# Patient Record
Sex: Female | Born: 1995 | Race: Black or African American | Hispanic: No | Marital: Single | Smoking: Never smoker
Health system: Southern US, Community
[De-identification: ages and names within clinical notes are randomized; demographics above are authoritative.]

## PROBLEM LIST (undated history)

## (undated) ENCOUNTER — Ambulatory Visit: Admission: EM | Payer: Managed Care, Other (non HMO) | Source: Home / Self Care

## (undated) ENCOUNTER — Inpatient Hospital Stay (HOSPITAL_COMMUNITY): Payer: Self-pay

## (undated) ENCOUNTER — Ambulatory Visit: Admission: EM | Source: Home / Self Care

## (undated) DIAGNOSIS — J45909 Unspecified asthma, uncomplicated: Secondary | ICD-10-CM

## (undated) DIAGNOSIS — N189 Chronic kidney disease, unspecified: Secondary | ICD-10-CM

## (undated) DIAGNOSIS — I10 Essential (primary) hypertension: Secondary | ICD-10-CM

## (undated) HISTORY — PX: TONSILLECTOMY: SUR1361

## (undated) HISTORY — DX: Essential (primary) hypertension: I10

---
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
## 2015-08-05 ENCOUNTER — Other Ambulatory Visit: Payer: Self-pay | Admitting: Family

## 2015-08-05 DIAGNOSIS — R102 Pelvic and perineal pain: Secondary | ICD-10-CM

## 2015-08-10 ENCOUNTER — Other Ambulatory Visit: Payer: Medicaid Other

## 2016-03-10 ENCOUNTER — Encounter (HOSPITAL_COMMUNITY): Payer: Self-pay | Admitting: Emergency Medicine

## 2016-03-10 ENCOUNTER — Emergency Department (HOSPITAL_COMMUNITY)
Admission: EM | Admit: 2016-03-10 | Discharge: 2016-03-10 | Disposition: A | Discharge status: Home / Self Care | Payer: Medicaid Other | Attending: Emergency Medicine | Admitting: Emergency Medicine

## 2016-03-10 ENCOUNTER — Emergency Department (HOSPITAL_COMMUNITY): Payer: Medicaid Other

## 2016-03-10 DIAGNOSIS — R000 Tachycardia, unspecified: Secondary | ICD-10-CM | POA: Insufficient documentation

## 2016-03-10 DIAGNOSIS — R062 Wheezing: Secondary | ICD-10-CM | POA: Insufficient documentation

## 2016-03-10 DIAGNOSIS — R05 Cough: Secondary | ICD-10-CM | POA: Insufficient documentation

## 2016-03-10 DIAGNOSIS — R0602 Shortness of breath: Secondary | ICD-10-CM | POA: Insufficient documentation

## 2016-03-10 LAB — D-DIMER, QUANTITATIVE (NOT AT ARMC)

## 2016-03-10 MED ORDER — ALBUTEROL SULFATE (2.5 MG/3ML) 0.083% IN NEBU
5.0000 mg | INHALATION_SOLUTION | Freq: Once | RESPIRATORY_TRACT | Status: AC
  Administered 2016-03-10: 5 mg via RESPIRATORY_TRACT
  Filled 2016-03-10: qty 6

## 2016-03-10 MED ORDER — ALBUTEROL SULFATE HFA 108 (90 BASE) MCG/ACT IN AERS: 2.0000 | INHALATION_SPRAY | RESPIRATORY_TRACT | Status: DC | PRN

## 2016-03-10 MED ORDER — METOCLOPRAMIDE HCL 5 MG/ML IJ SOLN
10.0000 mg | Freq: Once | INTRAMUSCULAR | Status: AC
  Administered 2016-03-10: 10 mg via INTRAVENOUS
  Filled 2016-03-10: qty 2

## 2016-03-10 MED ORDER — PREDNISONE 20 MG PO TABS: ORAL_TABLET | ORAL | Status: DC

## 2016-03-10 MED ORDER — MAGNESIUM SULFATE 2 GM/50ML IV SOLN
2.0000 g | Freq: Once | INTRAVENOUS | Status: AC
  Administered 2016-03-10: 2 g via INTRAVENOUS
  Filled 2016-03-10: qty 50

## 2016-03-10 MED ORDER — METHYLPREDNISOLONE SODIUM SUCC 125 MG IJ SOLR
125.0000 mg | Freq: Once | INTRAMUSCULAR | Status: AC
  Administered 2016-03-10: 125 mg via INTRAVENOUS
  Filled 2016-03-10: qty 2

## 2016-03-10 MED ORDER — ALBUTEROL SULFATE HFA 108 (90 BASE) MCG/ACT IN AERS
2.0000 | INHALATION_SPRAY | Freq: Once | RESPIRATORY_TRACT | Status: AC
  Administered 2016-03-10: 2 via RESPIRATORY_TRACT
  Filled 2016-03-10: qty 6.7

## 2016-03-10 MED ORDER — IPRATROPIUM BROMIDE 0.02 % IN SOLN
1.0000 mg | Freq: Once | RESPIRATORY_TRACT | Status: AC
  Administered 2016-03-10: 1 mg via RESPIRATORY_TRACT
  Filled 2016-03-10: qty 5

## 2016-03-10 MED ORDER — ALBUTEROL (5 MG/ML) CONTINUOUS INHALATION SOLN
15.0000 mg/h | INHALATION_SOLUTION | Freq: Once | RESPIRATORY_TRACT | Status: AC
  Administered 2016-03-10: 15 mg/h via RESPIRATORY_TRACT
  Filled 2016-03-10: qty 20

## 2016-03-10 MED ORDER — KETOROLAC TROMETHAMINE 30 MG/ML IJ SOLN
30.0000 mg | Freq: Once | INTRAMUSCULAR | Status: AC
  Administered 2016-03-10: 30 mg via INTRAVENOUS
  Filled 2016-03-10: qty 1

## 2016-03-10 MED ORDER — SODIUM CHLORIDE 0.9 % IV BOLUS (SEPSIS)
1000.0000 mL | Freq: Once | INTRAVENOUS | Status: AC
  Administered 2016-03-10: 1000 mL via INTRAVENOUS

## 2016-03-10 NOTE — ED Notes (Addendum)
Pt ambulated to the BR with steady gait.  Pt states she feels a little better.  Wheezing is no longer audible.

## 2016-03-10 NOTE — ED Provider Notes (Signed)
CSN: [id number]     Arrival date & time 03/10/16  1325 History   First MD Initiated Contact with Patient 03/10/16 1520     Chief Complaint  Patient presents with  . Wheezing  . Shortness of Breath  . Cough     (Consider location/radiation/quality/duration/timing/severity/associated sxs/prior Treatment) Patient is a 20 y.o. female presenting with wheezing, shortness of breath, and cough. The history is provided by the patient.  Wheezing Severity:  Moderate Severity compared to prior episodes:  Unable to specify Onset quality:  Gradual Duration:  1 day Timing:  Constant Progression:  Worsening Chronicity:  New Context: pollens   Relieved by:  None tried (improved with vicks chest rub) Worsened by:  Nothing tried Ineffective treatments:  None tried Associated symptoms: cough and shortness of breath   Associated symptoms: no fever   Shortness of Breath Associated symptoms: cough and wheezing   Associated symptoms: no fever   Cough Associated symptoms: shortness of breath and wheezing   Associated symptoms: no chills and no fever     History reviewed. No pertinent past medical history. History reviewed. No pertinent past surgical history. History reviewed. No pertinent family history. Social History  Substance Use Topics  . Smoking status: None  . Smokeless tobacco: None  . Alcohol Use: No   OB History    No data available     Review of Systems  Constitutional: Negative for fever and chills.  Respiratory: Positive for cough, shortness of breath and wheezing.   Endocrine: Negative for polydipsia and polyuria.  All other systems reviewed and are negative.     Allergies  Review of patient's allergies indicates no known allergies.  Home Medications   Prior to Admission medications   Medication Sig Start Date End Date Taking? Authorizing Provider  albuterol (PROVENTIL HFA;VENTOLIN HFA) 108 (90 Base) MCG/ACT inhaler Inhale 2 puffs into the lungs every 4 (four)  hours as needed for wheezing or shortness of breath. 03/10/16   Marily MemosJason Donald Jacque, MD  predniSONE (DELTASONE) 20 MG tablet 2 tabs po daily x 4 days 03/11/16   Marily MemosJason Tiara Maultsby, MD   BP 139/97 mmHg  Pulse 111  Temp(Src) 97.8 F (36.6 C) (Oral)  Resp 18  SpO2 93%  LMP 03/06/2016 Physical Exam  Constitutional: She is oriented to person, place, and time. She appears well-developed and well-nourished. She appears distressed.  HENT:  Head: Normocephalic and atraumatic.  Neck: Normal range of motion.  Cardiovascular: Normal rate and regular rhythm.   Pulmonary/Chest: No stridor. Tachypnea noted. She is in respiratory distress (mild). She has decreased breath sounds. She has wheezes.  Abdominal: Soft. She exhibits no distension. There is no tenderness.  Neurological: She is alert and oriented to person, place, and time.  Skin: Skin is warm and dry.  Nursing note and vitals reviewed.   ED Course  Procedures (including critical care time)  CRITICAL CARE Performed by: Marily MemosMesner, Michael Ventresca  Total critical care time: 35 minutes Critical care time was exclusive of separately billable procedures and treating other patients. Critical care was necessary to treat or prevent imminent or life-threatening deterioration. Critical care was time spent personally by me on the following activities: development of treatment plan with patient and/or surrogate as well as nursing, discussions with consultants, evaluation of patient's response to treatment, examination of patient, obtaining history from patient or surrogate, ordering and performing treatments and interventions, ordering and review of laboratory studies, ordering and review of radiographic studies, pulse oximetry and re-evaluation of patient's condition.   Labs  Review Labs Reviewed  D-DIMER, QUANTITATIVE (NOT AT [facility])    Imaging Review Dg Chest 2 View  03/10/2016  CLINICAL DATA:  Shortness of breath and cough EXAM: CHEST  2 VIEW COMPARISON:  11/11/2012  FINDINGS: Normal heart size and mediastinal contours. No acute infiltrate or edema. No effusion or pneumothorax. No acute osseous findings. IMPRESSION: Negative chest. Electronically Signed   By: Marnee Spring M.D.   On: 03/10/2016 14:12   I have personally reviewed and evaluated these images and lab results as part of my medical decision-making.   EKG Interpretation   Date/Time:  Thursday March 10 2016 14:58:17 EDT Ventricular Rate:  105 PR Interval:  151 QRS Duration: 79 QT Interval:  324 QTC Calculation: 428 R Axis:   49 Text Interpretation:  Sinus tachycardia Probable left atrial enlargement  Borderline T wave abnormalities Baseline wander in lead(s) V2 Confirmed by  [facility] MD, Barbara Cower [phone]) on 03/10/2016 3:59:05 PM      MDM   Final diagnoses:  Wheezing   Significant wheezing, decreased BS throughout, slightly tachycardic and tachypneic, mild distress.  Will stasrt with continuous albuterol, mag, steroids, ipratropium and observation for reassessment to consider discharge/admission.   On reevaluation was subjectively improved, moving much better air, still with wheezing. On review of visiti had transient hypoxia to 89 after breathing treatments, but mostly in mid to low 90's.   Albuterol inhaler ordered. Will dc to fu w/ pcp for asthma testing. Steroid burst and albuterol rx.   New Prescriptions: New Prescriptions   ALBUTEROL (PROVENTIL HFA;VENTOLIN HFA) 108 (90 BASE) MCG/ACT INHALER    Inhale 2 puffs into the lungs every 4 (four) hours as needed for wheezing or shortness of breath.   PREDNISONE (DELTASONE) 20 MG TABLET    2 tabs po daily x 4 days     I have personally and contemperaneously reviewed labs and imaging and used in my decision making as above.   A medical screening exam was performed and I feel the patient has had an appropriate workup for their chief complaint at this time and likelihood of emergent condition existing is low. Their vital signs are stable.  They have been counseled on decision, discharge, follow up and which symptoms necessitate immediate return to the emergency department.  They verbally stated understanding and agreement with plan and discharged in stable condition.    Marily Memos, MD 03/10/16 1944

## 2016-03-10 NOTE — ED Notes (Signed)
Pt reports severe SOB and cough since last night.  States she smokes marijuana but denies smoking cigarettes.  Pt denies hx of asthma.  Audible expiratory wheezing noted.  Pt is A&Ox 4.  Pt reports SOB is worse with exertion.

## 2016-03-10 NOTE — ED Notes (Signed)
Pt began with productive cough yesterday worsening into a wheezing and SOB today. States she feels like she can't catch her breath in triage, inspiratory and expiratory wheezing in all lung fields, O2 93% on room air. Pt restless. States her chest does hurt after coughing so much.

## 2016-03-10 NOTE — ED Notes (Signed)
Pt reports vomiting after using the BR.  Denies feeling nauseous at this time.

## 2016-04-17 IMAGING — CR DG CHEST 2V
2 series · 2 of 2 positions shown · non-contrast
Comparison: 11/11/2012

CLINICAL DATA: Shortness of breath and cough

EXAM:
CHEST  2 VIEW

[w chest pa]
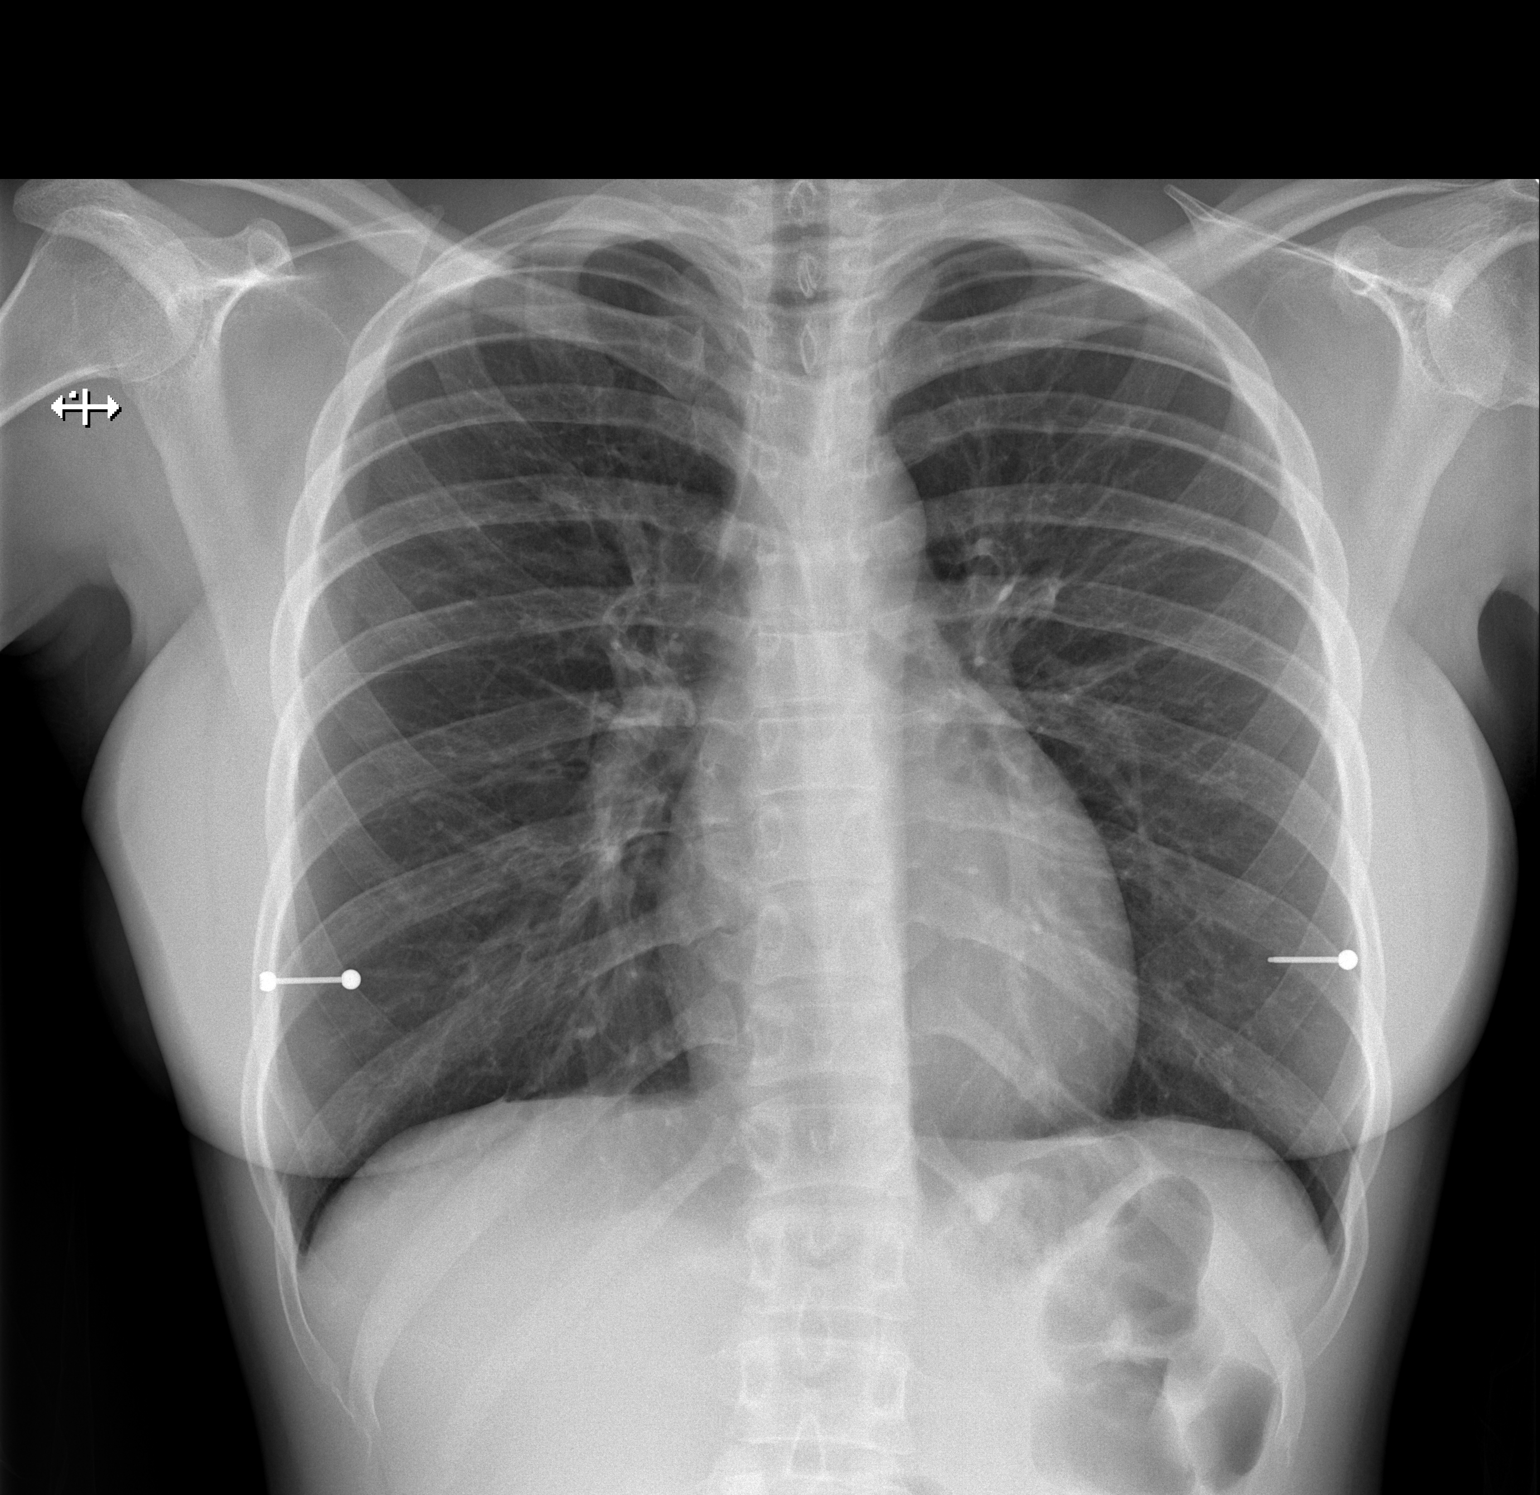

[w chest lat]
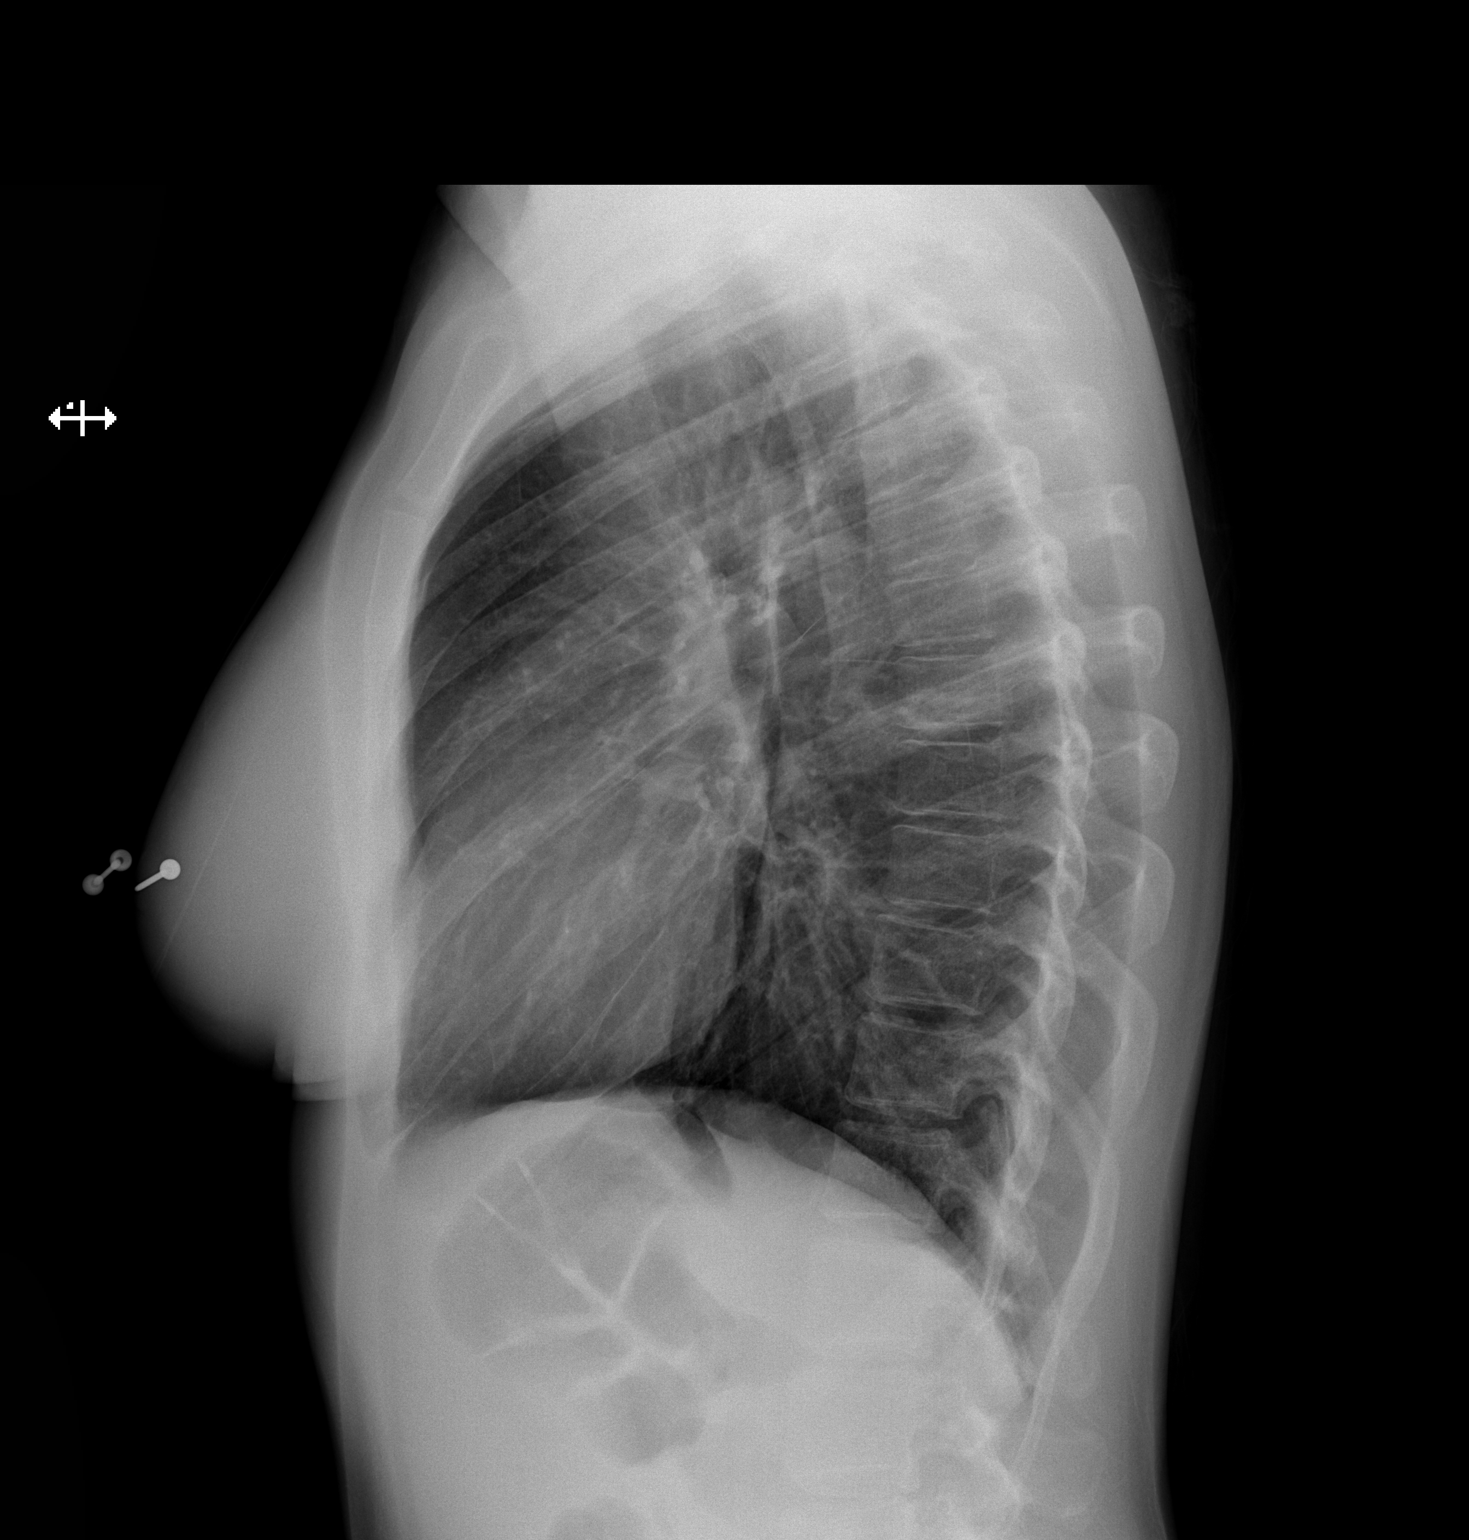

[2 of 2 positions shown; findings below may reference images not displayed]

FINDINGS: Normal heart size and mediastinal contours. No acute infiltrate or
edema. No effusion or pneumothorax. No acute osseous findings.
IMPRESSION: Negative chest.

## 2016-11-15 ENCOUNTER — Emergency Department (HOSPITAL_COMMUNITY)
Admission: EM | Admit: 2016-11-15 | Discharge: 2016-11-15 | Disposition: A | Discharge status: Home / Self Care | Payer: No Typology Code available for payment source | Attending: Emergency Medicine | Admitting: Emergency Medicine

## 2016-11-15 ENCOUNTER — Encounter (HOSPITAL_COMMUNITY): Payer: Self-pay | Admitting: Emergency Medicine

## 2016-11-15 DIAGNOSIS — Y939 Activity, unspecified: Secondary | ICD-10-CM | POA: Diagnosis not present

## 2016-11-15 DIAGNOSIS — Y999 Unspecified external cause status: Secondary | ICD-10-CM | POA: Diagnosis not present

## 2016-11-15 DIAGNOSIS — S3992XA Unspecified injury of lower back, initial encounter: Secondary | ICD-10-CM | POA: Diagnosis present

## 2016-11-15 DIAGNOSIS — S39012A Strain of muscle, fascia and tendon of lower back, initial encounter: Secondary | ICD-10-CM | POA: Diagnosis not present

## 2016-11-15 DIAGNOSIS — Y92410 Unspecified street and highway as the place of occurrence of the external cause: Secondary | ICD-10-CM | POA: Diagnosis not present

## 2016-11-15 MED ORDER — NAPROXEN 500 MG PO TABS: 500.0000 mg | ORAL_TABLET | Freq: Two times a day (BID) | ORAL | 0 refills | Status: DC

## 2016-11-15 MED ORDER — LIDOCAINE 5 % EX PTCH: 1.0000 | MEDICATED_PATCH | CUTANEOUS | 0 refills | Status: DC

## 2016-11-15 MED ORDER — METHOCARBAMOL 500 MG PO TABS: 500.0000 mg | ORAL_TABLET | Freq: Two times a day (BID) | ORAL | 0 refills | Status: DC
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
## 2016-11-15 NOTE — Discharge Instructions (Signed)
Expect your soreness to increase over the next 2-3 days. Take it easy, but do not lay around too much as this may make any stiffness worse. Take 500 mg of naproxen every 12 hours or 800 mg of ibuprofen every 8 hours for the next 3 days. Take these medications with food to avoid upset stomach. Robaxin is a muscle relaxer and may help loosen stiff muscles. Do not take the Robaxin while driving or performing other dangerous activities. Be sure to perform the attached exercises starting with three times a week and working up to performing them daily. This is an essential part of preventing long term problems. Follow up with a primary care provider for any future management of these complaints. °

## 2017-11-30 ENCOUNTER — Encounter (HOSPITAL_COMMUNITY): Payer: Self-pay | Admitting: Emergency Medicine

## 2017-11-30 ENCOUNTER — Emergency Department (HOSPITAL_COMMUNITY)
Admission: EM | Admit: 2017-11-30 | Discharge: 2017-11-30 | Disposition: A | Discharge status: Home / Self Care | Payer: Self-pay | Attending: Emergency Medicine | Admitting: Emergency Medicine

## 2017-11-30 DIAGNOSIS — R5383 Other fatigue: Secondary | ICD-10-CM | POA: Insufficient documentation

## 2017-11-30 DIAGNOSIS — R946 Abnormal results of thyroid function studies: Secondary | ICD-10-CM | POA: Insufficient documentation

## 2017-11-30 DIAGNOSIS — R7989 Other specified abnormal findings of blood chemistry: Secondary | ICD-10-CM

## 2017-11-30 LAB — CBC WITH DIFFERENTIAL/PLATELET
Basophils Absolute: 0 10*3/uL (ref 0.0–0.1)
Basophils Relative: 0 %
Eosinophils Absolute: 0.3 10*3/uL (ref 0.0–0.7)
Eosinophils Relative: 4 %
HEMATOCRIT: 37.4 % (ref 36.0–46.0)
Hemoglobin: 12.4 g/dL (ref 12.0–15.0)
LYMPHS ABS: 1 10*3/uL (ref 0.7–4.0)
LYMPHS PCT: 15 %
MCH: 30.5 pg (ref 26.0–34.0)
MCHC: 33.2 g/dL (ref 30.0–36.0)
MCV: 91.9 fL (ref 78.0–100.0)
MONO ABS: 0.3 10*3/uL (ref 0.1–1.0)
MONOS PCT: 4 %
Neutro Abs: 5.1 10*3/uL (ref 1.7–7.7)
Neutrophils Relative %: 77 %
Platelets: 179 10*3/uL (ref 150–400)
RBC: 4.07 MIL/uL (ref 3.87–5.11)
RDW: 12.7 % (ref 11.5–15.5)
WBC: 6.7 10*3/uL (ref 4.0–10.5)

## 2017-11-30 LAB — I-STAT CHEM 8, ED
BUN: 12 mg/dL (ref 6–20)
CHLORIDE: 108 mmol/L (ref 101–111)
Calcium, Ion: 1.19 mmol/L (ref 1.15–1.40)
Creatinine, Ser: 0.8 mg/dL (ref 0.44–1.00)
Glucose, Bld: 85 mg/dL (ref 65–99)
HCT: 37 % (ref 36.0–46.0)
Hemoglobin: 12.6 g/dL (ref 12.0–15.0)
Potassium: 3.8 mmol/L (ref 3.5–5.1)
SODIUM: 143 mmol/L (ref 135–145)
TCO2: 22 mmol/L (ref 22–32)

## 2017-11-30 LAB — I-STAT BETA HCG BLOOD, ED (MC, WL, AP ONLY): I-stat hCG, quantitative: <5 m[IU]/mL (ref <5)

## 2017-11-30 LAB — TSH: TSH: 0.184 u[IU]/mL — ABNORMAL LOW (ref 0.350–4.500)

## 2017-11-30 NOTE — Discharge Instructions (Signed)
Your blood tests are all within normal except for a thyroid test called TSH. Yours is low at 0.184.  Please follow-up with a family doctor for further evaluation of possible thyroid disease and treatment.  Return if worsening symptoms

## 2017-11-30 NOTE — ED Provider Notes (Signed)
[facility]-EMERGENCY DEPT Provider Note   CSN: [id number] Arrival date & time: 11/30/17  0757     History   Chief Complaint Chief Complaint  Patient presents with  . Fatigue    HPI Molly Burton is a 22 y.o. female.  HPI Molly Burton is a 22 y.o. female presents to emergency department complaining of fatigue, loss of appetite, weakness.  Patient states she has been feeling fatigued for the last week.  She states that she has been sleeping all day long.  She states she has tried to call out of work yesterday.  She states she states in bed all day.  She reports dizziness when she gets up.  She reports loss of appetite and has been not eating.  She states she does not feel like drinking fluids.  She states she feels like she may be dehydrated.  She states she has never had any blood work done and would like to be checked for anemia and diabetes.  She states she normally goes to student health, however it is closed for the holidays.  Denies any nausea or vomiting.  No headache.  No chest pain or abdominal pain.  Denies any urinary symptoms.  She is currently on her menstrual cycle, does not believe that she could be pregnant.  She denies heavy periods.  Denies any recent illnesses.  History reviewed. No pertinent past medical history.  There are no active problems to display for this patient.   History reviewed. No pertinent surgical history.  OB History    No data available       Home Medications    Prior to Admission medications   Medication Sig Start Date End Date Taking? Authorizing Provider  albuterol (PROVENTIL HFA;VENTOLIN HFA) 108 (90 Base) MCG/ACT inhaler Inhale 2 puffs into the lungs every 4 (four) hours as needed for wheezing or shortness of breath. 03/10/16   Mesner, Barbara CowerJason, MD  lidocaine (LIDODERM) 5 % Place 1 patch onto the skin daily. Remove & Discard patch within 12 hours or as directed by MD 11/15/16   Joy, Shawn C, PA-C  methocarbamol  (ROBAXIN) 500 MG tablet Take 1 tablet (500 mg total) by mouth 2 (two) times daily. 11/15/16   Joy, Shawn C, PA-C  naproxen (NAPROSYN) 500 MG tablet Take 1 tablet (500 mg total) by mouth 2 (two) times daily. 11/15/16   Joy, Shawn C, PA-C  predniSONE (DELTASONE) 20 MG tablet 2 tabs po daily x 4 days 03/11/16   Mesner, Barbara CowerJason, MD    Family History No family history on file.  Social History Social History   Tobacco Use  . Smoking status: Current Every Day Smoker    Types: Cigarettes  . Smokeless tobacco: Never Used  Substance Use Topics  . Alcohol use: No  . Drug use: No     Allergies   Patient has no known allergies.   Review of Systems Review of Systems  Constitutional: Positive for appetite change and fatigue. Negative for chills and fever.  Respiratory: Negative for cough, chest tightness and shortness of breath.   Cardiovascular: Negative for chest pain, palpitations and leg swelling.  Gastrointestinal: Negative for abdominal pain, diarrhea, nausea and vomiting.  Genitourinary: Positive for vaginal bleeding. Negative for dysuria, flank pain, pelvic pain, vaginal discharge and vaginal pain.  Musculoskeletal: Negative for arthralgias, myalgias, neck pain and neck stiffness.  Skin: Negative for rash.  Neurological: Positive for weakness. Negative for dizziness and headaches.  All other systems reviewed and  are negative.    Physical Exam Updated Vital Signs BP 130/87 (BP Location: Left Arm)   Pulse 79   Temp 98.6 F (37 C) (Oral)   Resp 16   Ht 5\' 2"  (1.575 m)   LMP 11/29/2017   SpO2 100%   Physical Exam  Constitutional: She is oriented to person, place, and time. She appears well-developed and well-nourished. No distress.  HENT:  Head: Normocephalic.  Eyes: Conjunctivae are normal.  Neck: Neck supple.  Cardiovascular: Normal rate, regular rhythm and normal heart sounds.  Pulmonary/Chest: Effort normal and breath sounds normal. No respiratory distress. She has no  wheezes. She has no rales.  Abdominal: Soft. Bowel sounds are normal. She exhibits no distension. There is no tenderness. There is no rebound.  Musculoskeletal: She exhibits no edema.  Neurological: She is alert and oriented to person, place, and time.  Skin: Skin is warm and dry.  Psychiatric: She has a normal mood and affect. Her behavior is normal.  Nursing note and vitals reviewed.    ED Treatments / Results  Labs (all labs ordered are listed, but only abnormal results are displayed) Labs Reviewed  CBC WITH DIFFERENTIAL/PLATELET  TSH  I-STAT BETA HCG BLOOD, ED ([facility], [facility], [facility] ONLY)  I-STAT CHEM 8, ED    EKG  EKG Interpretation None       Radiology No results found.  Procedures Procedures (including critical care time)  Medications Ordered in ED Medications - No data to display   Initial Impression / Assessment and Plan / ED Course  I have reviewed the triage vital signs and the nursing notes.  Pertinent labs & imaging results that were available during my care of the patient were reviewed by me and considered in my medical decision making (see chart for details).     Patient in emergency department with fatigue, no energy, states had to call out of work.  Exam unremarkable.  Vital signs all within normal.  Labs obtained, showing normal CBC, normal Chem-8, not pregnant.  I did obtain TSH which came back low at 0.184.  Most consistent with possible hyperthyroidism which I guess could be causing her fatigue, however I would expect the opposite effect.  At this time I do not think she needs any emergent workup or treatment, and I will have her follow-up with a family doctor.  I discussed results with patient who agreed.  Return precautions discussed.  Vitals:   11/30/17 0803 11/30/17 0804  BP: 130/87   Pulse: 79   Resp: 16   Temp:  98.6 F (37 C)  TempSrc:  Oral  SpO2: 100%   Height:  5\' 2"  (1.575 m)     Final Clinical Impressions(s) / ED Diagnoses   Final  diagnoses:  Low TSH level  Fatigue, unspecified type    ED Discharge Orders    None       Jaynie Crumble, PA-C 11/30/17 1610    Shaune Pollack, MD 12/01/17 [phone]

## 2017-11-30 NOTE — ED Triage Notes (Signed)
Patient reports feels tired over past week even when she sleeps more, reports "the more tired I feel". Denies any urinary, n/v/d or cough.

## 2018-01-18 ENCOUNTER — Telehealth: Payer: Self-pay | Admitting: Endocrinology

## 2018-01-18 ENCOUNTER — Ambulatory Visit: Payer: Self-pay | Admitting: Endocrinology

## 2018-01-18 NOTE — Telephone Encounter (Signed)
I can't make any statement without seeing pt

## 2018-01-18 NOTE — Telephone Encounter (Signed)
I called LVM that determination could not be made since patient was not seen today.

## 2018-01-18 NOTE — Telephone Encounter (Signed)
Pt states that she was put on light duty by PCP. She missed her appt today and had to reschedule. And would like to know if our dr can take her off of light duty so she can return to work    Please advise

## 2018-01-29 ENCOUNTER — Ambulatory Visit: Payer: Self-pay | Admitting: Internal Medicine

## 2018-02-08 ENCOUNTER — Ambulatory Visit (INDEPENDENT_AMBULATORY_CARE_PROVIDER_SITE_OTHER): Payer: Self-pay | Admitting: Endocrinology

## 2018-02-08 ENCOUNTER — Encounter: Payer: Self-pay | Admitting: Endocrinology

## 2018-02-08 DIAGNOSIS — E0590 Thyrotoxicosis, unspecified without thyrotoxic crisis or storm: Secondary | ICD-10-CM

## 2018-02-08 DIAGNOSIS — M2550 Pain in unspecified joint: Secondary | ICD-10-CM

## 2018-02-08 LAB — T4, FREE: Free T4: 0.84 ng/dL (ref 0.60–1.60)

## 2018-02-08 LAB — TSH: TSH: 2.14 u[IU]/mL (ref 0.35–4.50)

## 2018-02-08 NOTE — Progress Notes (Signed)
Subjective:    Patient ID: Molly Burton, female    DOB: Oct 09, 1996, 22 y.o.   MRN: [id number]  HPI Pt is referred by Worthy RancherPadonda Webb, FNP, for hyperthyroidism.  Pt reports he was dx'ed with hyperthyroidism in early 2019.  she has never been on therapy for this.  she has never had XRT to the anterior neck, or thyroid surgery.  she has never had thyroid imaging.  she does not consume kelp or any other non-prescribed thyroid medication.  she has never been on amiodarone.  Pt states few months of moderate fatigue, and assoc muscle weakness.  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    Past Medical History:  Diagnosis Date  . Arthralgia     No past surgical history on file.  Social History   Socioeconomic History  . Marital status: Single    Spouse name: Not on file  . Number of children: Not on file  . Years of education: Not on file  . Highest education level: Not on file  Social Needs  . Financial resource strain: Not on file  . Food insecurity - worry: Not on file  . Food insecurity - inability: Not on file  . Transportation needs - medical: Not on file  . Transportation needs - non-medical: Not on file  Occupational History  . Not on file  Tobacco Use  . Smoking status: Current Every Day Smoker    Types: Cigarettes  . Smokeless tobacco: Never Used  Substance and Sexual Activity  . Alcohol use: No  . Drug use: No  . Sexual activity: Not on file  Other Topics Concern  . Not on file  Social History Narrative  . Not on file    Current Outpatient Medications on File Prior to Visit  Medication Sig Dispense Refill  . lidocaine (LIDODERM) 5 % Place 1 patch onto the skin daily. Remove & Discard patch within 12 hours or as directed by MD 30 patch 0  . naproxen (NAPROSYN) 500 MG tablet Take 1 tablet (500 mg total) by mouth 2 (two) times daily. 30 tablet 0   No current facility-administered medications on file prior to visit.     No Known  Allergies  Family History  Problem Relation Age of Onset  . Thyroid disease Maternal Grandmother   . Thyroid disease Paternal Grandmother     BP 120/70 (BP Location: Left Arm, Patient Position: Sitting, Cuff Size: Normal)   Pulse 93   Wt 124 lb (56.2 kg)   SpO2 94%   BMI 22.68 kg/m    Review of Systems denies weight loss, headache, hoarseness, visual loss, palpitations, sob, diarrhea, polyuria, arthralgias, edema, she has tremor, anxiety, heat intolerance, easy bruising, rhinorrhea and excessive diaphoresis.       Objective:   Physical Exam VS: see vs page GEN: no distress HEAD: head: no deformity eyes: no periorbital swelling, no proptosis external nose and ears are normal mouth: no lesion seen NECK: thyroid is slightly enlarged, with irreg surface.  CHEST WALL: no deformity LUNGS: clear to auscultation CV: reg rate and rhythm, no murmur ABD: abdomen is soft, nontender.  no hepatosplenomegaly.  not distended.  no hernia MUSCULOSKELETAL: muscle bulk and strength are grossly normal.  no obvious joint swelling.  gait is normal and steady EXTEMITIES: no deformity.  no edema PULSES: no carotid bruit NEURO:  cn 2-12 grossly intact.   readily moves all 4's.  sensation is intact to touch on all  4's SKIN:  Normal texture and temperature.  No rash or suspicious lesion is visible.  Slightly diaphoretic NODES:  None palpable at the neck PSYCH: alert, well-oriented.  Does not appear anxious nor depressed.     Lab Results  Component Value Date   TSH 0.184 (L) 11/30/2017   outside test results are reviewed: TSH=0.57 (also 11/30/17)  I have reviewed outside records, and summarized: Pt was noted to have suppressed TSH, and referred here.  She had a number of sxs, but fatigue was the worst.      Assessment & Plan:  Hyperthyroidism, new.  She should have recheck before any rx, as it was mild.   prob due to thyroiditis.  Diaphoresis: not thyroid-related.   Patient Instructions    blood tests are requested for you today.  We'll let you know about the results. If it is overactive, i'll send a prescription to slow it down If it is normal, we should still follow, because it will become abnormal again with time.

## 2018-02-08 NOTE — Patient Instructions (Signed)
blood tests are requested for you today.  We'll let you know about the results. If it is overactive, i'll send a prescription to slow it down If it is normal, we should still follow, because it will become abnormal again with time.

## 2018-02-10 ENCOUNTER — Encounter: Payer: Self-pay | Admitting: Endocrinology

## 2018-02-10 DIAGNOSIS — M2550 Pain in unspecified joint: Secondary | ICD-10-CM | POA: Insufficient documentation

## 2018-02-15 ENCOUNTER — Ambulatory Visit (INDEPENDENT_AMBULATORY_CARE_PROVIDER_SITE_OTHER): Payer: Self-pay | Admitting: Endocrinology

## 2018-02-15 ENCOUNTER — Encounter: Payer: Self-pay | Admitting: Endocrinology

## 2018-02-15 VITALS — BP 102/70 | HR 70 | Wt 125.6 lb

## 2018-02-15 DIAGNOSIS — E0590 Thyrotoxicosis, unspecified without thyrotoxic crisis or storm: Secondary | ICD-10-CM

## 2018-02-15 NOTE — Progress Notes (Signed)
Subjective:    Patient ID: Molly Burton, female    DOB: [birth year], 22 y.o.   MRN: [id number]  HPI Pt returns for f/u of mildhyperthyroidism (dx'ed with hyperthyroidism in early 2019, but repeat was normal; she has never been on therapy for this; she has never had thyroid imaging).  Pt is here with her mother, who says she still has fatigue and excessive diaphoresis.   Past Medical History:  Diagnosis Date  . Arthralgia     History reviewed. No pertinent surgical history.  Social History   Socioeconomic History  . Marital status: Single    Spouse name: Not on file  . Number of children: Not on file  . Years of education: Not on file  . Highest education level: Not on file  Occupational History  . Not on file  Social Needs  . Financial resource strain: Not on file  . Food insecurity:    Worry: Not on file    Inability: Not on file  . Transportation needs:    Medical: Not on file    Non-medical: Not on file  Tobacco Use  . Smoking status: Current Every Day Smoker    Types: Cigarettes  . Smokeless tobacco: Never Used  Substance and Sexual Activity  . Alcohol use: No  . Drug use: No  . Sexual activity: Not on file  Lifestyle  . Physical activity:    Days per week: Not on file    Minutes per session: Not on file  . Stress: Not on file  Relationships  . Social connections:    Talks on phone: Not on file    Gets together: Not on file    Attends religious service: Not on file    Active member of club or organization: Not on file    Attends meetings of clubs or organizations: Not on file    Relationship status: Not on file  . Intimate partner violence:    Fear of current or ex partner: Not on file    Emotionally abused: Not on file    Physically abused: Not on file    Forced sexual activity: Not on file  Other Topics Concern  . Not on file  Social History Narrative  . Not on file    Current Outpatient Medications on File Prior to Visit  Medication Sig  Dispense Refill  . lidocaine (LIDODERM) 5 % Place 1 patch onto the skin daily. Remove & Discard patch within 12 hours or as directed by MD (Patient not taking: Reported on 02/15/2018) 30 patch 0  . naproxen (NAPROSYN) 500 MG tablet Take 1 tablet (500 mg total) by mouth 2 (two) times daily. (Patient not taking: Reported on 02/15/2018) 30 tablet 0   No current facility-administered medications on file prior to visit.     No Known Allergies  Family History  Problem Relation Age of Onset  . Thyroid disease Maternal Grandmother   . Thyroid disease Paternal Grandmother     BP 102/70 (BP Location: Left Arm, Patient Position: Sitting, Cuff Size: Normal)   Pulse 70   Wt 125 lb 9.6 oz (57 kg)   SpO2 97%   BMI 22.97 kg/m   Review of Systems Denies fever.     Objective:   Physical Exam VITAL SIGNS:  See vs page GENERAL: no distress NECK: thyroid is slightly and diffusely enlarged, but no palpable nodule.         Assessment & Plan:  Mild hyperthyroidism, resolved, but she has risk  for recurrence.  Diaphoresis and other sxs.  I told pt these are not thyroid-related.   Patient Instructions  Your current symptoms are not coming from the thyroid.  However, you are at risk for the thyroid becoming abnormal in the future, so you should have it checked twice a yer for 2 years, then once a year after that. I would be happy to see you back here as needed

## 2018-02-15 NOTE — Patient Instructions (Signed)
Your current symptoms are not coming from the thyroid.  However, you are at risk for the thyroid becoming abnormal in the future, so you should have it checked twice a yer for 2 years, then once a year after that. I would be happy to see you back here as needed

## 2018-03-01 ENCOUNTER — Ambulatory Visit: Payer: Self-pay | Admitting: Endocrinology

## 2018-03-01 DIAGNOSIS — Z0289 Encounter for other administrative examinations: Secondary | ICD-10-CM

## 2018-05-02 ENCOUNTER — Other Ambulatory Visit: Payer: Self-pay

## 2018-07-05 ENCOUNTER — Other Ambulatory Visit: Payer: Self-pay

## 2018-07-05 ENCOUNTER — Encounter (HOSPITAL_COMMUNITY): Payer: Self-pay

## 2018-07-05 ENCOUNTER — Emergency Department (HOSPITAL_COMMUNITY)
Admission: EM | Admit: 2018-07-05 | Discharge: 2018-07-05 | Disposition: A | Discharge status: Home / Self Care | Payer: Medicaid Other | Attending: Emergency Medicine | Admitting: Emergency Medicine

## 2018-07-05 ENCOUNTER — Emergency Department (HOSPITAL_COMMUNITY): Payer: Medicaid Other

## 2018-07-05 DIAGNOSIS — N8312 Corpus luteum cyst of left ovary: Secondary | ICD-10-CM | POA: Diagnosis not present

## 2018-07-05 DIAGNOSIS — O26891 Other specified pregnancy related conditions, first trimester: Secondary | ICD-10-CM | POA: Diagnosis present

## 2018-07-05 DIAGNOSIS — O2341 Unspecified infection of urinary tract in pregnancy, first trimester: Secondary | ICD-10-CM

## 2018-07-05 DIAGNOSIS — R109 Unspecified abdominal pain: Secondary | ICD-10-CM

## 2018-07-05 DIAGNOSIS — Z3A00 Weeks of gestation of pregnancy not specified: Secondary | ICD-10-CM | POA: Diagnosis not present

## 2018-07-05 DIAGNOSIS — O9989 Other specified diseases and conditions complicating pregnancy, childbirth and the puerperium: Secondary | ICD-10-CM | POA: Insufficient documentation

## 2018-07-05 DIAGNOSIS — N831 Corpus luteum cyst of ovary, unspecified side: Secondary | ICD-10-CM

## 2018-07-05 LAB — URINALYSIS, ROUTINE W REFLEX MICROSCOPIC
BILIRUBIN URINE: NEGATIVE
Glucose, UA: NEGATIVE mg/dL
Ketones, ur: 5 mg/dL — AB
NITRITE: NEGATIVE
PH: 5 (ref 5.0–8.0)
Protein, ur: 100 mg/dL — AB
Specific Gravity, Urine: 1.018 (ref 1.005–1.030)
WBC, UA: >50 WBC/hpf — ABNORMAL HIGH (ref 0–5)

## 2018-07-05 LAB — I-STAT BETA HCG BLOOD, ED (MC, WL, AP ONLY): I-stat hCG, quantitative: >2000 m[IU]/mL — ABNORMAL HIGH (ref <5)

## 2018-07-05 MED ORDER — CEPHALEXIN 500 MG PO CAPS
500.0000 mg | ORAL_CAPSULE | Freq: Once | ORAL | Status: AC
  Administered 2018-07-05: 500 mg via ORAL
  Filled 2018-07-05: qty 1

## 2018-07-05 MED ORDER — CEPHALEXIN 500 MG PO CAPS: 500.0000 mg | ORAL_CAPSULE | Freq: Four times a day (QID) | ORAL | 0 refills | Status: DC

## 2018-07-05 NOTE — ED Provider Notes (Signed)
[facility]-EMERGENCY DEPT Provider Note   CSN: [id number] Arrival date & time: 07/05/18  1648     History   Chief Complaint Chief Complaint  Patient presents with  . Possible Pregnancy  . Urinary Tract Infection    HPI Molly Burton is a 22 y.o. female G1P0 with positive HPT here tonight with c/o UTI symptoms. Patient reports frequency and pressure. Patient reports seeing her PCP a couple weeks ago and treated for UTI with Macrobid and was feeling a little better but after stopping the medication the symptoms returned in just a couple days.   HPI  Past Medical History:  Diagnosis Date  . Arthralgia     Patient Active Problem List   Diagnosis Date Noted  . Arthralgia   . Hyperthyroidism 02/08/2018    History reviewed. No pertinent surgical history.   OB History    Gravida  1   Para      Term      Preterm      AB      Living        SAB      TAB      Ectopic      Multiple      Live Births               Home Medications    Prior to Admission medications   Medication Sig Start Date End Date Taking? Authorizing Provider  cephALEXin (KEFLEX) 500 MG capsule Take 1 capsule (500 mg total) by mouth 4 (four) times daily. 07/05/18   Janne NapoleonNeese, Hope M, NP    Family History Family History  Problem Relation Age of Onset  . Thyroid disease Maternal Grandmother   . Thyroid disease Paternal Grandmother     Social History Social History   Tobacco Use  . Smoking status: Never Smoker  . Smokeless tobacco: Never Used  Substance Use Topics  . Alcohol use: Yes    Comment: socially  . Drug use: Yes    Types: Marijuana     Allergies   Patient has no known allergies.   Review of Systems Review of Systems  Genitourinary: Positive for frequency. Negative for vaginal bleeding and vaginal discharge.       Pressure with urination   All other systems reviewed and are negative.    Physical Exam Updated Vital Signs BP 105/75 (BP  Location: Right Arm)   Pulse 61   Temp 98.6 F (37 C)   Resp 14   Ht 5\' 3"  (1.6 m)   Wt 54.4 kg   LMP 11/29/2017   SpO2 100%   BMI 21.26 kg/m   Physical Exam  Constitutional: She appears well-developed and well-nourished. No distress.  HENT:  Head: Normocephalic.  Eyes: Conjunctivae and EOM are normal.  Neck: Neck supple.  Cardiovascular: Bradycardia present.  Pulmonary/Chest: Effort normal.  Abdominal: Soft. There is no tenderness. There is no CVA tenderness.  Genitourinary:  Genitourinary Comments: Not done, patient without pain or bleeding. Scheduled with OB next week.   Musculoskeletal: Normal range of motion.  Neurological: She is alert.  Skin: Skin is warm and dry.  Psychiatric: She has a normal mood and affect. Her behavior is normal.  Nursing note and vitals reviewed.    ED Treatments / Results  Labs (all labs ordered are listed, but only abnormal results are displayed) Labs Reviewed  URINALYSIS, ROUTINE W REFLEX MICROSCOPIC - Abnormal; Notable for the following components:      Result Value  APPearance CLOUDY (*)    Hgb urine dipstick MODERATE (*)    Ketones, ur 5 (*)    Protein, ur 100 (*)    Leukocytes, UA LARGE (*)    WBC, UA >50 (*)    Bacteria, UA RARE (*)    All other components within normal limits  I-STAT BETA HCG BLOOD, ED ([facility], [facility], [facility] ONLY) - Abnormal; Notable for the following components:   I-stat hCG, quantitative >2,000.0 (*)    All other components within normal limits  URINE CULTURE   Radiology: 6 week 1 day IUGS with YS, CLC left.   Procedures Procedures (including critical care time)  Medications Ordered in ED Medications  cephALEXin (KEFLEX) capsule 500 mg (500 mg Oral Given 07/05/18 2036)     Initial Impression / Assessment and Plan / ED Course  I have reviewed the triage vital signs and the nursing notes. Pt has been diagnosed with a UTI in first trimester pregnancy. Urine sent for culture. Pt is afebrile, no CVA tenderness,  normotensive, and denies N/V. Pt to be dc home with antibiotics and instructions to follow up with her OB as scheduled. Instructed to go to [facility] sooner for problems.   Final Clinical Impressions(s) / ED Diagnoses   Final diagnoses:  Abdominal pain during pregnancy in first trimester  UTI in pregnancy, antepartum, first trimester  Cystic corpus luteum    ED Discharge Orders         Ordered    cephALEXin (KEFLEX) 500 MG capsule  4 times daily     07/05/18 2030           Kerrie Buffalo [place], [place] 07/05/18 2038    Pricilla Loveless, MD 07/06/18 [phone]

## 2018-07-05 NOTE — ED Triage Notes (Addendum)
Pt states that she has had a positive urine pregnancy test, and wants to confirm with bloodwork- estimated date of conception 06/08/18 . Pt also states she thinks that she has a UTI- frequency.

## 2018-07-05 NOTE — Discharge Instructions (Addendum)
We have sent your urine for culture. If we need to change your medication someone will call you. Your ultrasound tonight shows that you are 6 weeks and 1 day pregnant. Keep your follow up appointment [facility]. If you have any problems before then go to [facility] for further evaluation.

## 2018-07-09 ENCOUNTER — Inpatient Hospital Stay (HOSPITAL_COMMUNITY)
Admission: AD | Admit: 2018-07-09 | Discharge: 2018-07-09 | Disposition: A | Discharge status: Home / Self Care | Payer: Medicaid Other | Source: Ambulatory Visit | Attending: Obstetrics and Gynecology | Admitting: Obstetrics and Gynecology

## 2018-07-09 ENCOUNTER — Encounter (HOSPITAL_COMMUNITY): Payer: Self-pay | Admitting: *Deleted

## 2018-07-09 DIAGNOSIS — N189 Chronic kidney disease, unspecified: Secondary | ICD-10-CM | POA: Insufficient documentation

## 2018-07-09 DIAGNOSIS — O26831 Pregnancy related renal disease, first trimester: Secondary | ICD-10-CM | POA: Diagnosis not present

## 2018-07-09 DIAGNOSIS — O210 Mild hyperemesis gravidarum: Secondary | ICD-10-CM | POA: Diagnosis present

## 2018-07-09 DIAGNOSIS — Z3A01 Less than 8 weeks gestation of pregnancy: Secondary | ICD-10-CM | POA: Diagnosis not present

## 2018-07-09 DIAGNOSIS — R109 Unspecified abdominal pain: Secondary | ICD-10-CM | POA: Insufficient documentation

## 2018-07-09 DIAGNOSIS — O219 Vomiting of pregnancy, unspecified: Secondary | ICD-10-CM

## 2018-07-09 DIAGNOSIS — O26891 Other specified pregnancy related conditions, first trimester: Secondary | ICD-10-CM | POA: Insufficient documentation

## 2018-07-09 DIAGNOSIS — O2341 Unspecified infection of urinary tract in pregnancy, first trimester: Secondary | ICD-10-CM | POA: Diagnosis not present

## 2018-07-09 HISTORY — DX: Chronic kidney disease, unspecified: N18.9

## 2018-07-09 LAB — URINALYSIS, ROUTINE W REFLEX MICROSCOPIC
BILIRUBIN URINE: NEGATIVE
GLUCOSE, UA: NEGATIVE mg/dL
HGB URINE DIPSTICK: NEGATIVE
KETONES UR: 80 mg/dL — AB
LEUKOCYTES UA: NEGATIVE
NITRITE: NEGATIVE
PROTEIN: 100 mg/dL — AB
Specific Gravity, Urine: 1.032 — ABNORMAL HIGH (ref 1.005–1.030)
pH: 5 (ref 5.0–8.0)

## 2018-07-09 MED ORDER — RANITIDINE HCL 150 MG PO TABS: 150.0000 mg | ORAL_TABLET | Freq: Two times a day (BID) | ORAL | 0 refills | Status: DC

## 2018-07-09 MED ORDER — METOCLOPRAMIDE HCL 10 MG PO TABS: 10.0000 mg | ORAL_TABLET | Freq: Four times a day (QID) | ORAL | 0 refills | Status: DC

## 2018-07-09 MED ORDER — FAMOTIDINE IN NACL 20-0.9 MG/50ML-% IV SOLN
20.0000 mg | Freq: Once | INTRAVENOUS | Status: AC
  Administered 2018-07-09: 20 mg via INTRAVENOUS
  Filled 2018-07-09: qty 50

## 2018-07-09 MED ORDER — PROMETHAZINE HCL 25 MG/ML IJ SOLN
25.0000 mg | Freq: Once | INTRAVENOUS | Status: AC
  Administered 2018-07-09: 999 mg via INTRAVENOUS
  Filled 2018-07-09: qty 1

## 2018-07-09 MED ORDER — PROMETHAZINE HCL 25 MG RE SUPP: 25.0000 mg | Freq: Every evening | RECTAL | 1 refills | Status: DC | PRN

## 2018-07-09 MED ORDER — M.V.I. ADULT IV INJ
Freq: Once | INTRAVENOUS | Status: AC
  Administered 2018-07-09: 14:00:00 via INTRAVENOUS
  Filled 2018-07-09: qty 1000

## 2018-07-09 NOTE — MAU Provider Note (Signed)
History     CSN: [id number]  Arrival date and time: 07/09/18 1021   First Provider Initiated Contact with Patient 07/09/18 1149      Chief Complaint  Patient presents with  . Emesis  . Abdominal Pain   HPI  Molly Burton is a 22 y.o. African American G1P0 female at [id number] complaining of vomiting for 4 days. She says she feels dehydrated and is having difficulty keeping down food. She has not taken anything for nausea. She reports occasional cramping pelvic pain, but no other abdominal pain. She denies diarrhea, vaginal bleeding, discharge, fever, chills, or other complaints.  OB History    Gravida  1   Para      Term      Preterm      AB      Living        SAB      TAB      Ectopic      Multiple      Live Births              Past Medical History:  Diagnosis Date  . Chronic kidney disease    UTI    Past Surgical History:  Procedure Laterality Date  . TONSILLECTOMY      Family History  Problem Relation Age of Onset  . Thyroid disease Maternal Grandmother   . Thyroid disease Paternal Grandmother     Social History   Tobacco Use  . Smoking status: Never Smoker  . Smokeless tobacco: Never Used  Substance Use Topics  . Alcohol use: Not Currently    Comment: last in july before finding out pregant  . Drug use: Yes    Types: Marijuana    Comment: not since 07/03/18    Allergies: No Known Allergies  Medications Prior to Admission  Medication Sig Dispense Refill Last Dose  . cephALEXin (KEFLEX) 500 MG capsule Take 1 capsule (500 mg total) by mouth 4 (four) times daily. 28 capsule 0     Review of Systems  Constitutional: Positive for fatigue. Negative for chills and fever.  Respiratory: Negative for chest tightness and shortness of breath.   Cardiovascular: Negative for chest pain and palpitations.  Gastrointestinal: Positive for nausea and vomiting. Negative for abdominal distention, constipation and diarrhea.  Genitourinary: Positive for  pelvic pain. Negative for decreased urine volume and difficulty urinating.   Physical Exam   Blood pressure 119/82, pulse 64, temperature 98.3 F (36.8 C), temperature source Oral, resp. rate 16, weight 52.2 kg, last menstrual period 04/30/2018.  Physical Exam  Constitutional: She is oriented to person, place, and time. She appears well-developed and well-nourished. No distress.  HENT:  Head: Normocephalic and atraumatic.  Cardiovascular: Normal rate and regular rhythm.  Respiratory: Effort normal and breath sounds normal. No respiratory distress. She has no wheezes. She has no rales. She exhibits no tenderness.  GI: Soft. Bowel sounds are normal. She exhibits no distension and no mass. There is no tenderness. There is no rebound and no guarding.  Musculoskeletal: Normal range of motion.  Neurological: She is alert and oriented to person, place, and time.  Skin: Skin is warm and dry.  Psychiatric: She has a normal mood and affect. Her behavior is normal.    MAU Course  Procedures   Assessment and Plan  Vomiting due to normal pregnancy  - Phenergan, lactated ringers 1 L, famotidine  - Prescription for phenergan and reglan  - Return for worsening or new symptoms  Dianne DunHarrison D  Kamile Fassler 07/09/2018, 12:51 PM

## 2018-07-09 NOTE — Discharge Instructions (Signed)

## 2018-07-09 NOTE — MAU Note (Signed)
Pt states she hasn't eaten in 4 days, vomiting, no diarrhea. Has held down some liquids today.   Is on antibiotic for UTI, not sure she is holding the medication down.  Lower abd cramping since last week.  Denies bleeding.

## 2018-07-09 NOTE — MAU Provider Note (Signed)
History     CSN: [id number]  Arrival date and time: 07/09/18 1021   First Provider Initiated Contact with Patient 07/09/18 1149      Chief Complaint  Patient presents with  . Emesis  . Abdominal Pain   HPI  Ms.  Molly Burton is a 22 y.o. year old G1P0 female at 4353w5d weeks gestation by LMP who presents to MAU reporting she hasn't eaten in 4 days, N/V, but is able to hold down fluids today. She is currently taking medication for an UTI and concerned that she is able to keep it down. She reports that the smell of different foods causes her to get nauseated and she starts vomiting.  Past Medical History:  Diagnosis Date  . Chronic kidney disease    UTI    Past Surgical History:  Procedure Laterality Date  . TONSILLECTOMY      Family History  Problem Relation Age of Onset  . Thyroid disease Maternal Grandmother   . Thyroid disease Paternal Grandmother     Social History   Tobacco Use  . Smoking status: Never Smoker  . Smokeless tobacco: Never Used  Substance Use Topics  . Alcohol use: Not Currently    Comment: last in july before finding out pregant  . Drug use: Yes    Types: Marijuana    Comment: not since 07/03/18    Allergies: No Known Allergies  Medications Prior to Admission  Medication Sig Dispense Refill Last Dose  . cephALEXin (KEFLEX) 500 MG capsule Take 1 capsule (500 mg total) by mouth 4 (four) times daily. 28 capsule 0     Review of Systems  Constitutional: Positive for appetite change.  HENT: Negative.   Eyes: Negative.   Respiratory: Negative.   Cardiovascular: Negative.   Gastrointestinal: Positive for abdominal pain, nausea and vomiting.  Endocrine: Negative.   Genitourinary: Negative.   Musculoskeletal: Negative.   Skin: Negative.   Allergic/Immunologic: Negative.   Neurological: Negative.   Hematological: Negative.   Psychiatric/Behavioral: Negative.    Physical Exam   Blood pressure 119/82, pulse 64, temperature 98.3 F (36.8  C), temperature source Oral, resp. rate 16, weight 52.2 kg, last menstrual period 04/30/2018.  Physical Exam  Nursing note and vitals reviewed. Constitutional: She is oriented to person, place, and time. She appears well-developed and well-nourished.  HENT:  Head: Normocephalic and atraumatic.  Eyes: Pupils are equal, round, and reactive to light.  Neck: Normal range of motion.  Cardiovascular: Normal rate, regular rhythm and normal heart sounds.  Respiratory: Effort normal and breath sounds normal.  GI: Soft. Bowel sounds are normal.  Genitourinary:  Genitourinary Comments: deferred  Musculoskeletal: Normal range of motion.  Neurological: She is alert and oriented to person, place, and time.  Skin: Skin is warm and dry.  Psychiatric: She has a normal mood and affect. Her behavior is normal. Judgment and thought content normal.    MAU Course  Procedures  MDM CCUA IVFs: Phenergan 25 mg in LR 1000 ml @ bolus rate; then MVI in LR 1000 ml @ 500 ml/hr  Results for orders placed or performed during the hospital encounter of 07/09/18 (from the past 24 hour(s))  Urinalysis, Routine w reflex microscopic     Status: Abnormal   Collection Time: 07/09/18 10:36 AM  Result Value Ref Range   Color, Urine YELLOW YELLOW   APPearance HAZY (A) CLEAR   Specific Gravity, Urine 1.032 (H) 1.005 - 1.030   pH 5.0 5.0 - 8.0   Glucose,  UA NEGATIVE NEGATIVE mg/dL   Hgb urine dipstick NEGATIVE NEGATIVE   Bilirubin Urine NEGATIVE NEGATIVE   Ketones, ur 80 (A) NEGATIVE mg/dL   Protein, ur 540100 (A) NEGATIVE mg/dL   Nitrite NEGATIVE NEGATIVE   Leukocytes, UA NEGATIVE NEGATIVE   RBC / HPF 0-5 0 - 5 RBC/hpf   WBC, UA 0-5 0 - 5 WBC/hpf   Bacteria, UA RARE (A) NONE SEEN   Squamous Epithelial / LPF 0-5 0 - 5   Mucus PRESENT      Assessment and Plan  Nausea/vomiting in pregnancy - Plan: Discharge patient - Rx for Phenergan 25 mg suppositories pv hs prn N/V - Rx for Zantac 150 mg BID - Rx for Reglan 10  mg every 6 hrs prn N/V - Information provided on N/V, HG & eating plan for HG - Discharge home - [facility]  - Patient verbalized an understanding of the plan of care and agrees.   Raelyn Moraolitta Samuele Storey, MSN, CNM 07/09/2018, 11:49 AM

## 2018-07-09 NOTE — MAU Note (Signed)
Pt called, not in lobby 

## 2018-07-13 ENCOUNTER — Other Ambulatory Visit: Payer: Self-pay | Admitting: Obstetrics & Gynecology

## 2018-07-13 DIAGNOSIS — O3680X0 Pregnancy with inconclusive fetal viability, not applicable or unspecified: Secondary | ICD-10-CM

## 2018-07-16 ENCOUNTER — Ambulatory Visit (INDEPENDENT_AMBULATORY_CARE_PROVIDER_SITE_OTHER): Payer: Medicaid Other

## 2018-07-16 DIAGNOSIS — O3680X0 Pregnancy with inconclusive fetal viability, not applicable or unspecified: Secondary | ICD-10-CM

## 2018-07-16 DIAGNOSIS — O219 Vomiting of pregnancy, unspecified: Secondary | ICD-10-CM

## 2018-07-16 NOTE — Progress Notes (Signed)
US 7 wks single IUP w/ys,positive fht 144 bpm,normal ovaries bilat,left simple corpus luteal cyst 2.6 x 1.8 x 2.8 cm,crl 9.95 mm

## 2018-07-18 ENCOUNTER — Encounter: Payer: Self-pay | Admitting: Adult Health

## 2018-07-23 ENCOUNTER — Telehealth: Payer: Self-pay | Admitting: *Deleted

## 2018-07-23 NOTE — Telephone Encounter (Signed)
Patient's mother requesting prescription for PNV.  Informed that we have not seen patient before and will not be able to PNV. Informed any over the counter vitamin will be fine.  Verbalized understanding.

## 2018-07-31 ENCOUNTER — Ambulatory Visit: Payer: Self-pay | Admitting: *Deleted

## 2018-08-01 ENCOUNTER — Ambulatory Visit (INDEPENDENT_AMBULATORY_CARE_PROVIDER_SITE_OTHER): Payer: Medicaid Other | Admitting: Women's Health

## 2018-08-01 ENCOUNTER — Encounter: Payer: Self-pay | Admitting: Women's Health

## 2018-08-01 ENCOUNTER — Ambulatory Visit: Payer: Medicaid Other | Admitting: *Deleted

## 2018-08-01 VITALS — BP 131/88 | HR 85 | Wt 122.0 lb

## 2018-08-01 DIAGNOSIS — Z331 Pregnant state, incidental: Secondary | ICD-10-CM

## 2018-08-01 DIAGNOSIS — E0590 Thyrotoxicosis, unspecified without thyrotoxic crisis or storm: Secondary | ICD-10-CM

## 2018-08-01 DIAGNOSIS — Z1389 Encounter for screening for other disorder: Secondary | ICD-10-CM

## 2018-08-01 DIAGNOSIS — Z3401 Encounter for supervision of normal first pregnancy, first trimester: Secondary | ICD-10-CM | POA: Diagnosis not present

## 2018-08-01 DIAGNOSIS — Z3A09 9 weeks gestation of pregnancy: Secondary | ICD-10-CM

## 2018-08-01 DIAGNOSIS — Z3682 Encounter for antenatal screening for nuchal translucency: Secondary | ICD-10-CM

## 2018-08-01 DIAGNOSIS — Z3400 Encounter for supervision of normal first pregnancy, unspecified trimester: Secondary | ICD-10-CM | POA: Insufficient documentation

## 2018-08-01 DIAGNOSIS — Z8639 Personal history of other endocrine, nutritional and metabolic disease: Secondary | ICD-10-CM

## 2018-08-01 LAB — POCT URINALYSIS DIPSTICK OB
Blood, UA: NEGATIVE
GLUCOSE, UA: NEGATIVE
KETONES UA: NEGATIVE
Leukocytes, UA: NEGATIVE
Nitrite, UA: NEGATIVE
POC,PROTEIN,UA: NEGATIVE

## 2018-08-01 NOTE — Progress Notes (Signed)
INITIAL OBSTETRICAL VISIT Patient name: Molly Burton MRN [id number]  Date of birth: Feb 25, 1996 Chief Complaint:   Initial Prenatal Visit  History of Present Illness:   Molly Burton is a 22 y.o. G1P0 African American female at [redacted]w[redacted]d by 7wk u/s, with an Estimated Date of Delivery: 03/04/19 being seen today for her initial obstetrical visit.   Her obstetrical history is significant for primigravida.   Today she reports no complaints.  H/o hyperthyroidism, never on meds, last check levels were normal Patient's last menstrual period was 04/28/2018. Last pap never. Results were: n/a, wants to do pap next visit Review of Systems:   Pertinent items are noted in HPI Denies cramping/contractions, leakage of fluid, vaginal bleeding, abnormal vaginal discharge w/ itching/odor/irritation, headaches, visual changes, shortness of breath, chest pain, abdominal pain, severe nausea/vomiting, or problems with urination or bowel movements unless otherwise stated above.  Pertinent History Reviewed:  Reviewed past medical,surgical, social, obstetrical and family history.  Reviewed problem list, medications and allergies. OB History  Gravida Para Term Preterm AB Living  1            SAB TAB Ectopic Multiple Live Births               # Outcome Date GA Lbr Len/2nd Weight Sex Delivery Anes PTL Lv  1 Current            Physical Assessment:   Vitals:   08/01/18 1440  BP: 131/88  Pulse: 85  Weight: 122 lb (55.3 kg)  Body mass index is 21.61 kg/m.       Physical Examination:  General appearance - well appearing, and in no distress  Mental status - alert, oriented to person, place, and time  Psych:  She has a normal mood and affect  Skin - warm and dry, normal color, no suspicious lesions noted  Chest - effort normal, all lung fields clear to auscultation bilaterally  Heart - normal rate and regular rhythm  Abdomen - soft, nontender  Extremities:  No swelling or varicosities noted  Thin prep  pap is not done   Fetal Heart Rate (bpm): +u/s via informal transabdominal u/s  Results for orders placed or performed in visit on 08/01/18 (from the past 24 hour(s))  POC Urinalysis Dipstick OB   Collection Time: 08/01/18  2:40 PM  Result Value Ref Range   Color, UA     Clarity, UA     Glucose, UA Negative Negative   Bilirubin, UA     Ketones, UA neg    Spec Grav, UA     Blood, UA neg    pH, UA     POC Protein UA Negative Negative, Trace   Urobilinogen, UA     Nitrite, UA neg    Leukocytes, UA Negative Negative   Appearance     Odor      Assessment & Plan:  1) Low-Risk Pregnancy G1P0 at [redacted]w[redacted]d with an Estimated Date of Delivery: 03/04/19   2) Initial OB visit  3) H/O hyperthyroidism> check labs today  Meds: No orders of the defined types were placed in this encounter.   Initial labs obtained Continue prenatal vitamins Reviewed n/v relief measures and warning s/s to report Reviewed recommended weight gain based on pre-gravid BMI Encouraged well-balanced diet Genetic Screening discussed Integrated Screen: requested Cystic fibrosis screening discussed declined Ultrasound discussed; fetal survey: requested CCNC completed>PCM not here  Follow-up: Return in about 4 weeks (around 08/29/2018) for LROB w/ pap, US:NT+1stIT.  Orders Placed This Encounter  Procedures  . GC/Chlamydia Probe Amp  . Urine Culture  . US Fetal Nuchal Translucency Measurement  . Urinalysis, Routine w reflex microscopic  . Obstetric Panel, Including HIV  . Sickle cell screen  . Pain Management Screening Profile (10S)  . TSH  . T4, free  . POC Urinalysis Dipstick OB    Cheral Marker CNM, [facility] 08/01/2018 3:05 PM

## 2018-08-01 NOTE — Patient Instructions (Signed)
Molly Burton, I greatly value your feedback.  If you receive a survey following your visit with [name] today, we appreciate you taking the time to fill it out.  Thanks, [name], CNM, WHNP-BC   Nausea & Vomiting  Have saltine crackers or pretzels by your bed and eat a few bites before you raise your head out of bed in the morning  Eat small frequent meals throughout the day instead of large meals  Drink plenty of fluids throughout the day to stay hydrated, just don't drink a lot of fluids with your meals.  This can make your stomach fill up faster making you feel sick  Do not brush your teeth right after you eat  Products with real ginger are good for nausea, like ginger ale and ginger hard candy Make sure it says made with real ginger!  Sucking on sour candy like lemon heads is also good for nausea  If your prenatal vitamins make you nauseated, take them at night so you will sleep through the nausea  Sea Bands  If you feel like you need medicine for the nausea & vomiting please let us know  If you are unable to keep any fluids or food down please let us know   Constipation  Drink plenty of fluid, preferably water, throughout the day  Eat foods high in fiber such as fruits, vegetables, and grains  Exercise, such as walking, is a good way to keep your bowels regular  Drink warm fluids, especially warm prune juice, or decaf coffee  Eat a 1/2 cup of real oatmeal (not instant), 1/2 cup applesauce, and 1/2-1 cup warm prune juice every day  If needed, you may take Colace (docusate sodium) stool softener once or twice a day to help keep the stool soft. If you are pregnant, wait until you are out of your first trimester (12-14 weeks of pregnancy)  If you still are having problems with constipation, you may take Miralax once daily as needed to help keep your bowels regular.  If you are pregnant, wait until you are out of your first trimester (12-14 weeks of pregnancy)   First  Trimester of Pregnancy The first trimester of pregnancy is from week 1 until the end of week 12 (months 1 through 3). A week after a sperm fertilizes an egg, the egg will implant on the wall of the uterus. This embryo will begin to develop into a baby. Genes from you and your partner are forming the baby. The female genes determine whether the baby is a boy or a girl. At 6-8 weeks, the eyes and face are formed, and the heartbeat can be seen on ultrasound. At the end of 12 weeks, all the baby's organs are formed.  Now that you are pregnant, you will want to do everything you can to have a healthy baby. Two of the most important things are to get good prenatal care and to follow your health care provider's instructions. Prenatal care is all the medical care you receive before the baby's birth. This care will help prevent, find, and treat any problems during the pregnancy and childbirth. BODY CHANGES Your body goes through many changes during pregnancy. The changes vary from woman to woman.   You may gain or lose a couple of pounds at first.  You may feel sick to your stomach (nauseous) and throw up (vomit). If the vomiting is uncontrollable, call your health care provider.  You may tire easily.  You may develop headaches  that can be relieved by medicines approved by your health care provider.  You may urinate more often. Painful urination may mean you have a bladder infection.  You may develop heartburn as a result of your pregnancy.  You may develop constipation because certain hormones are causing the muscles that push waste through your intestines to slow down.  You may develop hemorrhoids or swollen, bulging veins (varicose veins).  Your breasts may begin to grow larger and become tender. Your nipples may stick out more, and the tissue that surrounds them (areola) may become darker.  Your gums may bleed and may be sensitive to brushing and flossing.  Dark spots or blotches (chloasma, mask  of pregnancy) may develop on your face. This will likely fade after the baby is born.  Your menstrual periods will stop.  You may have a loss of appetite.  You may develop cravings for certain kinds of food.  You may have changes in your emotions from day to day, such as being excited to be pregnant or being concerned that something may go wrong with the pregnancy and baby.  You may have more vivid and strange dreams.  You may have changes in your hair. These can include thickening of your hair, rapid growth, and changes in texture. Some women also have hair loss during or after pregnancy, or hair that feels dry or thin. Your hair will most likely return to normal after your baby is born. WHAT TO EXPECT AT YOUR PRENATAL VISITS During a routine prenatal visit:  You will be weighed to make sure you and the baby are growing normally.  Your blood pressure will be taken.  Your abdomen will be measured to track your baby's growth.  The fetal heartbeat will be listened to starting around week 10 or 12 of your pregnancy.  Test results from any previous visits will be discussed. Your health care provider may ask you:  How you are feeling.  If you are feeling the baby move.  If you have had any abnormal symptoms, such as leaking fluid, bleeding, severe headaches, or abdominal cramping.  If you have any questions. Other tests that may be performed during your first trimester include:  Blood tests to find your blood type and to check for the presence of any previous infections. They will also be used to check for low iron levels (anemia) and Rh antibodies. Later in the pregnancy, blood tests for diabetes will be done along with other tests if problems develop.  Urine tests to check for infections, diabetes, or protein in the urine.  An ultrasound to confirm the proper growth and development of the baby.  An amniocentesis to check for possible genetic problems.  Fetal screens for spina  bifida and Down syndrome.  You may need other tests to make sure you and the baby are doing well. HOME CARE INSTRUCTIONS  Medicines  Follow your health care provider's instructions regarding medicine use. Specific medicines may be either safe or unsafe to take during pregnancy.  Take your prenatal vitamins as directed.  If you develop constipation, try taking a stool softener if your health care provider approves. Diet  Eat regular, well-balanced meals. Choose a variety of foods, such as meat or vegetable-based protein, fish, milk and low-fat dairy products, vegetables, fruits, and whole grain breads and cereals. Your health care provider will help you determine the amount of weight gain that is right for you.  Avoid raw meat and uncooked cheese. These carry germs that can  cause birth defects in the baby.  Eating four or five small meals rather than three large meals a day may help relieve nausea and vomiting. If you start to feel nauseous, eating a few soda crackers can be helpful. Drinking liquids between meals instead of during meals also seems to help nausea and vomiting.  If you develop constipation, eat more high-fiber foods, such as fresh vegetables or fruit and whole grains. Drink enough fluids to keep your urine clear or pale yellow. Activity and Exercise  Exercise only as directed by your health care provider. Exercising will help you:  Control your weight.  Stay in shape.  Be prepared for labor and delivery.  Experiencing pain or cramping in the lower abdomen or low back is a good sign that you should stop exercising. Check with your health care provider before continuing normal exercises.  Try to avoid standing for long periods of time. Move your legs often if you must stand in one place for a long time.  Avoid heavy lifting.  Wear low-heeled shoes, and practice good posture.  You may continue to have sex unless your health care provider directs you  otherwise. Relief of Pain or Discomfort  Wear a good support bra for breast tenderness.   Take warm sitz baths to soothe any pain or discomfort caused by hemorrhoids. Use hemorrhoid cream if your health care provider approves.   Rest with your legs elevated if you have leg cramps or low back pain.  If you develop varicose veins in your legs, wear support hose. Elevate your feet for 15 minutes, 3-4 times a day. Limit salt in your diet. Prenatal Care  Schedule your prenatal visits by the twelfth week of pregnancy. They are usually scheduled monthly at first, then more often in the last 2 months before delivery.  Write down your questions. Take them to your prenatal visits.  Keep all your prenatal visits as directed by your health care provider. Safety  Wear your seat belt at all times when driving.  Make a list of emergency phone numbers, including numbers for family, friends, the hospital, and police and fire departments. General Tips  Ask your health care provider for a referral to a local prenatal education class. Begin classes no later than at the beginning of month 6 of your pregnancy.  Ask for help if you have counseling or nutritional needs during pregnancy. Your health care provider can offer advice or refer you to specialists for help with various needs.  Do not use hot tubs, steam rooms, or saunas.  Do not douche or use tampons or scented sanitary pads.  Do not cross your legs for long periods of time.  Avoid cat litter boxes and soil used by cats. These carry germs that can cause birth defects in the baby and possibly loss of the fetus by miscarriage or stillbirth.  Avoid all smoking, herbs, alcohol, and medicines not prescribed by your health care provider. Chemicals in these affect the formation and growth of the baby.  Schedule a dentist appointment. At home, brush your teeth with a soft toothbrush and be gentle when you floss. SEEK MEDICAL CARE IF:   You have  dizziness.  You have mild pelvic cramps, pelvic pressure, or nagging pain in the abdominal area.  You have persistent nausea, vomiting, or diarrhea.  You have a bad smelling vaginal discharge.  You have pain with urination.  You notice increased swelling in your face, hands, legs, or ankles. SEEK IMMEDIATE MEDICAL CARE IF:  You have a fever.  You are leaking fluid from your vagina.  You have spotting or bleeding from your vagina.  You have severe abdominal cramping or pain.  You have rapid weight gain or loss.  You vomit blood or material that looks like coffee grounds.  You are exposed to [name] measles and have never had them.  You are exposed to fifth disease or chickenpox.  You develop a severe headache.  You have shortness of breath.  You have any kind of trauma, such as from a fall or a car accident. Document Released: 11/08/2001 Document Revised: 03/31/2014 Document Reviewed: 09/24/2013 [facility] Patient Information 2015 [place], [place]. This information is not intended to replace advice given to you by your health care provider. Make sure you discuss any questions you have with your health care provider.

## 2018-08-02 LAB — OBSTETRIC PANEL, INCLUDING HIV
ANTIBODY SCREEN: NEGATIVE
Basophils Absolute: 0 10*3/uL (ref 0.0–0.2)
Basos: 0 %
EOS (ABSOLUTE): 0.4 10*3/uL (ref 0.0–0.4)
EOS: 3 %
HEMOGLOBIN: 12.7 g/dL (ref 11.1–15.9)
HIV SCREEN 4TH GENERATION: NONREACTIVE
Hematocrit: 37.1 % (ref 34.0–46.6)
Hepatitis B Surface Ag: NEGATIVE
IMMATURE GRANULOCYTES: 0 %
Immature Grans (Abs): 0 10*3/uL (ref 0.0–0.1)
LYMPHS ABS: 1.2 10*3/uL (ref 0.7–3.1)
Lymphs: 10 %
MCH: 30.5 pg (ref 26.6–33.0)
MCHC: 34.2 g/dL (ref 31.5–35.7)
MCV: 89 fL (ref 79–97)
MONOS ABS: 0.8 10*3/uL (ref 0.1–0.9)
Monocytes: 7 %
NEUTROS PCT: 80 %
Neutrophils Absolute: 9.2 10*3/uL — ABNORMAL HIGH (ref 1.4–7.0)
Platelets: 236 10*3/uL (ref 150–450)
RBC: 4.16 x10E6/uL (ref 3.77–5.28)
RDW: 12.9 % (ref 12.3–15.4)
RH TYPE: POSITIVE
RPR Ser Ql: NONREACTIVE
Rubella Antibodies, IGG: 1.64 index (ref >0.99)
WBC: 11.6 10*3/uL — AB (ref 3.4–10.8)

## 2018-08-02 LAB — URINALYSIS, ROUTINE W REFLEX MICROSCOPIC
Bilirubin, UA: NEGATIVE
GLUCOSE, UA: NEGATIVE
Ketones, UA: NEGATIVE
Leukocytes, UA: NEGATIVE
NITRITE UA: NEGATIVE
Protein, UA: NEGATIVE
RBC, UA: NEGATIVE
Specific Gravity, UA: 1.021 (ref 1.005–1.030)
Urobilinogen, Ur: 0.2 mg/dL (ref 0.2–1.0)
pH, UA: 5.5 (ref 5.0–7.5)

## 2018-08-02 LAB — PMP SCREEN PROFILE (10S), URINE
Amphetamine Scrn, Ur: NEGATIVE ng/mL
BARBITURATE SCREEN URINE: NEGATIVE ng/mL
BENZODIAZEPINE SCREEN, URINE: NEGATIVE ng/mL
CANNABINOIDS UR QL SCN: NEGATIVE ng/mL
COCAINE(METAB.)SCREEN, URINE: NEGATIVE ng/mL
Creatinine(Crt), U: 146.5 mg/dL (ref 20.0–300.0)
Methadone Screen, Urine: NEGATIVE ng/mL
OPIATE SCREEN URINE: NEGATIVE ng/mL
OXYCODONE+OXYMORPHONE UR QL SCN: NEGATIVE ng/mL
Ph of Urine: 6 (ref 4.5–8.9)
Phencyclidine Qn, Ur: NEGATIVE ng/mL
Propoxyphene Scrn, Ur: NEGATIVE ng/mL

## 2018-08-02 LAB — SICKLE CELL SCREEN: Sickle Cell Screen: NEGATIVE

## 2018-08-02 LAB — T4, FREE: Free T4: 1.41 ng/dL (ref 0.82–1.77)

## 2018-08-02 LAB — TSH: TSH: 0.109 u[IU]/mL — ABNORMAL LOW (ref 0.450–4.500)

## 2018-08-02 LAB — GC/CHLAMYDIA PROBE AMP
CHLAMYDIA, DNA PROBE: NEGATIVE
NEISSERIA GONORRHOEAE BY PCR: NEGATIVE

## 2018-08-03 ENCOUNTER — Telehealth: Payer: Self-pay | Admitting: *Deleted

## 2018-08-03 LAB — URINE CULTURE: ORGANISM ID, BACTERIA: NO GROWTH

## 2018-08-03 NOTE — Telephone Encounter (Signed)
Patient states she decided she wants to get medicine for her thyroid.  Having little energy and feeling lightheaded at times.

## 2018-08-03 NOTE — Telephone Encounter (Signed)
Patient informed thyroid level low but unless she was symptomatic, then meds would not be prescribed at this time.  Patient states she is just tired and has little energy but just relating it to being pregnant.

## 2018-08-06 MED ORDER — CITRANATAL ASSURE 35-1 & 300 MG PO MISC: ORAL | 11 refills | Status: DC

## 2018-08-06 NOTE — Telephone Encounter (Signed)
Spoke to patient and informed her the symptoms she described were normal pregnancy sx, if she develops persistent weight loss, heat intolerance (feeling hot all the time), tremors, palpitations, etc to let us know.  Patient verbalized understanding.    Requested prescription for PNV.

## 2018-08-08 ENCOUNTER — Other Ambulatory Visit: Payer: Self-pay

## 2018-08-08 ENCOUNTER — Inpatient Hospital Stay (HOSPITAL_COMMUNITY)
Admission: AD | Admit: 2018-08-08 | Discharge: 2018-08-08 | Disposition: A | Discharge status: Home / Self Care | Payer: Medicaid Other | Source: Ambulatory Visit | Attending: Obstetrics & Gynecology | Admitting: Obstetrics & Gynecology

## 2018-08-08 ENCOUNTER — Encounter (HOSPITAL_COMMUNITY): Payer: Self-pay

## 2018-08-08 DIAGNOSIS — Z87440 Personal history of urinary (tract) infections: Secondary | ICD-10-CM | POA: Insufficient documentation

## 2018-08-08 DIAGNOSIS — O26891 Other specified pregnancy related conditions, first trimester: Secondary | ICD-10-CM | POA: Diagnosis not present

## 2018-08-08 DIAGNOSIS — Z3A10 10 weeks gestation of pregnancy: Secondary | ICD-10-CM | POA: Insufficient documentation

## 2018-08-08 DIAGNOSIS — K219 Gastro-esophageal reflux disease without esophagitis: Secondary | ICD-10-CM | POA: Insufficient documentation

## 2018-08-08 DIAGNOSIS — R109 Unspecified abdominal pain: Secondary | ICD-10-CM | POA: Diagnosis present

## 2018-08-08 DIAGNOSIS — R1010 Upper abdominal pain, unspecified: Secondary | ICD-10-CM | POA: Diagnosis not present

## 2018-08-08 DIAGNOSIS — O99611 Diseases of the digestive system complicating pregnancy, first trimester: Secondary | ICD-10-CM | POA: Insufficient documentation

## 2018-08-08 LAB — COMPREHENSIVE METABOLIC PANEL
ALBUMIN: 4 g/dL (ref 3.5–5.0)
ALT: 15 U/L (ref 0–44)
ANION GAP: 8 (ref 5–15)
AST: 16 U/L (ref 15–41)
Alkaline Phosphatase: 47 U/L (ref 38–126)
BILIRUBIN TOTAL: 0.1 mg/dL — AB (ref 0.3–1.2)
BUN: 7 mg/dL (ref 6–20)
CALCIUM: 9.6 mg/dL (ref 8.9–10.3)
CO2: 22 mmol/L (ref 22–32)
Chloride: 102 mmol/L (ref 98–111)
Creatinine, Ser: 0.57 mg/dL (ref 0.44–1.00)
Glucose, Bld: 86 mg/dL (ref 70–99)
Potassium: 3.8 mmol/L (ref 3.5–5.1)
Sodium: 132 mmol/L — ABNORMAL LOW (ref 135–145)
TOTAL PROTEIN: 6.8 g/dL (ref 6.5–8.1)

## 2018-08-08 LAB — URINALYSIS, ROUTINE W REFLEX MICROSCOPIC
BILIRUBIN URINE: NEGATIVE
GLUCOSE, UA: NEGATIVE mg/dL
HGB URINE DIPSTICK: NEGATIVE
KETONES UR: NEGATIVE mg/dL
Leukocytes, UA: NEGATIVE
Nitrite: NEGATIVE
Protein, ur: NEGATIVE mg/dL
SPECIFIC GRAVITY, URINE: 1.02 (ref 1.005–1.030)
pH: 6 (ref 5.0–8.0)

## 2018-08-08 LAB — CBC WITH DIFFERENTIAL/PLATELET
BASOS PCT: 0 %
Basophils Absolute: 0 10*3/uL (ref 0.0–0.1)
Eosinophils Absolute: 0.6 10*3/uL (ref 0.0–0.7)
Eosinophils Relative: 5 %
HEMATOCRIT: 34.2 % — AB (ref 36.0–46.0)
Hemoglobin: 11.9 g/dL — ABNORMAL LOW (ref 12.0–15.0)
LYMPHS ABS: 1.8 10*3/uL (ref 0.7–4.0)
Lymphocytes Relative: 15 %
MCH: 31.2 pg (ref 26.0–34.0)
MCHC: 34.8 g/dL (ref 30.0–36.0)
MCV: 89.5 fL (ref 78.0–100.0)
MONO ABS: 0.3 10*3/uL (ref 0.1–1.0)
MONOS PCT: 3 %
Neutro Abs: 8.9 10*3/uL — ABNORMAL HIGH (ref 1.7–7.7)
Neutrophils Relative %: 77 %
Platelets: 217 10*3/uL (ref 150–400)
RBC: 3.82 MIL/uL — ABNORMAL LOW (ref 3.87–5.11)
RDW: 12.6 % (ref 11.5–15.5)
WBC: 11.6 10*3/uL — ABNORMAL HIGH (ref 4.0–10.5)

## 2018-08-08 LAB — LIPASE, BLOOD: LIPASE: 22 U/L (ref 11–51)

## 2018-08-08 MED ORDER — GI COCKTAIL ~~LOC~~
30.0000 mL | Freq: Once | ORAL | Status: AC
  Administered 2018-08-08: 30 mL via ORAL
  Filled 2018-08-08: qty 30

## 2018-08-08 NOTE — MAU Note (Signed)
Having cramps in her lower stomach and little pains in on left side. Started this morning.   Was watching her little sister last night and she kicked her in the side about 3 times.

## 2018-08-08 NOTE — MAU Provider Note (Signed)
History     CSN: [id number]  Arrival date and time: 08/08/18 1353   First Provider Initiated Contact with Patient 08/08/18 1514      Chief Complaint  Patient presents with  . Abdominal Pain   HPI   Ms.Molly Burton is a 22 y.o. female G1P0 @ 30w2dpresenting to MAU with complaints of upper abdominal cramping. This is a new problem, the pain started this morning. The pain comes and goes. The pain feels achy. She feels like her stomach is getting tight. For breakfast she ate chicken stir fry and 2 orders of bacon. Says she eats bacon every morning and it usually does not bother her.  The pain is located in the upper half of her abdomen, the pain is in the middle of her abdomen. Says she has not taken anything for the pain OTC. No burning pain.   OB History    Gravida  1   Para      Term      Preterm      AB      Living        SAB      TAB      Ectopic      Multiple      Live Births              Past Medical History:  Diagnosis Date  . Chronic kidney disease    UTI    Past Surgical History:  Procedure Laterality Date  . TONSILLECTOMY      Family History  Problem Relation Age of Onset  . Thyroid disease Maternal Grandmother   . Thyroid disease Paternal Grandmother     Social History   Tobacco Use  . Smoking status: Never Smoker  . Smokeless tobacco: Never Used  Substance Use Topics  . Alcohol use: Not Currently    Comment: Pt did have wine on 08/05/2018  . Drug use: Not Currently    Types: Marijuana    Comment: not since 07/03/18    Allergies: No Known Allergies  Medications Prior to Admission  Medication Sig Dispense Refill Last Dose  . Prenatal Vit-Fe Fumarate-FA (PRENATAL VITAMIN PO) Take 1 tablet by mouth daily.    08/08/2018 at Unknown time  . Prenat w/o A-FeCbGl-DSS-FA-DHA (CITRANATAL ASSURE) 35-1 & 300 MG tablet One tablet and one capsule daily (Patient taking differently: Take 2 tablets by mouth See admin instructions. Take 1  tablet and 1 capsule by mouth daily) 60 tablet 11 not yet  . ranitidine (ZANTAC) 150 MG tablet Take 1 tablet (150 mg total) by mouth 2 (two) times daily. (Patient not taking: Reported on 08/01/2018) 60 tablet 0 Not Taking at Unknown time   Results for orders placed or performed during the hospital encounter of 08/08/18 (from the past 48 hour(s))  Urinalysis, Routine w reflex microscopic     Status: None   Collection Time: 08/08/18  2:37 PM  Result Value Ref Range   Color, Urine YELLOW YELLOW   APPearance CLEAR CLEAR   Specific Gravity, Urine 1.020 1.005 - 1.030   pH 6.0 5.0 - 8.0   Glucose, UA NEGATIVE NEGATIVE mg/dL   Hgb urine dipstick NEGATIVE NEGATIVE   Bilirubin Urine NEGATIVE NEGATIVE   Ketones, ur NEGATIVE NEGATIVE mg/dL   Protein, ur NEGATIVE NEGATIVE mg/dL   Nitrite NEGATIVE NEGATIVE   Leukocytes, UA NEGATIVE NEGATIVE    Comment: Microscopic not done on urines with negative protein, blood, leukocytes, nitrite, or glucose < 500 mg/dL.  Performed at [facility], [address] [place], [place] [address]   CBC with Differential     Status: Abnormal   Collection Time: 08/08/18  5:52 PM  Result Value Ref Range   WBC 11.6 (H) 4.0 - 10.5 K/uL   RBC 3.82 (L) 3.87 - 5.11 MIL/uL   Hemoglobin 11.9 (L) 12.0 - 15.0 g/dL   HCT 34.2 (L) 36.0 - 46.0 %   MCV 89.5 78.0 - 100.0 fL   MCH 31.2 26.0 - 34.0 pg   MCHC 34.8 30.0 - 36.0 g/dL   RDW 12.6 11.5 - 15.5 %   Platelets 217 150 - 400 K/uL   Neutrophils Relative % 77 %   Neutro Abs 8.9 (H) 1.7 - 7.7 K/uL   Lymphocytes Relative 15 %   Lymphs Abs 1.8 0.7 - 4.0 K/uL   Monocytes Relative 3 %   Monocytes Absolute 0.3 0.1 - 1.0 K/uL   Eosinophils Relative 5 %   Eosinophils Absolute 0.6 0.0 - 0.7 K/uL   Basophils Relative 0 %   Basophils Absolute 0.0 0.0 - 0.1 K/uL    Comment: Performed at [facility], [address], [place], [place] [address]  Comprehensive metabolic panel     Status: Abnormal   Collection Time: 08/08/18  5:52  PM  Result Value Ref Range   Sodium 132 (L) 135 - 145 mmol/L   Potassium 3.8 3.5 - 5.1 mmol/L   Chloride 102 98 - 111 mmol/L   CO2 22 22 - 32 mmol/L   Glucose, Bld 86 70 - 99 mg/dL   BUN 7 6 - 20 mg/dL   Creatinine, Ser 0.57 0.44 - 1.00 mg/dL   Calcium 9.6 8.9 - 10.3 mg/dL   Total Protein 6.8 6.5 - 8.1 g/dL   Albumin 4.0 3.5 - 5.0 g/dL   AST 16 15 - 41 U/L   ALT 15 0 - 44 U/L   Alkaline Phosphatase 47 38 - 126 U/L   Total Bilirubin 0.1 (L) 0.3 - 1.2 mg/dL   GFR calc non Af Amer >60 >60 mL/min   GFR calc Af Amer >60 >60 mL/min    Comment: (NOTE) The eGFR has been calculated using the CKD EPI equation. This calculation has not been validated in all clinical situations. eGFR's persistently <60 mL/min signify possible Chronic Kidney Disease.    Anion gap 8 5 - 15    Comment: Performed at [facility], [address], [place], [place] [address]  Lipase, blood     Status: None   Collection Time: 08/08/18  5:52 PM  Result Value Ref Range   Lipase 22 11 - 51 U/L    Comment: Performed at [facility], [address], [place], [place] [address]   Review of Systems  Constitutional: Negative for fever.  Gastrointestinal: Positive for abdominal pain. Negative for constipation, diarrhea, nausea and vomiting.  Genitourinary: Negative for dysuria.   Physical Exam   Blood pressure 114/77, pulse 69, temperature 98.2 F (36.8 C), temperature source Oral, resp. rate 18, weight 57.4 kg, last menstrual period 04/28/2018, SpO2 100 %, unknown if currently breastfeeding.  Physical Exam  Constitutional: She is oriented to person, place, and time. She appears well-developed and well-nourished. No distress.  HENT:  Head: Normocephalic.  Eyes: Pupils are equal, round, and reactive to light.  Respiratory: Effort normal.  GI: Soft. There is tenderness in the epigastric area. There is no rigidity, no rebound, no guarding and negative Murphy's sign.  Musculoskeletal: Normal range of motion.  Neurological: She is alert and oriented to person, place, and time.  Skin: Skin is warm. She is not diaphoretic.  Psychiatric: Her behavior is normal.    MAU Course  Procedures  None  MDM  + fetal heart tones  Gi cocktail given CBC, CMP, Lipase  Pain 0/10 at the time of discharge, down from 6/10.   Assessment and Plan   A:  1. Gastroesophageal reflux disease, esophagitis presence not specified   2. Upper abdominal pain     P:  Discharge home in stable condition  Take Zantac that was Rx'd on 8/12 Avoid fatty, greasy foods Would consider RUQ [name] if patient persists. Return to MAU if symptoms worsen  Rasch, Artist Pais, NP 08/08/2018 7:33 PM

## 2018-08-08 NOTE — Discharge Instructions (Signed)
Food Choices for Gastroesophageal Reflux Disease, Adult When you have gastroesophageal reflux disease (GERD), the foods you eat and your eating habits are very important. Choosing the right foods can help ease your discomfort. What guidelines do I need to follow?  Choose fruits, vegetables, whole grains, and low-fat dairy products.  Choose low-fat meat, fish, and poultry.  Limit fats such as oils, salad dressings, butter, nuts, and avocado.  Keep a food diary. This helps you identify foods that cause symptoms.  Avoid foods that cause symptoms. These may be different for everyone.  Eat small meals often instead of 3 large meals a day.  Eat your meals slowly, in a place where you are relaxed.  Limit fried foods.  Cook foods using methods other than frying.  Avoid drinking alcohol.  Avoid drinking large amounts of liquids with your meals.  Avoid bending over or lying down until 2-3 hours after eating. What foods are not recommended? These are some foods and drinks that may make your symptoms worse: Vegetables  Tomatoes. Tomato juice. Tomato and spaghetti sauce. Chili peppers. Onion and garlic. Horseradish. Fruits  Oranges, grapefruit, and lemon (fruit and juice). Meats  High-fat meats, fish, and poultry. This includes hot dogs, ribs, ham, sausage, salami, and bacon. Dairy  Whole milk and chocolate milk. Sour cream. Cream. Butter. Ice cream. Cream cheese. Drinks  Coffee and tea. Bubbly (carbonated) drinks or energy drinks. Condiments  Hot sauce. Barbecue sauce. Sweets/Desserts  Chocolate and cocoa. Donuts. Peppermint and spearmint. Fats and Oils  High-fat foods. This includes French fries and potato chips. Other  Vinegar. Strong spices. This includes black pepper, white pepper, red pepper, cayenne, curry powder, cloves, ginger, and chili powder. The items listed above may not be a complete list of foods and drinks to avoid. Contact your dietitian for more information.    This information is not intended to replace advice given to you by your health care provider. Make sure you discuss any questions you have with your health care provider. Document Released: 05/15/2012 Document Revised: 04/21/2016 Document Reviewed: 09/18/2013 Elsevier Interactive Patient Education  2017 Elsevier Inc.  

## 2018-08-29 ENCOUNTER — Ambulatory Visit (INDEPENDENT_AMBULATORY_CARE_PROVIDER_SITE_OTHER): Payer: Medicaid Other | Admitting: Advanced Practice Midwife

## 2018-08-29 ENCOUNTER — Other Ambulatory Visit: Payer: Medicaid Other

## 2018-08-29 ENCOUNTER — Ambulatory Visit (INDEPENDENT_AMBULATORY_CARE_PROVIDER_SITE_OTHER): Payer: Medicaid Other

## 2018-08-29 ENCOUNTER — Other Ambulatory Visit (HOSPITAL_COMMUNITY)
Admission: RE | Admit: 2018-08-29 | Discharge: 2018-08-29 | Disposition: A | Discharge status: Home / Self Care | Payer: Medicaid Other | Source: Ambulatory Visit | Attending: Obstetrics & Gynecology | Admitting: Obstetrics & Gynecology

## 2018-08-29 ENCOUNTER — Encounter: Payer: Self-pay | Admitting: Advanced Practice Midwife

## 2018-08-29 VITALS — BP 117/77 | HR 71 | Wt 128.0 lb

## 2018-08-29 DIAGNOSIS — Z3401 Encounter for supervision of normal first pregnancy, first trimester: Secondary | ICD-10-CM | POA: Diagnosis not present

## 2018-08-29 DIAGNOSIS — Z3682 Encounter for antenatal screening for nuchal translucency: Secondary | ICD-10-CM

## 2018-08-29 DIAGNOSIS — Z23 Encounter for immunization: Secondary | ICD-10-CM

## 2018-08-29 DIAGNOSIS — Z331 Pregnant state, incidental: Secondary | ICD-10-CM

## 2018-08-29 DIAGNOSIS — Z3A13 13 weeks gestation of pregnancy: Secondary | ICD-10-CM

## 2018-08-29 DIAGNOSIS — Z1379 Encounter for other screening for genetic and chromosomal anomalies: Secondary | ICD-10-CM

## 2018-08-29 DIAGNOSIS — Z1389 Encounter for screening for other disorder: Secondary | ICD-10-CM

## 2018-08-29 LAB — POCT URINALYSIS DIPSTICK OB
Blood, UA: NEGATIVE
Glucose, UA: NEGATIVE
KETONES UA: NEGATIVE
Leukocytes, UA: NEGATIVE
NITRITE UA: NEGATIVE
PROTEIN: NEGATIVE

## 2018-08-29 NOTE — Patient Instructions (Signed)
Molly Burton, I greatly value your feedback.  If you receive a survey following your visit with [name] today, we appreciate you taking the time to fill it out.  Thanks, Cathie Beams, CNM     Second Trimester of Pregnancy The second trimester is from week 14 through week 27 (months 4 through 6). The second trimester is often a time when you feel your best. Your body has adjusted to being pregnant, and you begin to feel better physically. Usually, morning sickness has lessened or quit completely, you may have more energy, and you may have an increase in appetite. The second trimester is also a time when the fetus is growing rapidly. At the end of the sixth month, the fetus is about 9 inches long and weighs about 1 pounds. You will likely begin to feel the baby move (quickening) between 16 and 20 weeks of pregnancy. Body changes during your second trimester Your body continues to go through many changes during your second trimester. The changes vary from woman to woman.  Your weight will continue to increase. You will notice your lower abdomen bulging out.  You may begin to get stretch marks on your hips, abdomen, and breasts.  You may develop headaches that can be relieved by medicines. The medicines should be approved by your health care provider.  You may urinate more often because the fetus is pressing on your bladder.  You may develop or continue to have heartburn as a result of your pregnancy.  You may develop constipation because certain hormones are causing the muscles that push waste through your intestines to slow down.  You may develop hemorrhoids or swollen, bulging veins (varicose veins).  You may have back pain. This is caused by: ? Weight gain. ? Pregnancy hormones that are relaxing the joints in your pelvis. ? A shift in weight and the muscles that support your balance.  Your breasts will continue to grow and they will continue to become tender.  Your gums may  bleed and may be sensitive to brushing and flossing.  Dark spots or blotches (chloasma, mask of pregnancy) may develop on your face. This will likely fade after the baby is born.  A dark line from your belly button to the pubic area (linea nigra) may appear. This will likely fade after the baby is born.  You may have changes in your hair. These can include thickening of your hair, rapid growth, and changes in texture. Some women also have hair loss during or after pregnancy, or hair that feels dry or thin. Your hair will most likely return to normal after your baby is born.  What to expect at prenatal visits During a routine prenatal visit:  You will be weighed to make sure you and the fetus are growing normally.  Your blood pressure will be taken.  Your abdomen will be measured to track your baby's growth.  The fetal heartbeat will be listened to.  Any test results from the previous visit will be discussed.  Your health care provider may ask you:  How you are feeling.  If you are feeling the baby move.  If you have had any abnormal symptoms, such as leaking fluid, bleeding, severe headaches, or abdominal cramping.  If you are using any tobacco products, including cigarettes, chewing tobacco, and electronic cigarettes.  If you have any questions.  Other tests that may be performed during your second trimester include:  Blood tests that check for: ? Low iron levels (anemia). ?  High blood sugar that affects pregnant women (gestational diabetes) between 20 and 28 weeks. ? Rh antibodies. This is to check for a protein on red blood cells (Rh factor).  Urine tests to check for infections, diabetes, or protein in the urine.  An ultrasound to confirm the proper growth and development of the baby.  An amniocentesis to check for possible genetic problems.  Fetal screens for spina bifida and Down syndrome.  HIV (human immunodeficiency virus) testing. Routine prenatal testing  includes screening for HIV, unless you choose not to have this test.  Follow these instructions at home: Medicines  Follow your health care provider's instructions regarding medicine use. Specific medicines may be either safe or unsafe to take during pregnancy.  Take a prenatal vitamin that contains at least 600 micrograms (mcg) of folic acid.  If you develop constipation, try taking a stool softener if your health care provider approves. Eating and drinking  Eat a balanced diet that includes fresh fruits and vegetables, whole grains, good sources of protein such as meat, eggs, or tofu, and low-fat dairy. Your health care provider will help you determine the amount of weight gain that is right for you.  Avoid raw meat and uncooked cheese. These carry germs that can cause birth defects in the baby.  If you have low calcium intake from food, talk to your health care provider about whether you should take a daily calcium supplement.  Limit foods that are high in fat and processed sugars, such as fried and sweet foods.  To prevent constipation: ? Drink enough fluid to keep your urine clear or pale yellow. ? Eat foods that are high in fiber, such as fresh fruits and vegetables, whole grains, and beans. Activity  Exercise only as directed by your health care provider. Most women can continue their usual exercise routine during pregnancy. Try to exercise for 30 minutes at least 5 days a week. Stop exercising if you experience uterine contractions.  Avoid heavy lifting, wear low heel shoes, and practice good posture.  A sexual relationship may be continued unless your health care provider directs you otherwise. Relieving pain and discomfort  Wear a good support bra to prevent discomfort from breast tenderness.  Take warm sitz baths to soothe any pain or discomfort caused by hemorrhoids. Use hemorrhoid cream if your health care provider approves.  Rest with your legs elevated if you have  leg cramps or low back pain.  If you develop varicose veins, wear support hose. Elevate your feet for 15 minutes, 3-4 times a day. Limit salt in your diet. Prenatal Care  Write down your questions. Take them to your prenatal visits.  Keep all your prenatal visits as told by your health care provider. This is important. Safety  Wear your seat belt at all times when driving.  Make a list of emergency phone numbers, including numbers for family, friends, the hospital, and police and fire departments. General instructions  Ask your health care provider for a referral to a local prenatal education class. Begin classes no later than the beginning of month 6 of your pregnancy.  Ask for help if you have counseling or nutritional needs during pregnancy. Your health care provider can offer advice or refer you to specialists for help with various needs.  Do not use hot tubs, steam rooms, or saunas.  Do not douche or use tampons or scented sanitary pads.  Do not cross your legs for long periods of time.  Avoid cat litter boxes and  soil used by cats. These carry germs that can cause birth defects in the baby and possibly loss of the fetus by miscarriage or stillbirth.  Avoid all smoking, herbs, alcohol, and unprescribed drugs. Chemicals in these products can affect the formation and growth of the baby.  Do not use any products that contain nicotine or tobacco, such as cigarettes and e-cigarettes. If you need help quitting, ask your health care provider.  Visit your dentist if you have not gone yet during your pregnancy. Use a soft toothbrush to brush your teeth and be gentle when you floss. Contact a health care provider if:  You have dizziness.  You have mild pelvic cramps, pelvic pressure, or nagging pain in the abdominal area.  You have persistent nausea, vomiting, or diarrhea.  You have a bad smelling vaginal discharge.  You have pain when you urinate. Get help right away if:  You  have a fever.  You are leaking fluid from your vagina.  You have spotting or bleeding from your vagina.  You have severe abdominal cramping or pain.  You have rapid weight gain or weight loss.  You have shortness of breath with chest pain.  You notice sudden or extreme swelling of your face, hands, ankles, feet, or legs.  You have not felt your baby move in over an hour.  You have severe headaches that do not go away when you take medicine.  You have vision changes. Summary  The second trimester is from week 14 through week 27 (months 4 through 6). It is also a time when the fetus is growing rapidly.  Your body goes through many changes during pregnancy. The changes vary from woman to woman.  Avoid all smoking, herbs, alcohol, and unprescribed drugs. These chemicals affect the formation and growth your baby.  Do not use any tobacco products, such as cigarettes, chewing tobacco, and e-cigarettes. If you need help quitting, ask your health care provider.  Contact your health care provider if you have any questions. Keep all prenatal visits as told by your health care provider. This is important. This information is not intended to replace advice given to you by your health care provider. Make sure you discuss any questions you have with your health care provider.      CHILDBIRTH CLASSES [phone] is the phone number for Pregnancy Classes or hospital tours at [place] will be referred to  [email or web address] for more information on childbirth classes  At this site you may register for classes. You may sign up for a waiting list if classes are full. Please SIGN UP FOR THIS!.   When the waiting list becomes long, sometimes new classes can be added.

## 2018-08-29 NOTE — Progress Notes (Addendum)
  G1P0 [redacted]w[redacted]d Estimated Date of Delivery: 03/04/19  Blood pressure 117/77, pulse 71, weight 128 lb (58.1 kg), last menstrual period 04/28/2018, unknown if currently breastfeeding.   BP weight and urine results all reviewed and noted.  Please refer to the obstetrical flow sheet for the fundal height and fetal heart rate documentation:  Patient denies any bleeding and no rupture of membranes symptoms or regular contractions. Patient is without complaints. Upon discussion w/pt regarding genetic screening, pt unsure if she would continue pregnancy if genetics are abnormal.  WIll do CfDNA instead of NT/IT so results can be obtained faster.  All questions were answered.   Physical Assessment:   Vitals:   08/29/18 1559  BP: 117/77  Pulse: 71  Weight: 128 lb (58.1 kg)  Body mass index is 22.67 kg/m.        Physical Examination:   General appearance: Well appearing, and in no distress  Mental status: Alert, oriented to person, place, and time  Skin: Warm & dry  Cardiovascular: Normal heart rate noted  Respiratory: Normal respiratory effort, no distress  Abdomen: Soft, gravid, nontender  Pelvic: Thin prep collected; vagina normal, small amount of asympo         Extremities: Edema: None  Fetal Status: Fetal Heart Rate (bpm): 158us       [name] 13+2 wks,measurements c/w dates,crl 75.68 mm,fhr 158 bpm,fundal placenta gr 0,normal ovaries bilat,NB present ,NT 1.4 mm  Results for orders placed or performed in visit on 08/29/18 (from the past 24 hour(s))  POC Urinalysis Dipstick OB   Collection Time: 08/29/18  4:00 PM  Result Value Ref Range   Color, UA     Clarity, UA     Glucose, UA Negative Negative   Bilirubin, UA     Ketones, UA neg    Spec Grav, UA     Blood, UA neg    pH, UA     POC Protein UA Negative Negative, Trace   Urobilinogen, UA     Nitrite, UA neg    Leukocytes, UA Negative Negative   Appearance     Odor       Orders Placed This Encounter  Procedures  . Flu Vaccine  QUAD 36+ mos IM (Fluarix, Quad PF)  . MaterniT21 PLUS Core  . POC Urinalysis Dipstick OB    Plan:  Continued routine obstetrical care, flu shot today  Return in about 26 days (around 09/24/2018) for LROB.

## 2018-08-29 NOTE — Progress Notes (Signed)
[name] 13+2 wks,measurements c/w dates,crl 75.68 mm,fhr 158 bpm,fundal placenta gr 0,normal ovaries bilat,NB present ,NT 1.4 mm

## 2018-08-31 LAB — CYTOLOGY - PAP: DIAGNOSIS: NEGATIVE

## 2018-09-03 LAB — MATERNIT 21 PLUS CORE, BLOOD
CHROMOSOME 13: NEGATIVE
CHROMOSOME 21: NEGATIVE
Chromosome 18: NEGATIVE
Y Chromosome: DETECTED

## 2018-09-21 ENCOUNTER — Encounter (HOSPITAL_COMMUNITY): Payer: Self-pay | Admitting: *Deleted

## 2018-09-21 ENCOUNTER — Inpatient Hospital Stay (HOSPITAL_COMMUNITY)
Admission: AD | Admit: 2018-09-21 | Discharge: 2018-09-21 | Disposition: A | Discharge status: Home / Self Care | Payer: Medicaid Other | Source: Ambulatory Visit | Attending: Obstetrics and Gynecology | Admitting: Obstetrics and Gynecology

## 2018-09-21 ENCOUNTER — Other Ambulatory Visit: Payer: Self-pay

## 2018-09-21 DIAGNOSIS — R12 Heartburn: Secondary | ICD-10-CM | POA: Diagnosis not present

## 2018-09-21 DIAGNOSIS — Z3A16 16 weeks gestation of pregnancy: Secondary | ICD-10-CM | POA: Insufficient documentation

## 2018-09-21 DIAGNOSIS — O26892 Other specified pregnancy related conditions, second trimester: Secondary | ICD-10-CM | POA: Diagnosis not present

## 2018-09-21 DIAGNOSIS — O99612 Diseases of the digestive system complicating pregnancy, second trimester: Secondary | ICD-10-CM | POA: Insufficient documentation

## 2018-09-21 DIAGNOSIS — R0789 Other chest pain: Secondary | ICD-10-CM | POA: Diagnosis not present

## 2018-09-21 DIAGNOSIS — R079 Chest pain, unspecified: Secondary | ICD-10-CM | POA: Diagnosis present

## 2018-09-21 LAB — COMPREHENSIVE METABOLIC PANEL
ALT: 22 U/L (ref 0–44)
ANION GAP: 8 (ref 5–15)
AST: 21 U/L (ref 15–41)
Albumin: 3.7 g/dL (ref 3.5–5.0)
Alkaline Phosphatase: 47 U/L (ref 38–126)
BUN: 7 mg/dL (ref 6–20)
CHLORIDE: 106 mmol/L (ref 98–111)
CO2: 22 mmol/L (ref 22–32)
Calcium: 9.3 mg/dL (ref 8.9–10.3)
Creatinine, Ser: 0.58 mg/dL (ref 0.44–1.00)
Glucose, Bld: 103 mg/dL — ABNORMAL HIGH (ref 70–99)
POTASSIUM: 3.5 mmol/L (ref 3.5–5.1)
Sodium: 136 mmol/L (ref 135–145)
Total Bilirubin: 0.6 mg/dL (ref 0.3–1.2)
Total Protein: 6.8 g/dL (ref 6.5–8.1)

## 2018-09-21 LAB — URINALYSIS, ROUTINE W REFLEX MICROSCOPIC
BILIRUBIN URINE: NEGATIVE
Glucose, UA: NEGATIVE mg/dL
HGB URINE DIPSTICK: NEGATIVE
KETONES UR: NEGATIVE mg/dL
Leukocytes, UA: NEGATIVE
NITRITE: NEGATIVE
Protein, ur: NEGATIVE mg/dL
Specific Gravity, Urine: 1.011 (ref 1.005–1.030)
pH: 7 (ref 5.0–8.0)

## 2018-09-21 LAB — CBC
HCT: 33 % — ABNORMAL LOW (ref 36.0–46.0)
Hemoglobin: 11.4 g/dL — ABNORMAL LOW (ref 12.0–15.0)
MCH: 31.5 pg (ref 26.0–34.0)
MCHC: 34.5 g/dL (ref 30.0–36.0)
MCV: 91.2 fL (ref 80.0–100.0)
NRBC: 0 % (ref 0.0–0.2)
PLATELETS: 187 10*3/uL (ref 150–400)
RBC: 3.62 MIL/uL — ABNORMAL LOW (ref 3.87–5.11)
RDW: 13.7 % (ref 11.5–15.5)
WBC: 10.2 10*3/uL (ref 4.0–10.5)

## 2018-09-21 LAB — TROPONIN I

## 2018-09-21 MED ORDER — FAMOTIDINE 20 MG PO TABS: 20.0000 mg | ORAL_TABLET | Freq: Two times a day (BID) | ORAL | 1 refills | Status: DC

## 2018-09-21 MED ORDER — ACETAMINOPHEN 500 MG PO TABS
1000.0000 mg | ORAL_TABLET | Freq: Once | ORAL | Status: AC
  Administered 2018-09-21: 1000 mg via ORAL
  Filled 2018-09-21: qty 2

## 2018-09-21 MED ORDER — ALUM & MAG HYDROXIDE-SIMETH 200-200-20 MG/5ML PO SUSP
30.0000 mL | Freq: Once | ORAL | Status: AC
  Administered 2018-09-21: 30 mL via ORAL
  Filled 2018-09-21: qty 30

## 2018-09-21 NOTE — MAU Provider Note (Signed)
Chief Complaint: Chest Pain   First Provider Initiated Contact with Patient 09/21/18 1347     SUBJECTIVE HPI: Molly Burton is a 22 y.o. G1P0 at [redacted]w[redacted]d who presents to [facility] reporting chest pain.  Symptoms began this morning while she was at work. Describes left sided & low substernal chest pressure. Pain is constant. Nothing makes better or worse. This is a reccurent issue. Last episode occurred a year ago. Before that she reports feeling frequent chest pain. Says she was evaluated at one point by a cardiologist & was told everything was normal. Recently seen for heartburn but has not started treatment for it.  Pain occurred this morning after eating [place] stew.  Denies cough, SOB, palpitations. No n/v/d, abdominal pain, vaginal bleeding.   Location: chest Quality: pressure Severity: 5/10 on pain scale Duration: 1 day Timing: constant Modifying factors: none Associated signs and symptoms: none  Past Medical History:  Diagnosis Date  . Chronic kidney disease    UTI   OB History  Gravida Para Term Preterm AB Living  1            SAB TAB Ectopic Multiple Live Births               # Outcome Date GA Lbr Len/2nd Weight Sex Delivery Anes PTL Lv  1 Current            Past Surgical History:  Procedure Laterality Date  . TONSILLECTOMY     Social History   Socioeconomic History  . Marital status: Single    Spouse name: Not on file  . Number of children: 0  . Years of education: N/A  . Highest education level: Bachelor's degree (e.g., BA, AB, BS)  Occupational History  . Not on file  Social Needs  . Financial resource strain: Not hard at all  . Food insecurity:    Worry: Never true    Inability: Never true  . Transportation needs:    Medical: No    Non-medical: No  Tobacco Use  . Smoking status: Never Smoker  . Smokeless tobacco: Never Used  Substance and Sexual Activity  . Alcohol use: Not Currently    Comment: Pt did have wine on 08/05/2018  .  Drug use: Not Currently    Types: Marijuana    Comment: not since 07/03/18  . Sexual activity: Not Currently    Birth control/protection: None  Lifestyle  . Physical activity:    Days per week: 0 days    Minutes per session: 0 min  . Stress: Only a little  Relationships  . Social connections:    Talks on phone: More than three times a week    Gets together: More than three times a week    Attends religious service: More than 4 times per year    Active member of club or organization: Yes    Attends meetings of clubs or organizations: 1 to 4 times per year    Relationship status: Never married  . Intimate partner violence:    Fear of current or ex partner: No    Emotionally abused: No    Physically abused: No    Forced sexual activity: No  Other Topics Concern  . Not on file  Social History Narrative  . Not on file   Family History  Problem Relation Age of Onset  . Thyroid disease Maternal Grandmother   . Thyroid disease Paternal Grandmother    No current facility-administered medications on file prior to  encounter.    Current Outpatient Medications on File Prior to Encounter  Medication Sig Dispense Refill  . Prenat w/o [id number] (CITRANATAL ASSURE) 35-1 & 300 MG tablet One tablet and one capsule daily (Patient taking differently: Take 2 tablets by mouth See admin instructions. Take 1 tablet and 1 capsule by mouth daily) 60 tablet 11   No Known Allergies  I have reviewed patient's Past Medical Hx, Surgical Hx, Family Hx, Social Hx, medications and allergies.   Review of Systems  Constitutional: Negative.   Respiratory: Negative for cough, shortness of breath and wheezing.   Cardiovascular: Positive for chest pain. Negative for palpitations.  Gastrointestinal: Negative.   Genitourinary: Negative.   Neurological: Negative.     OBJECTIVE Patient Vitals for the past 24 hrs:  BP Temp Temp src Pulse Resp SpO2 Height Weight  09/21/18 1504 128/65 98.2 F (36.8  C) Oral 93 18 100 % - -  09/21/18 1303 114/65 98.1 F (36.7 C) Oral 77 20 99 % - -  09/21/18 1255 - - - - - - 5\' 2"  (1.575 m) 61.8 kg   Constitutional: Well-developed, well-nourished female in no acute distress.  Cardiovascular: normal rate & rhythm, no murmur. Pain reproducible of left breast.  Respiratory: normal rate and effort. Lung sounds clear throughout GI: Abd soft, non-tender, Pos BS x 4. No guarding or rebound tenderness MS: Extremities nontender, no edema, normal ROM Neurologic: Alert and oriented x 4.      LAB RESULTS Results for orders placed or performed during the hospital encounter of 09/21/18 (from the past 24 hour(s))  Urinalysis, Routine w reflex microscopic     Status: Abnormal   Collection Time: 09/21/18 12:57 PM  Result Value Ref Range   Color, Urine YELLOW YELLOW   APPearance HAZY (A) CLEAR   Specific Gravity, Urine 1.011 1.005 - 1.030   pH 7.0 5.0 - 8.0   Glucose, UA NEGATIVE NEGATIVE mg/dL   Hgb urine dipstick NEGATIVE NEGATIVE   Bilirubin Urine NEGATIVE NEGATIVE   Ketones, ur NEGATIVE NEGATIVE mg/dL   Protein, ur NEGATIVE NEGATIVE mg/dL   Nitrite NEGATIVE NEGATIVE   Leukocytes, UA NEGATIVE NEGATIVE  CBC     Status: Abnormal   Collection Time: 09/21/18  1:24 PM  Result Value Ref Range   WBC 10.2 4.0 - 10.5 K/uL   RBC 3.62 (L) 3.87 - 5.11 MIL/uL   Hemoglobin 11.4 (L) 12.0 - 15.0 g/dL   HCT 57.8 (L) 46.9 - 62.9 %   MCV 91.2 80.0 - 100.0 fL   MCH 31.5 26.0 - 34.0 pg   MCHC 34.5 30.0 - 36.0 g/dL   RDW 52.8 41.3 - 24.4 %   Platelets 187 150 - 400 K/uL   nRBC 0.0 0.0 - 0.2 %  Comprehensive metabolic panel     Status: Abnormal   Collection Time: 09/21/18  1:24 PM  Result Value Ref Range   Sodium 136 135 - 145 mmol/L   Potassium 3.5 3.5 - 5.1 mmol/L   Chloride 106 98 - 111 mmol/L   CO2 22 22 - 32 mmol/L   Glucose, Bld 103 (H) 70 - 99 mg/dL   BUN 7 6 - 20 mg/dL   Creatinine, Ser 0.10 0.44 - 1.00 mg/dL   Calcium 9.3 8.9 - 27.2 mg/dL   Total Protein  6.8 6.5 - 8.1 g/dL   Albumin 3.7 3.5 - 5.0 g/dL   AST 21 15 - 41 U/L   ALT 22 0 - 44 U/L   Alkaline Phosphatase  47 38 - 126 U/L   Total Bilirubin 0.6 0.3 - 1.2 mg/dL   GFR calc non Af Amer >60 >60 mL/min   GFR calc Af Amer >60 >60 mL/min   Anion gap 8 5 - 15  Troponin I     Status: None   Collection Time: 09/21/18  1:24 PM  Result Value Ref Range   Troponin I <0.03 <0.03 ng/mL    IMAGING No results found.  MAU COURSE Orders Placed This Encounter  Procedures  . Urinalysis, Routine w reflex microscopic  . CBC  . Comprehensive metabolic panel  . Troponin I  . ED EKG  . Discharge patient   Meds ordered this encounter  Medications  . acetaminophen (TYLENOL) tablet 1,000 mg  . alum & mag hydroxide-simeth (MAALOX/MYLANTA) 200-200-20 MG/5ML suspension 30 mL  . famotidine (PEPCID) 20 MG tablet    Sig: Take 1 tablet (20 mg total) by mouth 2 (two) times daily.    Dispense:  60 tablet    Refill:  1    Order Specific Question:   Supervising Provider    Answer:   ERVIN, MICHAEL L [[id number]]    MDM VSS, NAD.  EKG normal Labs stable including normal troponin Pt given maalox & tylenol -- reports resolution of symptoms Will start on daily pepcid. If chest pain recurs, may consider cardiology referral.   ASSESSMENT 1. Chest pain, non-cardiac   2. [redacted] weeks gestation of pregnancy   3. Heartburn during pregnancy in second trimester     PLAN Discharge home in stable condition.   Allergies as of 09/21/2018   No Known Allergies     Medication List    TAKE these medications   CITRANATAL ASSURE 35-1 & 300 MG tablet One tablet and one capsule daily What changed:    how much to take  how to take this  when to take this  additional instructions   famotidine 20 MG tablet Commonly known as:  PEPCID Take 1 tablet (20 mg total) by mouth 2 (two) times daily.        Judeth Horn, NP 09/21/2018  6:53 PM

## 2018-09-21 NOTE — MAU Note (Signed)
Pt presents wih c/o chest pain on the left side and "a lot of pressure" in the center of chest.  States pain began this morning @ 0800.  No meds taken. No other c/o noted.

## 2018-09-21 NOTE — Discharge Instructions (Signed)
Heartburn During Pregnancy °Heartburn is a type of pain or discomfort that can happen in the throat or chest. It is often described as a burning sensation. Heartburn is common during pregnancy because: °· A hormone (progesterone) that is released during pregnancy may relax the valve (lower esophageal sphincter, or LES) that separates the esophagus from the stomach. This allows stomach acid to move up into the esophagus, causing heartburn. °· The uterus gets larger and pushes up on the stomach, which pushes more acid into the esophagus. This is especially true in the later stages of pregnancy. ° °Heartburn usually goes away or gets better after giving birth. °What are the causes? °Heartburn is caused by stomach acid backing up into the esophagus (reflux). Reflux can be triggered by: °· Changing hormone levels. °· Large meals. °· Certain foods and beverages, such as coffee, chocolate, onions, and peppermint. °· Exercise. °· Increased stomach acid production. ° °What increases the risk? °You are more likely to experience heartburn during pregnancy if you: °· Had heartburn prior to becoming pregnant. °· Have been pregnant more than once before. °· Are overweight or obese. ° °The likelihood that you will get heartburn also increases as you get farther along in your pregnancy, especially during the last trimester. °What are the signs or symptoms? °Symptoms of this condition include: °· Burning pain in the chest or lower throat. °· Bitter taste in the mouth. °· Coughing. °· Problems swallowing. °· Vomiting. °· Hoarse voice. °· Asthma. ° °Symptoms may get worse when you lie down or bend over. Symptoms are often worse at night. °How is this diagnosed? °This condition is diagnosed based on: °· Your medical history. °· Your symptoms. °· Blood tests to check for a certain type of bacteria associated with heartburn. °· Whether taking heartburn medicine relieves your symptoms. °· Examination of the stomach and esophagus using a  tube with a light and camera on the end (endoscopy). ° °How is this treated? °Treatment varies depending on how severe your symptoms are. Your health care provider may recommend: °· Over-the-counter medicines (antacids or acid reducers) for mild heartburn. °· Prescription medicines to decrease stomach acid or to protect your stomach lining. °· Certain changes in your diet. °· Raising the head of your bed so it is higher than the foot of the bed. This helps prevent stomach acid from backing up into the esophagus when you are lying down. ° °Follow these instructions at home: °Eating and drinking °· Do not drink alcohol during your pregnancy. °· Identify foods and beverages that make your symptoms worse, and avoid them. °· Beverages that you may want to avoid include: °? Coffee and tea (with or without caffeine). °? Energy drinks and sports drinks. °? Carbonated drinks or sodas. °? Citrus fruit juices. °· Foods that you may want to avoid include: °? Chocolate and cocoa. °? Peppermint and mint flavorings. °? Garlic, onions, and horseradish. °? Spicy and acidic foods, including peppers, chili powder, curry powder, vinegar, hot sauces, and barbecue sauce. °? Citrus fruits, such as oranges, lemons, and limes. °? Tomato-based foods, such as red sauce, chili, and salsa. °? Fried and fatty foods, such as donuts, french fries, potato chips, and high-fat dressings. °? High-fat meats, such as hot dogs, cold cuts, sausage, ham, and bacon. °? High-fat dairy items, such as whole milk, butter, and cheese. °· Eat small, frequent meals instead of large meals. °· Avoid drinking large amounts of liquid with your meals. °· Avoid eating meals during the 2-3 hours before   bedtime. °· Avoid lying down right after you eat. °· Do not exercise right after you eat. °Medicines °· Take over-the-counter and prescription medicines only as told by your health care provider. °· Do not take aspirin, ibuprofen, or other NSAIDs unless your health care  provider tells you to do that. °· You may be instructed to avoid medicines that contain sodium bicarbonate. °General instructions °· If directed, raise the head of your bed about 6 inches (15 cm) by putting blocks under the legs. Sleeping with more pillows does not effectively relieve heartburn because it only changes the position of your head. °· Do not use any products that contain nicotine or tobacco, such as cigarettes and e-cigarettes. If you need help quitting, ask your health care provider. °· Wear loose-fitting clothing. °· Try to reduce your stress, such as with yoga or meditation. If you need help managing stress, ask your health care provider. °· Maintain a healthy weight. If you are overweight, work with your health care provider to safely lose weight. °· Keep all follow-up visits as told by your health care provider. This is important. °Contact a health care provider if: °· You develop new symptoms. °· Your symptoms do not improve with treatment. °· You have unexplained weight loss. °· You have difficulty swallowing. °· You make loud sounds when you breathe (wheeze). °· You have a cough that does not go away. °· You have frequent heartburn for more than 2 weeks. °· You have nausea or vomiting that does not get better with treatment. °· You have pain in your abdomen. °Get help right away if: °· You have severe chest pain that spreads to your arm, neck, or jaw. °· You feel sweaty, dizzy, or light-headed. °· You have shortness of breath. °· You have pain when swallowing. °· You vomit, and your vomit looks like blood or coffee grounds. °· Your stool is bloody or black. °This information is not intended to replace advice given to you by your health care provider. Make sure you discuss any questions you have with your health care provider. °Document Released: 11/11/2000 Document Revised: 08/01/2016 Document Reviewed: 08/01/2016 °Elsevier Interactive Patient Education © 2018 [employer] ° °

## 2018-09-24 ENCOUNTER — Encounter: Payer: Self-pay | Admitting: Women's Health

## 2018-09-24 ENCOUNTER — Ambulatory Visit (INDEPENDENT_AMBULATORY_CARE_PROVIDER_SITE_OTHER): Payer: Medicaid Other | Admitting: Women's Health

## 2018-09-24 VITALS — BP 125/75 | HR 73 | Wt 135.5 lb

## 2018-09-24 DIAGNOSIS — Z3A17 17 weeks gestation of pregnancy: Secondary | ICD-10-CM

## 2018-09-24 DIAGNOSIS — Z3402 Encounter for supervision of normal first pregnancy, second trimester: Secondary | ICD-10-CM

## 2018-09-24 DIAGNOSIS — Z363 Encounter for antenatal screening for malformations: Secondary | ICD-10-CM

## 2018-09-24 NOTE — Patient Instructions (Signed)
Molly Burton, I greatly value your feedback.  If you receive a survey following your visit with [name] today, we appreciate you taking the time to fill it out.  Thanks, [name], CNM, WHNP-BC   Second Trimester of Pregnancy The second trimester is from week 14 through week 27 (months 4 through 6). The second trimester is often a time when you feel your best. Your body has adjusted to being pregnant, and you begin to feel better physically. Usually, morning sickness has lessened or quit completely, you may have more energy, and you may have an increase in appetite. The second trimester is also a time when the fetus is growing rapidly. At the end of the sixth month, the fetus is about 9 inches long and weighs about 1 pounds. You will likely begin to feel the baby move (quickening) between 16 and 20 weeks of pregnancy. Body changes during your second trimester Your body continues to go through many changes during your second trimester. The changes vary from woman to woman.  Your weight will continue to increase. You will notice your lower abdomen bulging out.  You may begin to get stretch marks on your hips, abdomen, and breasts.  You may develop headaches that can be relieved by medicines. The medicines should be approved by your health care provider.  You may urinate more often because the fetus is pressing on your bladder.  You may develop or continue to have heartburn as a result of your pregnancy.  You may develop constipation because certain hormones are causing the muscles that push waste through your intestines to slow down.  You may develop hemorrhoids or swollen, bulging veins (varicose veins).  You may have back pain. This is caused by: ? Weight gain. ? Pregnancy hormones that are relaxing the joints in your pelvis. ? A shift in weight and the muscles that support your balance.  Your breasts will continue to grow and they will continue to become tender.  Your gums may bleed  and may be sensitive to brushing and flossing.  Dark spots or blotches (chloasma, mask of pregnancy) may develop on your face. This will likely fade after the baby is born.  A dark line from your belly button to the pubic area (linea nigra) may appear. This will likely fade after the baby is born.  You may have changes in your hair. These can include thickening of your hair, rapid growth, and changes in texture. Some women also have hair loss during or after pregnancy, or hair that feels dry or thin. Your hair will most likely return to normal after your baby is born.  What to expect at prenatal visits During a routine prenatal visit:  You will be weighed to make sure you and the fetus are growing normally.  Your blood pressure will be taken.  Your abdomen will be measured to track your baby's growth.  The fetal heartbeat will be listened to.  Any test results from the previous visit will be discussed.  Your health care provider may ask you:  How you are feeling.  If you are feeling the baby move.  If you have had any abnormal symptoms, such as leaking fluid, bleeding, severe headaches, or abdominal cramping.  If you are using any tobacco products, including cigarettes, chewing tobacco, and electronic cigarettes.  If you have any questions.  Other tests that may be performed during your second trimester include:  Blood tests that check for: ? Low iron levels (anemia). ? High  blood sugar that affects pregnant women (gestational diabetes) between 3 and 28 weeks. ? Rh antibodies. This is to check for a protein on red blood cells (Rh factor).  Urine tests to check for infections, diabetes, or protein in the urine.  An ultrasound to confirm the proper growth and development of the baby.  An amniocentesis to check for possible genetic problems.  Fetal screens for spina bifida and Down syndrome.  HIV (human immunodeficiency virus) testing. Routine prenatal testing includes  screening for HIV, unless you choose not to have this test.  Follow these instructions at home: Medicines  Follow your health care provider's instructions regarding medicine use. Specific medicines may be either safe or unsafe to take during pregnancy.  Take a prenatal vitamin that contains at least 600 micrograms (mcg) of folic acid.  If you develop constipation, try taking a stool softener if your health care provider approves. Eating and drinking  Eat a balanced diet that includes fresh fruits and vegetables, whole grains, good sources of protein such as meat, eggs, or tofu, and low-fat dairy. Your health care provider will help you determine the amount of weight gain that is right for you.  Avoid raw meat and uncooked cheese. These carry germs that can cause birth defects in the baby.  If you have low calcium intake from food, talk to your health care provider about whether you should take a daily calcium supplement.  Limit foods that are high in fat and processed sugars, such as fried and sweet foods.  To prevent constipation: ? Drink enough fluid to keep your urine clear or pale yellow. ? Eat foods that are high in fiber, such as fresh fruits and vegetables, whole grains, and beans. Activity  Exercise only as directed by your health care provider. Most women can continue their usual exercise routine during pregnancy. Try to exercise for 30 minutes at least 5 days a week. Stop exercising if you experience uterine contractions.  Avoid heavy lifting, wear low heel shoes, and practice good posture.  A sexual relationship may be continued unless your health care provider directs you otherwise. Relieving pain and discomfort  Wear a good support bra to prevent discomfort from breast tenderness.  Take warm sitz baths to soothe any pain or discomfort caused by hemorrhoids. Use hemorrhoid cream if your health care provider approves.  Rest with your legs elevated if you have leg cramps  or low back pain.  If you develop varicose veins, wear support hose. Elevate your feet for 15 minutes, 3-4 times a day. Limit salt in your diet. Prenatal Care  Write down your questions. Take them to your prenatal visits.  Keep all your prenatal visits as told by your health care provider. This is important. Safety  Wear your seat belt at all times when driving.  Make a list of emergency phone numbers, including numbers for family, friends, the hospital, and police and fire departments. General instructions  Ask your health care provider for a referral to a local prenatal education class. Begin classes no later than the beginning of month 6 of your pregnancy.  Ask for help if you have counseling or nutritional needs during pregnancy. Your health care provider can offer advice or refer you to specialists for help with various needs.  Do not use hot tubs, steam rooms, or saunas.  Do not douche or use tampons or scented sanitary pads.  Do not cross your legs for long periods of time.  Avoid cat litter boxes and soil  used by cats. These carry germs that can cause birth defects in the baby and possibly loss of the fetus by miscarriage or stillbirth.  Avoid all smoking, herbs, alcohol, and unprescribed drugs. Chemicals in these products can affect the formation and growth of the baby.  Do not use any products that contain nicotine or tobacco, such as cigarettes and e-cigarettes. If you need help quitting, ask your health care provider.  Visit your dentist if you have not gone yet during your pregnancy. Use a soft toothbrush to brush your teeth and be gentle when you floss. Contact a health care provider if:  You have dizziness.  You have mild pelvic cramps, pelvic pressure, or nagging pain in the abdominal area.  You have persistent nausea, vomiting, or diarrhea.  You have a bad smelling vaginal discharge.  You have pain when you urinate. Get help right away if:  You have a  fever.  You are leaking fluid from your vagina.  You have spotting or bleeding from your vagina.  You have severe abdominal cramping or pain.  You have rapid weight gain or weight loss.  You have shortness of breath with chest pain.  You notice sudden or extreme swelling of your face, hands, ankles, feet, or legs.  You have not felt your baby move in over an hour.  You have severe headaches that do not go away when you take medicine.  You have vision changes. Summary  The second trimester is from week 14 through week 27 (months 4 through 6). It is also a time when the fetus is growing rapidly.  Your body goes through many changes during pregnancy. The changes vary from woman to woman.  Avoid all smoking, herbs, alcohol, and unprescribed drugs. These chemicals affect the formation and growth your baby.  Do not use any tobacco products, such as cigarettes, chewing tobacco, and e-cigarettes. If you need help quitting, ask your health care provider.  Contact your health care provider if you have any questions. Keep all prenatal visits as told by your health care provider. This is important. This information is not intended to replace advice given to you by your health care provider. Make sure you discuss any questions you have with your health care provider. Document Released: 11/08/2001 Document Revised: 04/21/2016 Document Reviewed: 01/15/2013 Elsevier Interactive Patient Education  2017 [employer].

## 2018-09-24 NOTE — Progress Notes (Signed)
   LOW-RISK PREGNANCY VISIT Patient name: Molly Burton MRN [id number]  Date of birth: May 03, 1996 Chief Complaint:   Routine Prenatal Visit  History of Present Illness:   Molly Burton is a 22 y.o. G1P0 female at [redacted]w[redacted]d with an Estimated Date of Delivery: 03/04/19 being seen today for ongoing management of a low-risk pregnancy.  Today she reports no complaints. Contractions: Not present. Vag. Bleeding: None.  Movement: Absent. denies leaking of fluid. Review of Systems:   Pertinent items are noted in HPI Denies abnormal vaginal discharge w/ itching/odor/irritation, headaches, visual changes, shortness of breath, chest pain, abdominal pain, severe nausea/vomiting, or problems with urination or bowel movements unless otherwise stated above. Pertinent History Reviewed:  Reviewed past medical,surgical, social, obstetrical and family history.  Reviewed problem list, medications and allergies. Physical Assessment:   Vitals:   09/24/18 1504  BP: 125/75  Pulse: 73  Weight: 135 lb 8 oz (61.5 kg)  Body mass index is 24.78 kg/m.        Physical Examination:   General appearance: Well appearing, and in no distress  Mental status: Alert, oriented to person, place, and time  Skin: Warm & dry  Cardiovascular: Normal heart rate noted  Respiratory: Normal respiratory effort, no distress  Abdomen: Soft, gravid, nontender  Pelvic: Cervical exam deferred         Extremities: Edema: None  Fetal Status: Fetal Heart Rate (bpm): 148   Movement: Absent    No results found for this or any previous visit (from the past 24 hour(s)).  Assessment & Plan:  1) Low-risk pregnancy G1P0 at [redacted]w[redacted]d with an Estimated Date of Delivery: 03/04/19    Meds: No orders of the defined types were placed in this encounter.  Labs/procedures today: none  Plan:  Continue routine obstetrical care   Reviewed: Preterm labor symptoms and general obstetric precautions including but not limited to vaginal bleeding,  contractions, leaking of fluid and fetal movement were reviewed in detail with the patient.  All questions were answered  Follow-up: Return in about 2 weeks (around 10/08/2018) for LROB, YQ:MVHQION.  Orders Placed This Encounter  Procedures  . US OB Comp + [address]   Cheral Marker CNM, [facility] 09/24/2018 3:22 PM

## 2018-10-08 ENCOUNTER — Ambulatory Visit (INDEPENDENT_AMBULATORY_CARE_PROVIDER_SITE_OTHER): Payer: Medicaid Other

## 2018-10-08 ENCOUNTER — Ambulatory Visit (INDEPENDENT_AMBULATORY_CARE_PROVIDER_SITE_OTHER): Payer: Medicaid Other | Admitting: Family Medicine

## 2018-10-08 VITALS — BP 130/78 | HR 94 | Wt 140.4 lb

## 2018-10-08 DIAGNOSIS — Z331 Pregnant state, incidental: Secondary | ICD-10-CM

## 2018-10-08 DIAGNOSIS — Z3A19 19 weeks gestation of pregnancy: Secondary | ICD-10-CM

## 2018-10-08 DIAGNOSIS — Z1389 Encounter for screening for other disorder: Secondary | ICD-10-CM

## 2018-10-08 DIAGNOSIS — Z3402 Encounter for supervision of normal first pregnancy, second trimester: Secondary | ICD-10-CM

## 2018-10-08 DIAGNOSIS — Z363 Encounter for antenatal screening for malformations: Secondary | ICD-10-CM

## 2018-10-08 DIAGNOSIS — O0992 Supervision of high risk pregnancy, unspecified, second trimester: Secondary | ICD-10-CM

## 2018-10-08 DIAGNOSIS — E0590 Thyrotoxicosis, unspecified without thyrotoxic crisis or storm: Secondary | ICD-10-CM

## 2018-10-08 DIAGNOSIS — O99282 Endocrine, nutritional and metabolic diseases complicating pregnancy, second trimester: Secondary | ICD-10-CM

## 2018-10-08 LAB — POCT URINALYSIS DIPSTICK OB
Blood, UA: NEGATIVE
GLUCOSE, UA: NEGATIVE
Ketones, UA: NEGATIVE
LEUKOCYTES UA: NEGATIVE
Nitrite, UA: NEGATIVE
PROTEIN: NEGATIVE

## 2018-10-08 NOTE — Progress Notes (Signed)
[name] 19 wks,breech,posterior fundal placenta gr 0,cx 3.7 cm,svp of fluid 6.7 cm,bilat adnexa's wnl,ovaries not visualized,fhr 133 bpm,EFW 302 g 79%,anatomy complete,no obvious abnormalities

## 2018-10-09 NOTE — Progress Notes (Signed)
    PRENATAL VISIT NOTE  Subjective:  Debara K Dezarn is a 22 y.o. G1P0 at 559w1d being seen today for ongoing prenatal care.  She is currently monitored for the following issues for this high-risk pregnancy and has Hyperthyroidism; Arthralgia; and Supervision of normal first pregnancy on their problem list.  Patient reports no complaints.  Contractions: Not present. Vag. Bleeding: None.  Movement: Present. Denies leaking of fluid.   The following portions of the patient's history were reviewed and updated as appropriate: allergies, current medications, past family history, past medical history, past social history, past surgical history and problem list. Problem list updated.  Objective:   Vitals:   10/08/18 1603  BP: 130/78  Pulse: 94  Weight: 140 lb 6.4 oz (63.7 kg)    Fetal Status: Fetal Heart Rate (bpm): 145 Fundal Height: 20 cm Movement: Present     General:  Alert, oriented and cooperative. Patient is in no acute distress.  Skin: Skin is warm and dry. No rash noted.   Cardiovascular: Normal heart rate noted  Respiratory: Normal respiratory effort, no problems with respiration noted  Abdomen: Soft, gravid, appropriate for gestational age.  Pain/Pressure: Absent     Pelvic: Cervical exam deferred        Extremities: Normal range of motion.  Edema: None  Mental Status: Normal mood and affect. Normal behavior. Normal judgment and thought content.   Assessment and Plan:  Pregnancy: G1P0 at 7559w1d  1. Encounter for supervision of normal first pregnancy in second trimester Up to date Had anatomy scan today Having gender reveal later  2.. Hyperthyroidism Asymptomatic, recommend TFTs next visit- last were in Sept 2019   Preterm labor symptoms and general obstetric precautions including but not limited to vaginal bleeding, contractions, leaking of fluid and fetal movement were reviewed in detail with the patient. Please refer to After Visit Summary for other counseling  recommendations.  Return in about 4 weeks (around 11/05/2018) for Routine prenatal care.  Future Appointments  Date Time Provider Department Center  11/05/2018  3:00 PM Cheral MarkerBooker, Verle Brillhart R, CNM [facility]    Federico FlakeKimberly Niles Haydin Calandra, MD

## 2018-11-01 ENCOUNTER — Telehealth: Payer: Self-pay | Admitting: *Deleted

## 2018-11-01 NOTE — Telephone Encounter (Signed)
Patient informed paternity testing is not done during pregnancy at any location in this area. Informed [place] chor[place].  Informed post delivery would be easier.  Verbalized understanding.

## 2018-11-05 ENCOUNTER — Ambulatory Visit (INDEPENDENT_AMBULATORY_CARE_PROVIDER_SITE_OTHER): Payer: Medicaid Other | Admitting: Women's Health

## 2018-11-05 ENCOUNTER — Encounter: Payer: Self-pay | Admitting: Women's Health

## 2018-11-05 VITALS — BP 111/71 | HR 68 | Wt 143.3 lb

## 2018-11-05 DIAGNOSIS — Z331 Pregnant state, incidental: Secondary | ICD-10-CM

## 2018-11-05 DIAGNOSIS — Z1389 Encounter for screening for other disorder: Secondary | ICD-10-CM

## 2018-11-05 DIAGNOSIS — Z3A23 23 weeks gestation of pregnancy: Secondary | ICD-10-CM

## 2018-11-05 DIAGNOSIS — Z3402 Encounter for supervision of normal first pregnancy, second trimester: Secondary | ICD-10-CM

## 2018-11-05 LAB — POCT URINALYSIS DIPSTICK OB
Blood, UA: NEGATIVE
Glucose, UA: NEGATIVE
KETONES UA: NEGATIVE
Leukocytes, UA: NEGATIVE
NITRITE UA: NEGATIVE
PROTEIN: NEGATIVE

## 2018-11-05 NOTE — Progress Notes (Signed)
   LOW-RISK PREGNANCY VISIT Patient name: Molly Burton Folz MRN [id number]  Date of birth: 06-18-1996 Chief Complaint:   Routine Prenatal Visit  History of Present Illness:   Molly Burton Clapham is a 22 y.o. G1P0 female at 5924w0d with an Estimated Date of Delivery: 03/04/19 being seen today for ongoing management of a low-risk pregnancy.  Today she reports no complaints. Contractions: Not present.  .  Movement: Present. denies leaking of fluid. Review of Systems:   Pertinent items are noted in HPI Denies abnormal vaginal discharge w/ itching/odor/irritation, headaches, visual changes, shortness of breath, chest pain, abdominal pain, severe nausea/vomiting, or problems with urination or bowel movements unless otherwise stated above. Pertinent History Reviewed:  Reviewed past medical,surgical, social, obstetrical and family history.  Reviewed problem list, medications and allergies. Physical Assessment:   Vitals:   11/05/18 1452  BP: 111/71  Pulse: 68  Weight: 143 lb 4.8 oz (65 kg)  Body mass index is 26.21 kg/m.        Physical Examination:   General appearance: Well appearing, and in no distress  Mental status: Alert, oriented to person, place, and time  Skin: Warm & dry  Cardiovascular: Normal heart rate noted  Respiratory: Normal respiratory effort, no distress  Abdomen: Soft, gravid, nontender  Pelvic: Cervical exam deferred         Extremities: Edema: None  Fetal Status: Fetal Heart Rate (bpm): 150 Fundal Height: 23 cm Movement: Present    Results for orders placed or performed in visit on 11/05/18 (from the past 24 hour(s))  POC Urinalysis Dipstick OB   Collection Time: 11/05/18  3:00 PM  Result Value Ref Range   Color, UA     Clarity, UA     Glucose, UA Negative Negative   Bilirubin, UA     Ketones, UA neg    Spec Grav, UA     Blood, UA neg    pH, UA     POC,PROTEIN,UA Negative Negative, Trace, Small (1+), Moderate (2+), Large (3+), 4+   Urobilinogen, UA     Nitrite,  UA neg    Leukocytes, UA Negative Negative   Appearance     Odor      Assessment & Plan:  1) Low-risk pregnancy G1P0 at 6024w0d with an Estimated Date of Delivery: 03/04/19    Meds: No orders of the defined types were placed in this encounter.  Labs/procedures today: none  Plan:  Continue routine obstetrical care   Reviewed: Preterm labor symptoms and general obstetric precautions including but not limited to vaginal bleeding, contractions, leaking of fluid and fetal movement were reviewed in detail with the patient.  All questions were answered  Follow-up: Return in about 4 weeks (around 12/03/2018) for LROB, PN2.  Orders Placed This Encounter  Procedures  . POC Urinalysis Dipstick OB   Cheral MarkerKimberly R Shakirra Buehler CNM, [facility] 11/05/2018 3:39 PM

## 2018-11-05 NOTE — Patient Instructions (Addendum)
Molly Burton, I greatly value your feedback.  If you receive a survey following your visit with [name] today, we appreciate you taking the time to fill it out.  Thanks, [name], CNM, WHNP-BC   You will have your sugar test next visit.  Please do not eat or drink anything after midnight the night before you come, not even water.  You will be here for at least two hours.     Call the office ([phone]) or go to [facility] if:  You begin to have strong, frequent contractions  Your water breaks.  Sometimes it is a big gush of fluid, sometimes it is just a trickle that keeps getting your panties wet or running down your legs  You have vaginal bleeding.  It is normal to have a small amount of spotting if your cervix was checked.   You don't feel your baby moving like normal.  If you don't, get you something to eat and drink and lay down and focus on feeling your baby move.   If your baby is still not moving like normal, you should call the office or go to [facility].  Second Trimester of Pregnancy The second trimester is from week 13 through week 28, months 4 through 6. The second trimester is often a time when you feel your best. Your body has also adjusted to being pregnant, and you begin to feel better physically. Usually, morning sickness has lessened or quit completely, you may have more energy, and you may have an increase in appetite. The second trimester is also a time when the fetus is growing rapidly. At the end of the sixth month, the fetus is about 9 inches long and weighs about 1 pounds. You will likely begin to feel the baby move (quickening) between 18 and 20 weeks of the pregnancy. BODY CHANGES Your body goes through many changes during pregnancy. The changes vary from woman to woman.   Your weight will continue to increase. You will notice your lower abdomen bulging out.  You may begin to get stretch marks on your hips, abdomen, and breasts.  You may develop  headaches that can be relieved by medicines approved by your health care provider.  You may urinate more often because the fetus is pressing on your bladder.  You may develop or continue to have heartburn as a result of your pregnancy.  You may develop constipation because certain hormones are causing the muscles that push waste through your intestines to slow down.  You may develop hemorrhoids or swollen, bulging veins (varicose veins).  You may have back pain because of the weight gain and pregnancy hormones relaxing your joints between the bones in your pelvis and as a result of a shift in weight and the muscles that support your balance.  Your breasts will continue to grow and be tender.  Your gums may bleed and may be sensitive to brushing and flossing.  Dark spots or blotches (chloasma, mask of pregnancy) may develop on your face. This will likely fade after the baby is born.  A dark line from your belly button to the pubic area (linea nigra) may appear. This will likely fade after the baby is born.  You may have changes in your hair. These can include thickening of your hair, rapid growth, and changes in texture. Some women also have hair loss during or after pregnancy, or hair that feels dry or thin. Your hair will most likely return to normal after your  baby is born. WHAT TO EXPECT AT YOUR PRENATAL VISITS During a routine prenatal visit:  You will be weighed to make sure you and the fetus are growing normally.  Your blood pressure will be taken.  Your abdomen will be measured to track your baby's growth.  The fetal heartbeat will be listened to.  Any test results from the previous visit will be discussed. Your health care provider may ask you:  How you are feeling.  If you are feeling the baby move.  If you have had any abnormal symptoms, such as leaking fluid, bleeding, severe headaches, or abdominal cramping.  If you have any questions. Other tests that may be  performed during your second trimester include:  Blood tests that check for:  Low iron levels (anemia).  Gestational diabetes (between 24 and 28 weeks).  Rh antibodies.  Urine tests to check for infections, diabetes, or protein in the urine.  An ultrasound to confirm the proper growth and development of the baby.  An amniocentesis to check for possible genetic problems.  Fetal screens for spina bifida and Down syndrome. HOME CARE INSTRUCTIONS   Avoid all smoking, herbs, alcohol, and unprescribed drugs. These chemicals affect the formation and growth of the baby.  Follow your health care provider's instructions regarding medicine use. There are medicines that are either safe or unsafe to take during pregnancy.  Exercise only as directed by your health care provider. Experiencing uterine cramps is a good sign to stop exercising.  Continue to eat regular, healthy meals.  Wear a good support bra for breast tenderness.  Do not use hot tubs, steam rooms, or saunas.  Wear your seat belt at all times when driving.  Avoid raw meat, uncooked cheese, cat litter boxes, and soil used by cats. These carry germs that can cause birth defects in the baby.  Take your prenatal vitamins.  Try taking a stool softener (if your health care provider approves) if you develop constipation. Eat more high-fiber foods, such as fresh vegetables or fruit and whole grains. Drink plenty of fluids to keep your urine clear or pale yellow.  Take warm sitz baths to soothe any pain or discomfort caused by hemorrhoids. Use hemorrhoid cream if your health care provider approves.  If you develop varicose veins, wear support hose. Elevate your feet for 15 minutes, 3-4 times a day. Limit salt in your diet.  Avoid heavy lifting, wear low heel shoes, and practice good posture.  Rest with your legs elevated if you have leg cramps or low back pain.  Visit your dentist if you have not gone yet during your pregnancy.  Use a soft toothbrush to brush your teeth and be gentle when you floss.  A sexual relationship may be continued unless your health care provider directs you otherwise.  Continue to go to all your prenatal visits as directed by your health care provider. SEEK MEDICAL CARE IF:   You have dizziness.  You have mild pelvic cramps, pelvic pressure, or nagging pain in the abdominal area.  You have persistent nausea, vomiting, or diarrhea.  You have a bad smelling vaginal discharge.  You have pain with urination. SEEK IMMEDIATE MEDICAL CARE IF:   You have a fever.  You are leaking fluid from your vagina.  You have spotting or bleeding from your vagina.  You have severe abdominal cramping or pain.  You have rapid weight gain or loss.  You have shortness of breath with chest pain.  You notice sudden or extreme   swelling of your face, hands, ankles, feet, or legs.  You have not felt your baby move in over an hour.  You have severe headaches that do not go away with medicine.  You have vision changes. Document Released: 11/08/2001 Document Revised: 11/19/2013 Document Reviewed: 01/15/2013 [facility] Patient Information 2015 [employer] [place]. This information is not intended to replace advice given to you by your health care provider. Make sure you discuss any questions you have with your health care provider.     AREA PEDIATRIC/FAMILY PRACTICE PHYSICIANS  [facility] [address] [place], [place] [address] Phone - [phone]   Fax - [phone]  JACK AMOS [address] [place], [place]  [address] Phone - [phone]   Fax - [phone]  [facility] [address] [place], [place]  [address] Phone - [phone]   Fax - [phone]  [facility] [address] [place], [place]  [address] Phone - [phone]   Fax - [phone]  [facility] [address] [place], [place]   [address] Phone - [phone]   Fax - [phone]  [facility] [address], [address] [place], [place]  [address] Phone - [phone]   Fax - [phone]  [facility] [address], [address] [place], [place]  [address] Phone - [phone]   Fax - [phone]  [facility] [address] [place], [place]  [address] Phone - [phone]   Fax - [phone]  [facility] [address] [place], [place]  [address] Phone - [phone]   Fax - [phone] [facility] [address] [place], [place]  [address] Phone - [phone]   Fax - [phone]  [facility] [address] [place], [place]  [address] Phone - [phone]   Fax - [phone]  [facility] [address], [address] [place], [place]  [address] Phone - [phone]   Fax - [phone]  [facility] [address], [address] [place], [place]  [address] Phone - [phone]   Fax - [phone]  [facility] [address], [address] [place], [place]  [address] Phone - [phone]  [facility] [address] [place], [place]  [address] Phone - [phone]   Fax - [phone]  [facility] [address], [address] [place], [place]  [address] Phone - [phone]   Fax - [phone]  [facility] [address] [place], [place]  [address] Phone - [phone]   Fax - [phone]  [facility] [address] [place], [place]  [address] Phone - [phone]   Fax - [phone]   [facility] [address], [address] [place], [place]  [address] Phone - [phone]   Fax - [phone]  [facility] [address], [address] [place], [place]  [address] Phone - [phone]   Fax - [phone]  DAVID RUBIN [address], [address] [place], [place]  [address] Phone - [phone]   Fax - [phone]  [facility] [address], [address] [place], [place]  [address] Phone - [phone]   Fax - [phone]  [facility] [address] [place], [place]  [address] Phone - [phone]   Fax - [phone] [facility] [place] [address] [place], [place]  [address] Phone - [phone]  Fax - [phone]  [facility] [address] [place], [place]  [address] Phone - [phone]   Fax - [phone]  [facility] [place] [address], [address] [place], [place]  [address] Phone - [phone]   Fax - [phone]

## 2018-12-03 ENCOUNTER — Other Ambulatory Visit: Payer: Medicaid Other

## 2018-12-03 ENCOUNTER — Encounter: Payer: Self-pay | Admitting: Obstetrics & Gynecology

## 2018-12-03 ENCOUNTER — Ambulatory Visit (INDEPENDENT_AMBULATORY_CARE_PROVIDER_SITE_OTHER): Payer: Medicaid Other | Admitting: Obstetrics & Gynecology

## 2018-12-03 VITALS — BP 125/78 | HR 73 | Wt 153.0 lb

## 2018-12-03 DIAGNOSIS — Z3402 Encounter for supervision of normal first pregnancy, second trimester: Secondary | ICD-10-CM

## 2018-12-03 DIAGNOSIS — Z3A27 27 weeks gestation of pregnancy: Secondary | ICD-10-CM

## 2018-12-03 DIAGNOSIS — Z331 Pregnant state, incidental: Secondary | ICD-10-CM

## 2018-12-03 DIAGNOSIS — Z1389 Encounter for screening for other disorder: Secondary | ICD-10-CM

## 2018-12-03 LAB — POCT URINALYSIS DIPSTICK OB
Blood, UA: NEGATIVE
Glucose, UA: NEGATIVE
Ketones, UA: NEGATIVE
Leukocytes, UA: NEGATIVE
Nitrite, UA: NEGATIVE
POC,PROTEIN,UA: NEGATIVE

## 2018-12-03 NOTE — Progress Notes (Signed)
   LOW-RISK PREGNANCY VISIT Patient name: Molly Burton MRN [id number]  Date of birth: Jan 31, 1996 Chief Complaint:   Routine Prenatal Visit (PN2)  History of Present Illness:   Molly Burton is a 23 y.o. G1P0 female at [redacted]w[redacted]d with an Estimated Date of Delivery: 03/04/19 being seen today for ongoing management of a low-risk pregnancy.  Today she reports no complaints. Contractions: Not present. Vag. Bleeding: None.  Movement: Present. denies leaking of fluid. Review of Systems:   Pertinent items are noted in HPI Denies abnormal vaginal discharge w/ itching/odor/irritation, headaches, visual changes, shortness of breath, chest pain, abdominal pain, severe nausea/vomiting, or problems with urination or bowel movements unless otherwise stated above. Pertinent History Reviewed:  Reviewed past medical,surgical, social, obstetrical and family history.  Reviewed problem list, medications and allergies. Physical Assessment:   Vitals:   12/03/18 0908  BP: 125/78  Pulse: 73  Weight: 153 lb (69.4 kg)  Body mass index is 27.98 kg/m.        Physical Examination:   General appearance: Well appearing, and in no distress  Mental status: Alert, oriented to person, place, and time  Skin: Warm & dry  Cardiovascular: Normal heart rate noted  Respiratory: Normal respiratory effort, no distress  Abdomen: Soft, gravid, nontender  Pelvic: Cervical exam deferred         Extremities: Edema: None  Fetal Status: Fetal Heart Rate (bpm): 152 Fundal Height: 28 cm Movement: Present    Results for orders placed or performed in visit on 12/03/18 (from the past 24 hour(s))  POC Urinalysis Dipstick OB   Collection Time: 12/03/18  9:15 AM  Result Value Ref Range   Color, UA     Clarity, UA     Glucose, UA Negative Negative   Bilirubin, UA     Ketones, UA neg    Spec Grav, UA     Blood, UA neg    pH, UA     POC,PROTEIN,UA Negative Negative, Trace, Small (1+), Moderate (2+), Large (3+), 4+   Urobilinogen,  UA     Nitrite, UA neg    Leukocytes, UA Negative Negative   Appearance     Odor      Assessment & Plan:  1) Low-risk pregnancy G1P0 at [redacted]w[redacted]d with an Estimated Date of Delivery: 03/04/19      Meds: No orders of the defined types were placed in this encounter.  Labs/procedures today: PN2  Plan:  Continue routine obstetrical care   Reviewed: Term labor symptoms and general obstetric precautions including but not limited to vaginal bleeding, contractions, leaking of fluid and fetal movement were reviewed in detail with the patient.  All questions were answered  Follow-up: Return in about 3 weeks (around 12/24/2018) for LROB.  Orders Placed This Encounter  Procedures  . POC Urinalysis Dipstick OB   Lazaro Arms  12/03/2018 9:41 AM

## 2018-12-04 LAB — CBC
HEMATOCRIT: 36.1 % (ref 34.0–46.6)
Hemoglobin: 12.4 g/dL (ref 11.1–15.9)
MCH: 31.7 pg (ref 26.6–33.0)
MCHC: 34.3 g/dL (ref 31.5–35.7)
MCV: 92 fL (ref 79–97)
Platelets: 172 10*3/uL (ref 150–450)
RBC: 3.91 x10E6/uL (ref 3.77–5.28)
RDW: 12.4 % (ref 11.7–15.4)
WBC: 11.8 10*3/uL — ABNORMAL HIGH (ref 3.4–10.8)

## 2018-12-04 LAB — GLUCOSE TOLERANCE, 2 HOURS W/ 1HR
Glucose, 1 hour: 108 mg/dL (ref 65–179)
Glucose, 2 hour: 91 mg/dL (ref 65–152)
Glucose, Fasting: 76 mg/dL (ref 65–91)

## 2018-12-04 LAB — RPR: RPR Ser Ql: NONREACTIVE

## 2018-12-04 LAB — HIV ANTIBODY (ROUTINE TESTING W REFLEX): HIV Screen 4th Generation wRfx: NONREACTIVE

## 2018-12-04 LAB — ANTIBODY SCREEN: Antibody Screen: NEGATIVE

## 2018-12-24 ENCOUNTER — Encounter: Payer: Medicaid Other | Admitting: Obstetrics & Gynecology

## 2018-12-31 ENCOUNTER — Encounter: Payer: Self-pay | Admitting: Obstetrics & Gynecology

## 2018-12-31 ENCOUNTER — Ambulatory Visit (INDEPENDENT_AMBULATORY_CARE_PROVIDER_SITE_OTHER): Payer: Medicaid Other | Admitting: Obstetrics & Gynecology

## 2018-12-31 VITALS — BP 122/77 | HR 80 | Wt 164.5 lb

## 2018-12-31 DIAGNOSIS — Z23 Encounter for immunization: Secondary | ICD-10-CM

## 2018-12-31 DIAGNOSIS — Z1389 Encounter for screening for other disorder: Secondary | ICD-10-CM

## 2018-12-31 DIAGNOSIS — Z3403 Encounter for supervision of normal first pregnancy, third trimester: Secondary | ICD-10-CM

## 2018-12-31 DIAGNOSIS — Z3A31 31 weeks gestation of pregnancy: Secondary | ICD-10-CM

## 2018-12-31 DIAGNOSIS — Z331 Pregnant state, incidental: Secondary | ICD-10-CM

## 2018-12-31 LAB — POCT URINALYSIS DIPSTICK OB
Glucose, UA: NEGATIVE
Ketones, UA: NEGATIVE
LEUKOCYTES UA: NEGATIVE
NITRITE UA: NEGATIVE
RBC UA: NEGATIVE

## 2018-12-31 NOTE — Progress Notes (Signed)
Patient ID: Molly CaterDestiny K Burton, female   DOB: June 13, 1996, 23 y.o.   MRN: [id number]   LOW-RISK PREGNANCY VISIT Patient name: Molly CaterDestiny K Burton MRN [id number]  Date of birth: June 13, 1996 Chief Complaint:   Routine Prenatal Visit  History of Present Illness:   Molly Burton is a 23 y.o. G1P0 female at 4046w0d with an Estimated Date of Delivery: 03/04/19 being seen today for ongoing management of a low-risk pregnancy.  Today she reports no complaints. Contractions: Not present. Vag. Bleeding: None.  Movement: Present. denies leaking of fluid. Review of Systems:   Pertinent items are noted in HPI Denies abnormal vaginal discharge w/ itching/odor/irritation, headaches, visual changes, shortness of breath, chest pain, abdominal pain, severe nausea/vomiting, or problems with urination or bowel movements unless otherwise stated above. Pertinent History Reviewed:  Reviewed past medical,surgical, social, obstetrical and family history.  Reviewed problem list, medications and allergies. Physical Assessment:   Vitals:   12/31/18 1550  BP: 122/77  Pulse: 80  Weight: 164 lb 8 oz (74.6 kg)  Body mass index is 30.09 kg/m.        Physical Examination:   General appearance: Well appearing, and in no distress  Mental status: Alert, oriented to person, place, and time  Skin: Warm & dry  Cardiovascular: Normal heart rate noted  Respiratory: Normal respiratory effort, no distress  Abdomen: Soft, gravid, nontender  Pelvic: Cervical exam deferred         Extremities: Edema: None  Fetal Status: Fetal Heart Rate (bpm): 145 Fundal Height: 32 cm Movement: Present    Results for orders placed or performed in visit on 12/31/18 (from the past 24 hour(s))  POC Urinalysis Dipstick OB   Collection Time: 12/31/18  3:52 PM  Result Value Ref Range   Color, UA     Clarity, UA     Glucose, UA Negative Negative   Bilirubin, UA     Ketones, UA neg    Spec Grav, UA     Blood, UA neg    pH, UA     POC,PROTEIN,UA  Trace Negative, Trace, Small (1+), Moderate (2+), Large (3+), 4+   Urobilinogen, UA     Nitrite, UA neg    Leukocytes, UA Negative Negative   Appearance     Odor      Assessment & Plan:  1) Low-risk pregnancy G1P0 at 546w0d with an Estimated Date of Delivery: 03/04/19   2) discussed excessive weight gain at length, gave dietary advise   Meds: No orders of the defined types were placed in this encounter.  Labs/procedures today:   Plan:  Continue routine obstetrical care   Reviewed: Preterm labor symptoms and general obstetric precautions including but not limited to vaginal bleeding, contractions, leaking of fluid and fetal movement were reviewed in detail with the patient.  All questions were answered  Follow-up: Return in about 2 weeks (around 01/14/2019) for LROB.  Orders Placed This Encounter  Procedures  . Tdap vaccine greater than or equal to 7yo IM  . POC Urinalysis Dipstick OB   Lazaro ArmsLuther H Petra Dumler  12/31/2018 4:26 PM

## 2019-01-14 ENCOUNTER — Encounter: Payer: Medicaid Other | Admitting: Women's Health

## 2019-01-14 ENCOUNTER — Encounter: Payer: Self-pay | Admitting: Women's Health

## 2019-01-14 ENCOUNTER — Ambulatory Visit (INDEPENDENT_AMBULATORY_CARE_PROVIDER_SITE_OTHER): Payer: Medicaid Other | Admitting: Women's Health

## 2019-01-14 VITALS — BP 123/75 | HR 80 | Wt 170.0 lb

## 2019-01-14 DIAGNOSIS — Z3403 Encounter for supervision of normal first pregnancy, third trimester: Secondary | ICD-10-CM

## 2019-01-14 DIAGNOSIS — Z1389 Encounter for screening for other disorder: Secondary | ICD-10-CM

## 2019-01-14 DIAGNOSIS — Z3A33 33 weeks gestation of pregnancy: Secondary | ICD-10-CM

## 2019-01-14 DIAGNOSIS — Z331 Pregnant state, incidental: Secondary | ICD-10-CM

## 2019-01-14 LAB — POCT URINALYSIS DIPSTICK OB
GLUCOSE, UA: NEGATIVE
KETONES UA: NEGATIVE
NITRITE UA: NEGATIVE
POC,PROTEIN,UA: NEGATIVE

## 2019-01-14 NOTE — Progress Notes (Signed)
   LOW-RISK PREGNANCY VISIT Patient name: Molly Burton MRN [id number]  Date of birth: [birth year] Chief Complaint:   Routine Prenatal Visit  History of Present Illness:   Molly Burton is a 23 y.o. G1P0 female at [redacted]w[redacted]d with an Estimated Date of Delivery: 03/04/19 being seen today for ongoing management of a low-risk pregnancy.  Today she reports no complaints. Contractions: Not present. Vag. Bleeding: None.  Movement: Present. denies leaking of fluid. Review of Systems:   Pertinent items are noted in HPI Denies abnormal vaginal discharge w/ itching/odor/irritation, headaches, visual changes, shortness of breath, chest pain, abdominal pain, severe nausea/vomiting, or problems with urination or bowel movements unless otherwise stated above. Pertinent History Reviewed:  Reviewed past medical,surgical, social, obstetrical and family history.  Reviewed problem list, medications and allergies. Physical Assessment:   Vitals:   01/14/19 1113  BP: 123/75  Pulse: 80  Weight: 170 lb (77.1 kg)  Body mass index is 31.09 kg/m.        Physical Examination:   General appearance: Well appearing, and in no distress  Mental status: Alert, oriented to person, place, and time  Skin: Warm & dry  Cardiovascular: Normal heart rate noted  Respiratory: Normal respiratory effort, no distress  Abdomen: Soft, gravid, nontender  Pelvic: Cervical exam deferred         Extremities: Edema: None  Fetal Status: Fetal Heart Rate (bpm): 143 Fundal Height: 32 cm Movement: Present    Results for orders placed or performed in visit on 01/14/19 (from the past 24 hour(s))  POC Urinalysis Dipstick OB   Collection Time: 01/14/19 11:14 AM  Result Value Ref Range   Color, UA     Clarity, UA     Glucose, UA Negative Negative   Bilirubin, UA     Ketones, UA neg    Spec Grav, UA     Blood, UA trace    pH, UA     POC,PROTEIN,UA Negative Negative, Trace, Small (1+), Moderate (2+), Large (3+), 4+   Urobilinogen, UA      Nitrite, UA neg    Leukocytes, UA Trace (A) Negative   Appearance     Odor      Assessment & Plan:  1) Low-risk pregnancy G1P0 at [redacted]w[redacted]d with an Estimated Date of Delivery: 03/04/19    Meds: No orders of the defined types were placed in this encounter.  Labs/procedures today: none  Plan:  Continue routine obstetrical care   Reviewed: Preterm labor symptoms and general obstetric precautions including but not limited to vaginal bleeding, contractions, leaking of fluid and fetal movement were reviewed in detail with the patient.  All questions were answered  Follow-up: Return in about 2 weeks (around 01/28/2019) for LROB.  Orders Placed This Encounter  Procedures  . POC Urinalysis Dipstick OB   Cheral Marker CNM, [facility] 01/14/2019 11:47 AM

## 2019-01-14 NOTE — Patient Instructions (Addendum)
Molly Burton, I greatly value your feedback.  If you receive a survey following your visit with [name] today, we appreciate you taking the time to fill it out.  Thanks, [name], CNM, [facility]  [facility] IS MOVING!!! to [facility] ([address] [place], [place] [address]) on Sunday January 20, 2019 at 5:00am It will be called [facility], and it is located off of [address]. DO NOT GO TO [address] (the current [facility]) on February 23rd or after, no one will be there!  Tips to Help You Sleep Better:   Get into a bedtime routine, try to do the same thing every night before going to bed to try to help your body wind down  Warm baths  Avoid caffeine for at least 3 hours before going to sleep   Keep your room at a slightly cooler temperature, can try running a fan  Turn off TV, lights, phone, electronics  Lots of pillows if needed to help you get comfortable  Lavender scented items can help you sleep. You can place lavender essential oil on a cotton ball and place under your pillowcase, or place in a diffuser. [employer] has a lavender scented sleep line (plug-ins, sprays, etc). Look in the pillow aisle for lavender scented pillows.   If none of the above things help, you can try 1/2 to 1 tablet of benadryl, unisom, or tylenol pm. Do not take this every night, only when you really need it.      Call the office [phone]) or go to [facility] if:  You begin to have strong, frequent contractions  Your water breaks.  Sometimes it is a big gush of fluid, sometimes it is just a trickle that keeps getting your panties wet or running down your legs  You have vaginal bleeding.  It is normal to have a small amount of spotting if your cervix was checked.   You don't feel your baby moving like normal.  If you don't, get you something to eat and drink and lay down and focus on feeling your baby move.  You should feel at least 10  movements in 2 hours.  If you don't, you should call the office or go to [facility].    Tdap Vaccine  It is recommended that you get the Tdap vaccine during the third trimester of EACH pregnancy to help protect your baby from getting pertussis (whooping cough)  27-36 weeks is the BEST time to do this so that you can pass the protection on to your baby. During pregnancy is better than after pregnancy, but if you are unable to get it during pregnancy it will be offered at the hospital.   You can get this vaccine with [name], at the health department, your family doctor, or some local pharmacies  Everyone who will be around your baby should also be up-to-date on their vaccines before the baby comes. Adults (who are not pregnant) only need 1 dose of Tdap during adulthood.   Third Trimester of Pregnancy The third trimester is from week 29 through week 42, months 7 through 9. The third trimester is a time when the fetus is growing rapidly. At the end of the ninth month, the fetus is about 20 inches in length and weighs 6-10 pounds.  BODY CHANGES Your body goes through many changes during pregnancy. The changes vary from woman to woman.   Your weight will continue to increase. You  can expect to gain 25-35 pounds (11-16 kg) by the end of the pregnancy.  You may begin to get stretch marks on your hips, abdomen, and breasts.  You may urinate more often because the fetus is moving lower into your pelvis and pressing on your bladder.  You may develop or continue to have heartburn as a result of your pregnancy.  You may develop constipation because certain hormones are causing the muscles that push waste through your intestines to slow down.  You may develop hemorrhoids or swollen, bulging veins (varicose veins).  You may have pelvic pain because of the weight gain and pregnancy hormones relaxing your joints between the bones in your pelvis. Backaches may result from overexertion of the muscles  supporting your posture.  You may have changes in your hair. These can include thickening of your hair, rapid growth, and changes in texture. Some women also have hair loss during or after pregnancy, or hair that feels dry or thin. Your hair will most likely return to normal after your baby is born.  Your breasts will continue to grow and be tender. A yellow discharge may leak from your breasts called colostrum.  Your belly button may stick out.  You may feel short of breath because of your expanding uterus.  You may notice the fetus "dropping," or moving lower in your abdomen.  You may have a bloody mucus discharge. This usually occurs a few days to a week before labor begins.  Your cervix becomes thin and soft (effaced) near your due date. WHAT TO EXPECT AT YOUR PRENATAL EXAMS  You will have prenatal exams every 2 weeks until week 36. Then, you will have weekly prenatal exams. During a routine prenatal visit:  You will be weighed to make sure you and the fetus are growing normally.  Your blood pressure is taken.  Your abdomen will be measured to track your baby's growth.  The fetal heartbeat will be listened to.  Any test results from the previous visit will be discussed.  You may have a cervical check near your due date to see if you have effaced. At around 36 weeks, your caregiver will check your cervix. At the same time, your caregiver will also perform a test on the secretions of the vaginal tissue. This test is to determine if a type of bacteria, Group B streptococcus, is present. Your caregiver will explain this further. Your caregiver may ask you:  What your birth plan is.  How you are feeling.  If you are feeling the baby move.  If you have had any abnormal symptoms, such as leaking fluid, bleeding, severe headaches, or abdominal cramping.  If you have any questions. Other tests or screenings that may be performed during your third trimester include:  Blood tests  that check for low iron levels (anemia).  Fetal testing to check the health, activity level, and growth of the fetus. Testing is done if you have certain medical conditions or if there are problems during the pregnancy. FALSE LABOR You may feel small, irregular contractions that eventually go away. These are called Braxton Hicks contractions, or false labor. Contractions may last for hours, days, or even weeks before true labor sets in. If contractions come at regular intervals, intensify, or become painful, it is best to be seen by your caregiver.  SIGNS OF LABOR   Menstrual-like cramps.  Contractions that are 5 minutes apart or less.  Contractions that start on the top of the uterus and spread down  to the lower abdomen and back.  A sense of increased pelvic pressure or back pain.  A watery or bloody mucus discharge that comes from the vagina. If you have any of these signs before the 37th week of pregnancy, call your caregiver right away. You need to go to the hospital to get checked immediately. HOME CARE INSTRUCTIONS   Avoid all smoking, herbs, alcohol, and unprescribed drugs. These chemicals affect the formation and growth of the baby.  Follow your caregiver's instructions regarding medicine use. There are medicines that are either safe or unsafe to take during pregnancy.  Exercise only as directed by your caregiver. Experiencing uterine cramps is a good sign to stop exercising.  Continue to eat regular, healthy meals.  Wear a good support bra for breast tenderness.  Do not use hot tubs, steam rooms, or saunas.  Wear your seat belt at all times when driving.  Avoid raw meat, uncooked cheese, cat litter boxes, and soil used by cats. These carry germs that can cause birth defects in the baby.  Take your prenatal vitamins.  Try taking a stool softener (if your caregiver approves) if you develop constipation. Eat more high-fiber foods, such as fresh vegetables or fruit and whole  grains. Drink plenty of fluids to keep your urine clear or pale yellow.  Take warm sitz baths to soothe any pain or discomfort caused by hemorrhoids. Use hemorrhoid cream if your caregiver approves.  If you develop varicose veins, wear support hose. Elevate your feet for 15 minutes, 3-4 times a day. Limit salt in your diet.  Avoid heavy lifting, wear low heal shoes, and practice good posture.  Rest a lot with your legs elevated if you have leg cramps or low back pain.  Visit your dentist if you have not gone during your pregnancy. Use a soft toothbrush to brush your teeth and be gentle when you floss.  A sexual relationship may be continued unless your caregiver directs you otherwise.  Do not travel far distances unless it is absolutely necessary and only with the approval of your caregiver.  Take prenatal classes to understand, practice, and ask questions about the labor and delivery.  Make a trial run to the hospital.  Pack your hospital bag.  Prepare the baby's nursery.  Continue to go to all your prenatal visits as directed by your caregiver. SEEK MEDICAL CARE IF:  You are unsure if you are in labor or if your water has broken.  You have dizziness.  You have mild pelvic cramps, pelvic pressure, or nagging pain in your abdominal area.  You have persistent nausea, vomiting, or diarrhea.  You have a bad smelling vaginal discharge.  You have pain with urination. SEEK IMMEDIATE MEDICAL CARE IF:   You have a fever.  You are leaking fluid from your vagina.  You have spotting or bleeding from your vagina.  You have severe abdominal cramping or pain.  You have rapid weight loss or gain.  You have shortness of breath with chest pain.  You notice sudden or extreme swelling of your face, hands, ankles, feet, or legs.  You have not felt your baby move in over an hour.  You have severe headaches that do not go away with medicine.  You have vision changes. Document  Released: 11/08/2001 Document Revised: 11/19/2013 Document Reviewed: 01/15/2013 [facility] Patient Information 2015 [place], [place]. This information is not intended to replace advice given to you by your health care provider. Make sure you discuss any questions you  have with your health care provider.

## 2019-01-28 ENCOUNTER — Ambulatory Visit (INDEPENDENT_AMBULATORY_CARE_PROVIDER_SITE_OTHER): Payer: Medicaid Other | Admitting: Obstetrics & Gynecology

## 2019-01-28 ENCOUNTER — Encounter: Payer: Self-pay | Admitting: Obstetrics & Gynecology

## 2019-01-28 VITALS — BP 125/75 | HR 67 | Wt 173.5 lb

## 2019-01-28 DIAGNOSIS — Z1389 Encounter for screening for other disorder: Secondary | ICD-10-CM

## 2019-01-28 DIAGNOSIS — Z331 Pregnant state, incidental: Secondary | ICD-10-CM

## 2019-01-28 DIAGNOSIS — Z3403 Encounter for supervision of normal first pregnancy, third trimester: Secondary | ICD-10-CM

## 2019-01-28 DIAGNOSIS — Z3A35 35 weeks gestation of pregnancy: Secondary | ICD-10-CM

## 2019-01-28 LAB — POCT URINALYSIS DIPSTICK OB
GLUCOSE, UA: NEGATIVE
Ketones, UA: NEGATIVE
Leukocytes, UA: NEGATIVE
Nitrite, UA: NEGATIVE
POC,PROTEIN,UA: NEGATIVE
RBC UA: NEGATIVE

## 2019-01-28 NOTE — Progress Notes (Signed)
   LOW-RISK PREGNANCY VISIT Patient name: Molly Burton MRN [id number]  Date of birth: Aug 14, 1996 Chief Complaint:   Routine Prenatal Visit  History of Present Illness:   Molly Burton is a 23 y.o. G1P0 female at [redacted]w[redacted]d with an Estimated Date of Delivery: 03/04/19 being seen today for ongoing management of a low-risk pregnancy.  Today she reports no complaints. Contractions: Not present. Vag. Bleeding: None.  Movement: Present. denies leaking of fluid. Review of Systems:   Pertinent items are noted in HPI Denies abnormal vaginal discharge w/ itching/odor/irritation, headaches, visual changes, shortness of breath, chest pain, abdominal pain, severe nausea/vomiting, or problems with urination or bowel movements unless otherwise stated above. Pertinent History Reviewed:  Reviewed past medical,surgical, social, obstetrical and family history.  Reviewed problem list, medications and allergies. Physical Assessment:   Vitals:   01/28/19 1500  BP: 125/75  Pulse: 67  Weight: 173 lb 8 oz (78.7 kg)  Body mass index is 31.73 kg/m.        Physical Examination:   General appearance: Well appearing, and in no distress  Mental status: Alert, oriented to person, place, and time  Skin: Warm & dry  Cardiovascular: Normal heart rate noted  Respiratory: Normal respiratory effort, no distress  Abdomen: Soft, gravid, nontender  Pelvic: Cervical exam deferred         Extremities: Edema: Trace  Fetal Status: Fetal Heart Rate (bpm): 142 Fundal Height: 35 cm Movement: Present    Results for orders placed or performed in visit on 01/28/19 (from the past 24 hour(s))  POC Urinalysis Dipstick OB   Collection Time: 01/28/19  3:01 PM  Result Value Ref Range   Color, UA     Clarity, UA     Glucose, UA Negative Negative   Bilirubin, UA     Ketones, UA neg    Spec Grav, UA     Blood, UA neg    pH, UA     POC,PROTEIN,UA Negative Negative, Trace, Small (1+), Moderate (2+), Large (3+), 4+   Urobilinogen,  UA     Nitrite, UA neg    Leukocytes, UA Negative Negative   Appearance     Odor      Assessment & Plan:  1) Low-risk pregnancy G1P0 at [redacted]w[redacted]d with an Estimated Date of Delivery: 03/04/19     Meds: No orders of the defined types were placed in this encounter.  Labs/procedures today:   Plan:  Continue routine obstetrical care GBS next visit  Reviewed: Preterm labor symptoms and general obstetric precautions including but not limited to vaginal bleeding, contractions, leaking of fluid and fetal movement were reviewed in detail with the patient.  All questions were answered  Follow-up: Return in about 11 days (around 02/08/2019) for LROB.  Orders Placed This Encounter  Procedures  . POC Urinalysis Dipstick OB   Lazaro Arms 01/28/2019 3:22 PM

## 2019-02-08 ENCOUNTER — Ambulatory Visit (INDEPENDENT_AMBULATORY_CARE_PROVIDER_SITE_OTHER): Payer: Medicaid Other | Admitting: Obstetrics & Gynecology

## 2019-02-08 ENCOUNTER — Encounter: Payer: Self-pay | Admitting: Obstetrics & Gynecology

## 2019-02-08 ENCOUNTER — Other Ambulatory Visit: Payer: Self-pay

## 2019-02-08 VITALS — BP 122/75 | HR 77 | Wt 177.0 lb

## 2019-02-08 DIAGNOSIS — Z3403 Encounter for supervision of normal first pregnancy, third trimester: Secondary | ICD-10-CM

## 2019-02-08 DIAGNOSIS — Z331 Pregnant state, incidental: Secondary | ICD-10-CM

## 2019-02-08 DIAGNOSIS — Z1389 Encounter for screening for other disorder: Secondary | ICD-10-CM

## 2019-02-08 DIAGNOSIS — Z3A36 36 weeks gestation of pregnancy: Secondary | ICD-10-CM | POA: Diagnosis not present

## 2019-02-08 LAB — POCT URINALYSIS DIPSTICK OB
Blood, UA: NEGATIVE
GLUCOSE, UA: NEGATIVE
KETONES UA: NEGATIVE
Leukocytes, UA: NEGATIVE
Nitrite, UA: NEGATIVE
POC,PROTEIN,UA: NEGATIVE

## 2019-02-08 LAB — OB RESULTS CONSOLE GBS: GBS: NEGATIVE

## 2019-02-08 LAB — OB RESULTS CONSOLE GC/CHLAMYDIA: Gonorrhea: NEGATIVE

## 2019-02-08 NOTE — Progress Notes (Signed)
   LOW-RISK PREGNANCY VISIT Patient name: MARZELL NAILS MRN [id number]  Date of birth: 1996/10/26 Chief Complaint:   Routine Prenatal Visit (gbs/gc)  History of Present Illness:   Molly Burton is a 23 y.o. G1P0 female at [redacted]w[redacted]d with an Estimated Date of Delivery: 03/04/19 being seen today for ongoing management of a low-risk pregnancy.  Today she reports no complaints. Contractions: Not present. Vag. Bleeding: None.  Movement: Present. denies leaking of fluid. Review of Systems:   Pertinent items are noted in HPI Denies abnormal vaginal discharge w/ itching/odor/irritation, headaches, visual changes, shortness of breath, chest pain, abdominal pain, severe nausea/vomiting, or problems with urination or bowel movements unless otherwise stated above. Pertinent History Reviewed:  Reviewed past medical,surgical, social, obstetrical and family history.  Reviewed problem list, medications and allergies. Physical Assessment:   Vitals:   02/08/19 1256  BP: 122/75  Pulse: 77  Weight: 177 lb (80.3 kg)  Body mass index is 32.37 kg/m.        Physical Examination:   General appearance: Well appearing, and in no distress  Mental status: Alert, oriented to person, place, and time  Skin: Warm & dry  Cardiovascular: Normal heart rate noted  Respiratory: Normal respiratory effort, no distress  Abdomen: Soft, gravid, nontender  Pelvic: Cervical exam performed  Dilation: Closed Effacement (%): Thick Station: -2  Extremities: Edema: Trace  Fetal Status: Fetal Heart Rate (bpm): 145 Fundal Height: 35 cm Movement: Present Presentation: Vertex  Results for orders placed or performed in visit on 02/08/19 (from the past 24 hour(s))  POC Urinalysis Dipstick OB   Collection Time: 02/08/19 12:55 PM  Result Value Ref Range   Color, UA     Clarity, UA     Glucose, UA Negative Negative   Bilirubin, UA     Ketones, UA neg    Spec Grav, UA     Blood, UA neg    pH, UA     POC,PROTEIN,UA Negative  Negative, Trace, Small (1+), Moderate (2+), Large (3+), 4+   Urobilinogen, UA     Nitrite, UA neg    Leukocytes, UA Negative Negative   Appearance     Odor      Assessment & Plan:  1) Low-risk pregnancy G1P0 at [redacted]w[redacted]d with an Estimated Date of Delivery: 03/04/19     Meds: No orders of the defined types were placed in this encounter.  Labs/procedures today: cultures done  Plan:  Continue routine obstetrical care   Reviewed: Term labor symptoms and general obstetric precautions including but not limited to vaginal bleeding, contractions, leaking of fluid and fetal movement were reviewed in detail with the patient.  All questions were answered  Follow-up: Return in about 1 week (around 02/15/2019) for LROB.  Orders Placed This Encounter  Procedures  . GC/Chlamydia Probe Amp  . Culture, beta strep (group b only)  . POC Urinalysis Dipstick OB   Amaryllis Dyke   02/08/2019 1:09 PM

## 2019-02-12 LAB — GC/CHLAMYDIA PROBE AMP
CHLAMYDIA, DNA PROBE: NEGATIVE
Neisseria gonorrhoeae by PCR: NEGATIVE

## 2019-02-12 LAB — CULTURE, BETA STREP (GROUP B ONLY): Strep Gp B Culture: NEGATIVE

## 2019-02-13 ENCOUNTER — Other Ambulatory Visit: Payer: Self-pay

## 2019-02-13 ENCOUNTER — Encounter: Payer: Self-pay | Admitting: Women's Health

## 2019-02-13 ENCOUNTER — Ambulatory Visit (INDEPENDENT_AMBULATORY_CARE_PROVIDER_SITE_OTHER): Payer: Medicaid Other | Admitting: Women's Health

## 2019-02-13 VITALS — BP 120/74 | HR 70 | Wt 178.0 lb

## 2019-02-13 DIAGNOSIS — Z331 Pregnant state, incidental: Secondary | ICD-10-CM

## 2019-02-13 DIAGNOSIS — Z3403 Encounter for supervision of normal first pregnancy, third trimester: Secondary | ICD-10-CM

## 2019-02-13 DIAGNOSIS — Z1389 Encounter for screening for other disorder: Secondary | ICD-10-CM

## 2019-02-13 DIAGNOSIS — Z3A37 37 weeks gestation of pregnancy: Secondary | ICD-10-CM

## 2019-02-13 LAB — POCT URINALYSIS DIPSTICK OB
Blood, UA: NEGATIVE
Glucose, UA: NEGATIVE
Ketones, UA: NEGATIVE
LEUKOCYTES UA: NEGATIVE
Nitrite, UA: NEGATIVE
POC,PROTEIN,UA: NEGATIVE

## 2019-02-13 NOTE — Progress Notes (Signed)
   LOW-RISK PREGNANCY VISIT Patient name: Molly Burton MRN [id number]  Date of birth: 1996-06-29 Chief Complaint:   Routine Prenatal Visit  History of Present Illness:   Molly Burton is a 23 y.o. G1P0 female at [redacted]w[redacted]d with an Estimated Date of Delivery: 03/04/19 being seen today for ongoing management of a low-risk pregnancy.  Today she reports no complaints. Contractions: Not present. Vag. Bleeding: None.  Movement: Present. denies leaking of fluid. Review of Systems:   Pertinent items are noted in HPI Denies abnormal vaginal discharge w/ itching/odor/irritation, headaches, visual changes, shortness of breath, chest pain, abdominal pain, severe nausea/vomiting, or problems with urination or bowel movements unless otherwise stated above. Pertinent History Reviewed:  Reviewed past medical,surgical, social, obstetrical and family history.  Reviewed problem list, medications and allergies. Physical Assessment:   Vitals:   02/13/19 1219  BP: 120/74  Pulse: 70  Weight: 178 lb (80.7 kg)  Body mass index is 32.56 kg/m.        Physical Examination:   General appearance: Well appearing, and in no distress  Mental status: Alert, oriented to person, place, and time  Skin: Warm & dry  Cardiovascular: Normal heart rate noted  Respiratory: Normal respiratory effort, no distress  Abdomen: Soft, gravid, nontender  Pelvic: Cervical exam deferred         Extremities: Edema: Trace  Fetal Status: Fetal Heart Rate (bpm): 137 Fundal Height: 36 cm Movement: Present Presentation: Vertex by Leopold's  Results for orders placed or performed in visit on 02/13/19 (from the past 24 hour(s))  POC Urinalysis Dipstick OB   Collection Time: 02/13/19 12:20 PM  Result Value Ref Range   Color, UA     Clarity, UA     Glucose, UA Negative Negative   Bilirubin, UA     Ketones, UA neg    Spec Grav, UA     Blood, UA neg    pH, UA     POC,PROTEIN,UA Negative Negative, Trace, Small (1+), Moderate (2+),  Large (3+), 4+   Urobilinogen, UA     Nitrite, UA neg    Leukocytes, UA Negative Negative   Appearance     Odor      Assessment & Plan:  1) Low-risk pregnancy G1P0 at [redacted]w[redacted]d with an Estimated Date of Delivery: 03/04/19    Meds: No orders of the defined types were placed in this encounter.  Labs/procedures today: none  Plan:  Continue routine obstetrical care   Reviewed: Term labor symptoms and general obstetric precautions including but not limited to vaginal bleeding, contractions, leaking of fluid and fetal movement were reviewed in detail with the patient.  All questions were answered  Follow-up: Return in about 1 week (around 02/20/2019) for LROB.  Orders Placed This Encounter  Procedures  . POC Urinalysis Dipstick OB   Cheral Marker CNM, [facility] 02/13/2019 12:41 PM

## 2019-02-13 NOTE — Patient Instructions (Signed)
Molly Burton, I greatly value your feedback.  If you receive a survey following your visit with [name] today, we appreciate you taking the time to fill it out.  Thanks, [name], CNM, [facility]  [facility] HOSPITAL HAS MOVED!!! It is now [facility] at [facility] ([address] [place], [place] [address]) Entrance located off of [address] Free 24/7 valet parking     Call the office [phone]) or go to [facility] if:  You begin to have strong, frequent contractions  Your water breaks.  Sometimes it is a big gush of fluid, sometimes it is just a trickle that keeps getting your panties wet or running down your legs  You have vaginal bleeding.  It is normal to have a small amount of spotting if your cervix was checked.   You don't feel your baby moving like normal.  If you don't, get you something to eat and drink and lay down and focus on feeling your baby move.  You should feel at least 10 movements in 2 hours.  If you don't, you should call the office or go to [facility].    [facility] Contractions Contractions of the uterus can occur throughout pregnancy, but they are not always a sign that you are in labor. You may have practice contractions called Braxton Hicks contractions. These false labor contractions are sometimes confused with true labor. What are Deberah Pelton contractions? Braxton Hicks contractions are tightening movements that occur in the muscles of the uterus before labor. Unlike true labor contractions, these contractions do not result in opening (dilation) and thinning of the cervix. Toward the end of pregnancy (32-34 weeks), Braxton Hicks contractions can happen more often and may become stronger. These contractions are sometimes difficult to tell apart from true labor because they can be very uncomfortable. You should not feel embarrassed if you go to the hospital with false labor. Sometimes, the only way to tell if you are in true labor is  for your health care provider to look for changes in the cervix. The health care provider will do a physical exam and may monitor your contractions. If you are not in true labor, the exam should show that your cervix is not dilating and your water has not broken. If there are no other health problems associated with your pregnancy, it is completely safe for you to be sent home with false labor. You may continue to have Braxton Hicks contractions until you go into true labor. How to tell the difference between true labor and false labor True labor  Contractions last 30-70 seconds.  Contractions become very regular.  Discomfort is usually felt in the top of the uterus, and it spreads to the lower abdomen and low back.  Contractions do not go away with walking.  Contractions usually become more intense and increase in frequency.  The cervix dilates and gets thinner. False labor  Contractions are usually shorter and not as strong as true labor contractions.  Contractions are usually irregular.  Contractions are often felt in the front of the lower abdomen and in the groin.  Contractions may go away when you walk around or change positions while lying down.  Contractions get weaker and are shorter-lasting as time goes on.  The cervix usually does not dilate or become thin. Follow these instructions at home:   Take over-the-counter and prescription medicines only as told by your health care provider.  Keep up with your usual exercises and  follow other instructions from your health care provider.  Eat and drink lightly if you think you are going into labor.  If Braxton Hicks contractions are making you uncomfortable: ? Change your position from lying down or resting to walking, or change from walking to resting. ? Sit and rest in a tub of warm water. ? Drink enough fluid to keep your urine pale yellow. Dehydration may cause these contractions. ? Do slow and deep breathing several  times an hour.  Keep all follow-up prenatal visits as told by your health care provider. This is important. Contact a health care provider if:  You have a fever.  You have continuous pain in your abdomen. Get help right away if:  Your contractions become stronger, more regular, and closer together.  You have fluid leaking or gushing from your vagina.  You pass blood-tinged mucus (bloody show).  You have bleeding from your vagina.  You have low back pain that you never had before.  You feel your baby's head pushing down and causing pelvic pressure.  Your baby is not moving inside you as much as it used to. Summary  Contractions that occur before labor are called Braxton Hicks contractions, false labor, or practice contractions.  Braxton Hicks contractions are usually shorter, weaker, farther apart, and less regular than true labor contractions. True labor contractions usually become progressively stronger and regular, and they become more frequent.  Manage discomfort from [facility] contractions by changing position, resting in a warm bath, drinking plenty of water, or practicing deep breathing. This information is not intended to replace advice given to you by your health care provider. Make sure you discuss any questions you have with your health care provider. Document Released: 03/30/2017 Document Revised: 08/29/2017 Document Reviewed: 03/30/2017 Elsevier Interactive Patient Education  2019 [employer].  Coronavirus (COVID-19) Are you at risk?  Are you at risk for the Coronavirus (COVID-19)?  To be considered HIGH RISK for Coronavirus (COVID-19), you have to meet the following criteria:  . Traveled to [place], [place], [place], [place] or [place]; or in the [place] to [place], [place], [place], or [place]; and have fever, cough, and shortness of breath within the last 2 weeks of travel OR . Been in close contact with a person diagnosed with COVID-19 within  the last 2 weeks and have fever, cough, and shortness of breath . IF YOU DO NOT MEET THESE CRITERIA, YOU ARE CONSIDERED LOW RISK FOR COVID-19.  What to do if you are HIGH RISK for COVID-19?  Marland Kitchen If you are having a medical emergency, call 911. . Seek medical care right away. Before you go to a doctor's office, urgent care or emergency department, call ahead and tell them about your recent travel, contact with someone diagnosed with COVID-19, and your symptoms. You should receive instructions from your physician's office regarding next steps of care.  . When you arrive at healthcare provider, tell the healthcare staff immediately you have returned from visiting [place], [place], [place], [place] or [place]; or traveled in the [place] to [place], [place], [place], or [place]; in the last two weeks or you have been in close contact with a person diagnosed with COVID-19 in the last 2 weeks.   . Tell the health care staff about your symptoms: fever, cough and shortness of breath. . After you have been seen by a medical provider, you will be either: o Tested for (COVID-19) and discharged home on quarantine except to  seek medical care if symptoms worsen, and asked to  - Stay home and avoid contact with others until you get your results (4-5 days)  - Avoid travel on public transportation if possible (such as bus, train, or airplane) or o Sent to the Emergency Department by EMS for evaluation, COVID-19 testing, and possible admission depending on your condition and test results.  What to do if you are LOW RISK for COVID-19?  Reduce your risk of any infection by using the same precautions used for avoiding the common cold or flu:  Marland Kitchen Wash your hands often with soap and warm water for at least 20 seconds.  If soap and water are not readily available, use an alcohol-based hand sanitizer with at least 60% alcohol.  . If coughing or sneezing, cover your mouth and nose by coughing or sneezing into the  elbow areas of your shirt or coat, into a tissue or into your sleeve (not your hands). . Avoid shaking hands with others and consider head nods or verbal greetings only. . Avoid touching your eyes, nose, or mouth with unwashed hands.  . Avoid close contact with people who are sick. . Avoid places or events with large numbers of people in one location, like concerts or sporting events. . Carefully consider travel plans you have or are making. . If you are planning any travel outside or inside the [name], visit the CDC's Travelers' Health webpage for the latest health notices. . If you have some symptoms but not all symptoms, continue to monitor at home and seek medical attention if your symptoms worsen. . If you are having a medical emergency, call 911.   ADDITIONAL HEALTHCARE OPTIONS FOR PATIENTS  [place] / e-Visit: [email or web address]         [facility]: [phone]  [facility]: [phone]                   [facility]: [phone]

## 2019-02-18 ENCOUNTER — Telehealth: Payer: Self-pay | Admitting: *Deleted

## 2019-02-18 NOTE — Telephone Encounter (Signed)
Patient informed we are not allowing any visitors during her appointment due to Covid-19.Screening questions asked.   At this time, have come in contact with someone in the last month that has been confirmed or suspected of having Covid-19? No Are you experiencing a fever, cough, SOB, muscle pain, diarrhea, rash, vomiting, abdominal pain, red eye, weakness, bruising or bleeding, joint pain or severe headache?No

## 2019-02-19 ENCOUNTER — Ambulatory Visit (INDEPENDENT_AMBULATORY_CARE_PROVIDER_SITE_OTHER): Payer: Medicaid Other | Admitting: Obstetrics & Gynecology

## 2019-02-19 ENCOUNTER — Encounter: Payer: Medicaid Other | Admitting: Obstetrics & Gynecology

## 2019-02-19 ENCOUNTER — Encounter: Payer: Self-pay | Admitting: Obstetrics & Gynecology

## 2019-02-19 ENCOUNTER — Other Ambulatory Visit: Payer: Self-pay

## 2019-02-19 VITALS — BP 137/85 | HR 77 | Wt 181.0 lb

## 2019-02-19 DIAGNOSIS — Z331 Pregnant state, incidental: Secondary | ICD-10-CM

## 2019-02-19 DIAGNOSIS — Z3A38 38 weeks gestation of pregnancy: Secondary | ICD-10-CM

## 2019-02-19 DIAGNOSIS — Z3403 Encounter for supervision of normal first pregnancy, third trimester: Secondary | ICD-10-CM

## 2019-02-19 DIAGNOSIS — Z1389 Encounter for screening for other disorder: Secondary | ICD-10-CM

## 2019-02-19 LAB — POCT URINALYSIS DIPSTICK OB
Blood, UA: NEGATIVE
Glucose, UA: NEGATIVE
KETONES UA: NEGATIVE
LEUKOCYTES UA: NEGATIVE
NITRITE UA: NEGATIVE
PROTEIN: NEGATIVE

## 2019-02-19 NOTE — Progress Notes (Signed)
   LOW-RISK PREGNANCY VISIT Patient name: Molly Burton MRN [id number]  Date of birth: 1996/05/01 Chief Complaint:   Routine Prenatal Visit  History of Present Illness:   Molly Burton is a 23 y.o. G1P0 female at [redacted]w[redacted]d with an Estimated Date of Delivery: 03/04/19 being seen today for ongoing management of a low-risk pregnancy.  Today she reports no complaints. Contractions: Not present. Vag. Bleeding: None.  Movement: Present. denies leaking of fluid. Review of Systems:   Pertinent items are noted in HPI Denies abnormal vaginal discharge w/ itching/odor/irritation, headaches, visual changes, shortness of breath, chest pain, abdominal pain, severe nausea/vomiting, or problems with urination or bowel movements unless otherwise stated above. Pertinent History Reviewed:  Reviewed past medical,surgical, social, obstetrical and family history.  Reviewed problem list, medications and allergies. Physical Assessment:   Vitals:   02/19/19 1126  BP: 137/85  Pulse: 77  Weight: 181 lb (82.1 kg)  Body mass index is 33.11 kg/m.        Physical Examination:   General appearance: Well appearing, and in no distress  Mental status: Alert, oriented to person, place, and time  Skin: Warm & dry  Cardiovascular: Normal heart rate noted  Respiratory: Normal respiratory effort, no distress  Abdomen: Soft, gravid, nontender  Pelvic: Cervical exam deferred  Dilation: Closed Effacement (%): Thick Station: -2  Extremities: Edema: Trace  Fetal Status: Fetal Heart Rate (bpm): 145 Fundal Height: 36 cm Movement: Present Presentation: Vertex  Results for orders placed or performed in visit on 02/19/19 (from the past 24 hour(s))  POC Urinalysis Dipstick OB   Collection Time: 02/19/19 11:27 AM  Result Value Ref Range   Color, UA     Clarity, UA     Glucose, UA Negative Negative   Bilirubin, UA     Ketones, UA neg    Spec Grav, UA     Blood, UA neg    pH, UA     POC,PROTEIN,UA Negative Negative, Trace,  Small (1+), Moderate (2+), Large (3+), 4+   Urobilinogen, UA     Nitrite, UA neg    Leukocytes, UA Negative Negative   Appearance     Odor      Assessment & Plan:  1) Low-risk pregnancy G1P0 at [redacted]w[redacted]d with an Estimated Date of Delivery: 03/04/19   2)    Meds: No orders of the defined types were placed in this encounter.  Labs/procedures today:   Plan:  Continue routine obstetrical care   Reviewed: Preterm labor symptoms and general obstetric precautions including but not limited to vaginal bleeding, contractions, leaking of fluid and fetal movement were reviewed in detail with the patient.  All questions were answered  Follow-up: Return in about 1 week (around 02/26/2019) for LROB.  Orders Placed This Encounter  Procedures  . POC Urinalysis Dipstick OB   Lazaro Arms 02/19/2019 11:39 AM

## 2019-02-20 ENCOUNTER — Encounter: Payer: Medicaid Other | Admitting: Obstetrics and Gynecology

## 2019-02-20 ENCOUNTER — Encounter: Payer: Medicaid Other | Admitting: Obstetrics & Gynecology

## 2019-02-27 ENCOUNTER — Other Ambulatory Visit: Payer: Self-pay

## 2019-02-27 ENCOUNTER — Encounter: Payer: Self-pay | Admitting: Obstetrics and Gynecology

## 2019-02-27 ENCOUNTER — Ambulatory Visit (INDEPENDENT_AMBULATORY_CARE_PROVIDER_SITE_OTHER): Payer: Medicaid Other | Admitting: Obstetrics and Gynecology

## 2019-02-27 VITALS — BP 131/83 | HR 71 | Temp 98.5°F | Wt 184.8 lb

## 2019-02-27 DIAGNOSIS — Z3403 Encounter for supervision of normal first pregnancy, third trimester: Secondary | ICD-10-CM

## 2019-02-27 DIAGNOSIS — Z1389 Encounter for screening for other disorder: Secondary | ICD-10-CM

## 2019-02-27 DIAGNOSIS — Z3A36 36 weeks gestation of pregnancy: Secondary | ICD-10-CM

## 2019-02-27 LAB — POCT URINALYSIS DIPSTICK OB
Glucose, UA: NEGATIVE
Ketones, UA: NEGATIVE
Nitrite, UA: NEGATIVE
POC,PROTEIN,UA: NEGATIVE

## 2019-02-27 NOTE — Progress Notes (Signed)
Patient ID: GREISY DERFLINGER, female   DOB: 06/22/1996, 23 y.o.   MRN: [id number]    LOW-RISK PREGNANCY VISIT Patient name: Molly Burton MRN [id number]  Date of birth: Aug 06, 1996 Chief Complaint:   Routine Prenatal Visit  History of Present Illness:   Molly Burton is a 23 y.o. G1P0 female at [redacted]w[redacted]d with an Estimated Date of Delivery: 03/04/19 being seen today for ongoing management of a low-risk pregnancy.  Today she reports headache. Mild occasional headaches only lasting 5 minutes, no blurry vision, vision changes  Contractions: Not present. Vag. Bleeding: None.  Movement: Present. denies leaking of fluid. Review of Systems:   Pertinent items are noted in HPI Denies abnormal vaginal discharge w/ itching/odor/irritation, visual changes, shortness of breath, chest pain, abdominal pain, severe nausea/vomiting, or problems with urination or bowel movements unless otherwise stated above. Pertinent History Reviewed:  Reviewed past medical,surgical, social, obstetrical and family history.  Reviewed problem list, medications and allergies. Physical Assessment:   Vitals:   02/27/19 0917 02/27/19 0922  BP: 140/85 131/83  Pulse: 76 71  Temp: 98.5 F (36.9 C)   Weight: 184 lb 12.8 oz (83.8 kg)   Body mass index is 33.8 kg/m.        Physical Examination:   General appearance: Well appearing, and in no distress  Mental status: Alert, oriented to person, place, and time  Skin: Warm & dry  Cardiovascular: Normal heart rate noted  Respiratory: Normal respiratory effort, no distress  Abdomen: Soft, gravid, nontender  Pelvic: Cervical exam deferred         Extremities: Edema: Trace  Reflexes: ankles 1+ knees 3+ no clonus  Fetal Status: Fetal Heart Rate (bpm): 137 Fundal Height: 39 cm Movement: Present    Results for orders placed or performed in visit on 02/27/19 (from the past 24 hour(s))  POC Urinalysis Dipstick OB   Collection Time: 02/27/19  9:17 AM  Result Value Ref Range   Color,  UA     Clarity, UA     Glucose, UA Negative Negative   Bilirubin, UA     Ketones, UA neg    Spec Grav, UA     Blood, UA trace    pH, UA     POC,PROTEIN,UA Negative Negative, Trace, Small (1+), Moderate (2+), Large (3+), 4+   Urobilinogen, UA     Nitrite, UA neg    Leukocytes, UA Trace (A) Negative   Appearance     Odor      Assessment & Plan:  1) Low-risk pregnancy G1P0 at [redacted]w[redacted]d with an Estimated Date of Delivery: 03/04/19    Meds: No orders of the defined types were placed in this encounter.  Labs/procedures today: urinalysis, urine protein negative today in office.  Plan:  1. If headaches worsen [facility] to [facility] 2. F/u in 1 week BP check & NST 3. C&S 4. Discussed concerns of pre-eclamp 5. Discussed Kick counts  Reviewed: Term labor symptoms and general obstetric precautions including but not limited to vaginal bleeding, contractions, leaking of fluid and fetal movement were reviewed in detail with the patient.  All questions were answered  Follow-up: Return in about 1 week (around 03/06/2019) for BP check, NST.  Orders Placed This Encounter  Procedures  . POC Urinalysis Dipstick OB   By signing my name below, I, Arnette Norris, attest that this documentation has been prepared under the direction and in the presence of Tilda Burrow, MD. Electronically Signed: Arnette Norris Medical Scribe. 02/27/19. 9:45 AM.  I personally performed the services described in this documentation, which was SCRIBED in my presence. The recorded information has been reviewed and considered accurate. It has been edited as necessary during review. Jonnie Kind, MD

## 2019-03-04 ENCOUNTER — Encounter: Payer: Self-pay | Admitting: Obstetrics & Gynecology

## 2019-03-04 ENCOUNTER — Other Ambulatory Visit: Payer: Self-pay

## 2019-03-04 ENCOUNTER — Ambulatory Visit (INDEPENDENT_AMBULATORY_CARE_PROVIDER_SITE_OTHER): Payer: Medicaid Other | Admitting: Obstetrics & Gynecology

## 2019-03-04 VITALS — BP 134/87 | HR 80 | Temp 98.7°F | Wt 188.0 lb

## 2019-03-04 DIAGNOSIS — Z1389 Encounter for screening for other disorder: Secondary | ICD-10-CM

## 2019-03-04 DIAGNOSIS — Z331 Pregnant state, incidental: Secondary | ICD-10-CM

## 2019-03-04 DIAGNOSIS — Z3403 Encounter for supervision of normal first pregnancy, third trimester: Secondary | ICD-10-CM

## 2019-03-04 DIAGNOSIS — Z3A40 40 weeks gestation of pregnancy: Secondary | ICD-10-CM

## 2019-03-04 DIAGNOSIS — O480 Post-term pregnancy: Secondary | ICD-10-CM

## 2019-03-04 LAB — POCT URINALYSIS DIPSTICK OB
Blood, UA: NEGATIVE
Glucose, UA: NEGATIVE
Ketones, UA: NEGATIVE
Leukocytes, UA: NEGATIVE
Nitrite, UA: NEGATIVE
POC,PROTEIN,UA: NEGATIVE

## 2019-03-04 NOTE — Progress Notes (Signed)
   LOW-RISK PREGNANCY VISIT Patient name: Molly Burton MRN [id number]  Date of birth: Dec 18, 1995 Chief Complaint:   Non-stress Test  History of Present Illness:   Molly Burton is a 23 y.o. G1P0 female at [redacted]w[redacted]d with an Estimated Date of Delivery: 03/04/19 being seen today for ongoing management of a low-risk pregnancy.  Today she reports no complaints. Contractions: Not present.  .  Movement: Present. denies leaking of fluid. Review of Systems:   Pertinent items are noted in HPI Denies abnormal vaginal discharge w/ itching/odor/irritation, headaches, visual changes, shortness of breath, chest pain, abdominal pain, severe nausea/vomiting, or problems with urination or bowel movements unless otherwise stated above. Pertinent History Reviewed:  Reviewed past medical,surgical, social, obstetrical and family history.  Reviewed problem list, medications and allergies. Physical Assessment:   Vitals:   03/04/19 1344  BP: 134/87  Pulse: 80  Temp: 98.7 F (37.1 C)  Weight: 188 lb (85.3 kg)  Body mass index is 34.39 kg/m.        Physical Examination:   General appearance: Well appearing, and in no distress  Mental status: Alert, oriented to person, place, and time  Skin: Warm & dry  Cardiovascular: Normal heart rate noted  Respiratory: Normal respiratory effort, no distress  Abdomen: Soft, gravid, nontender  Pelvic: Cervical exam deferred         Extremities: Edema: Trace  Fetal Status:     Movement: Present    Results for orders placed or performed in visit on 03/04/19 (from the past 24 hour(s))  POC Urinalysis Dipstick OB   Collection Time: 03/04/19  1:50 PM  Result Value Ref Range   Color, UA     Clarity, UA     Glucose, UA Negative Negative   Bilirubin, UA     Ketones, UA neg    Spec Grav, UA     Blood, UA neg    pH, UA     POC,PROTEIN,UA Negative Negative, Trace, Small (1+), Moderate (2+), Large (3+), 4+   Urobilinogen, UA     Nitrite, UA neg    Leukocytes, UA  Negative Negative   Appearance     Odor      Assessment & Plan:  1) Low-risk pregnancy G1P0 at [redacted]w[redacted]d with an Estimated Date of Delivery: 03/04/19   2) Impending post dates with reactive NST   Meds: No orders of the defined types were placed in this encounter.  Labs/procedures today:   [name] is at [redacted]w[redacted]d Estimated Date of Delivery: 03/04/19  NST being performed due to EGA [redacted] weeks  Today the NST is Reactive  Fetal Monitoring:  Baseline: 135 bpm, Variability: Good {> 6 bpm), Accelerations: Reactive and Decelerations: Absent   reactive  The accelerations are >15 bpm and more than 2 in 20 minutes  Final diagnosis:  Reactive NST  Lazaro Arms, MD     Plan:  Continue routine obstetrical care IOL 03/11/2019  Reviewed: Term labor symptoms and general obstetric precautions including but not limited to vaginal bleeding, contractions, leaking of fluid and fetal movement were reviewed in detail with the patient.  All questions were answered  Follow-up: Return in about 6 weeks (around 04/15/2019) for post partum visit.  Orders Placed This Encounter  Procedures  . POC Urinalysis Dipstick OB   Lazaro Arms  03/04/2019 2:32 PM

## 2019-03-05 ENCOUNTER — Encounter (HOSPITAL_COMMUNITY): Payer: Self-pay | Admitting: *Deleted

## 2019-03-05 ENCOUNTER — Telehealth (HOSPITAL_COMMUNITY): Payer: Self-pay | Admitting: *Deleted

## 2019-03-05 NOTE — Telephone Encounter (Signed)
Preadmission screen  

## 2019-03-05 NOTE — Addendum Note (Signed)
Addended by: Lazaro Arms on: 03/05/2019 03:11 PM   Modules accepted: Orders, SmartSet

## 2019-03-05 NOTE — Treatment Plan (Signed)
   Induction Assessment Scheduling Form: [facility] to [facility]:  [phone]  Molly Burton                                                                                   DOB:  03/27/1996                                                            MRN:  [id number]                                                                     Phone #:         [phone]                   Provider:  [facility]  GP:  G1P0                                                            Estimated Date of Delivery: 03/04/19  Dating Criteria: 6 week sonogram    Medical Indications for induction:  Post dates Admission Date/Time:  03/11/2019@0700  Gestational age on admission:  [redacted]w[redacted]d   Filed Weights   03/04/19 1344  Weight: 188 lb (85.3 kg)   HIV:  Non Reactive (01/06 3151) [id number]    Cervical exam deferred   Method of induction(proposed):  choice   Scheduling Provider Signature:  Lazaro Arms, MD                                            Today's Date:  03/05/2019

## 2019-03-06 ENCOUNTER — Other Ambulatory Visit: Payer: Medicaid Other | Admitting: Obstetrics and Gynecology

## 2019-03-08 ENCOUNTER — Other Ambulatory Visit (HOSPITAL_COMMUNITY): Payer: Self-pay | Admitting: *Deleted

## 2019-03-11 ENCOUNTER — Other Ambulatory Visit: Payer: Self-pay

## 2019-03-11 ENCOUNTER — Encounter (HOSPITAL_COMMUNITY): Payer: Self-pay | Admitting: *Deleted

## 2019-03-11 ENCOUNTER — Inpatient Hospital Stay (HOSPITAL_COMMUNITY)
Admission: AD | Admit: 2019-03-11 | Discharge: 2019-03-14 | DRG: 788 | Disposition: A | Discharge status: Home / Self Care | Payer: Medicaid Other | Attending: Family Medicine | Admitting: Family Medicine

## 2019-03-11 ENCOUNTER — Inpatient Hospital Stay (HOSPITAL_COMMUNITY): Payer: Medicaid Other

## 2019-03-11 DIAGNOSIS — E0590 Thyrotoxicosis, unspecified without thyrotoxic crisis or storm: Secondary | ICD-10-CM | POA: Diagnosis not present

## 2019-03-11 DIAGNOSIS — O480 Post-term pregnancy: Secondary | ICD-10-CM | POA: Diagnosis not present

## 2019-03-11 DIAGNOSIS — Z3A41 41 weeks gestation of pregnancy: Secondary | ICD-10-CM

## 2019-03-11 DIAGNOSIS — O99284 Endocrine, nutritional and metabolic diseases complicating childbirth: Secondary | ICD-10-CM | POA: Diagnosis not present

## 2019-03-11 LAB — CBC
HCT: 38.9 % (ref 36.0–46.0)
Hemoglobin: 12.8 g/dL (ref 12.0–15.0)
MCH: 31 pg (ref 26.0–34.0)
MCHC: 32.9 g/dL (ref 30.0–36.0)
MCV: 94.2 fL (ref 80.0–100.0)
Platelets: 180 10*3/uL (ref 150–400)
RBC: 4.13 MIL/uL (ref 3.87–5.11)
RDW: 13 % (ref 11.5–15.5)
WBC: 11.1 10*3/uL — ABNORMAL HIGH (ref 4.0–10.5)
nRBC: 0 % (ref 0.0–0.2)

## 2019-03-11 LAB — TYPE AND SCREEN
ABO/RH(D): O POS
Antibody Screen: NEGATIVE

## 2019-03-11 LAB — RPR: RPR Ser Ql: NONREACTIVE

## 2019-03-11 LAB — ABO/RH: ABO/RH(D): O POS

## 2019-03-11 MED ORDER — MISOPROSTOL 50MCG HALF TABLET
50.0000 ug | ORAL_TABLET | ORAL | Status: DC
  Administered 2019-03-11 – 2019-03-12 (×4): 50 ug via ORAL
  Filled 2019-03-11 (×4): qty 1

## 2019-03-11 MED ORDER — OXYTOCIN 40 UNITS IN NORMAL SALINE INFUSION - SIMPLE MED: 2.5000 [IU]/h | INTRAVENOUS | Status: DC

## 2019-03-11 MED ORDER — SOD CITRATE-CITRIC ACID 500-334 MG/5ML PO SOLN
30.0000 mL | ORAL | Status: DC | PRN
  Administered 2019-03-12: 16:00:00 15 mL via ORAL
  Filled 2019-03-11: qty 15

## 2019-03-11 MED ORDER — TERBUTALINE SULFATE 1 MG/ML IJ SOLN
0.2500 mg | Freq: Once | INTRAMUSCULAR | Status: AC | PRN
  Administered 2019-03-12: 0.25 mg via SUBCUTANEOUS

## 2019-03-11 MED ORDER — OXYCODONE-ACETAMINOPHEN 5-325 MG PO TABS: 2.0000 | ORAL_TABLET | ORAL | Status: DC | PRN

## 2019-03-11 MED ORDER — LIDOCAINE HCL (PF) 1 % IJ SOLN
30.0000 mL | INTRAMUSCULAR | Status: DC | PRN
  Filled 2019-03-11: qty 30

## 2019-03-11 MED ORDER — OXYTOCIN BOLUS FROM INFUSION: 500.0000 mL | Freq: Once | INTRAVENOUS | Status: DC

## 2019-03-11 MED ORDER — MISOPROSTOL 25 MCG QUARTER TABLET
25.0000 ug | ORAL_TABLET | ORAL | Status: DC | PRN
  Administered 2019-03-11: 25 ug via VAGINAL
  Filled 2019-03-11: qty 1

## 2019-03-11 MED ORDER — ONDANSETRON HCL 4 MG/2ML IJ SOLN
4.0000 mg | Freq: Four times a day (QID) | INTRAMUSCULAR | Status: DC | PRN
  Administered 2019-03-12: 4 mg via INTRAVENOUS
  Filled 2019-03-11: qty 2

## 2019-03-11 MED ORDER — ACETAMINOPHEN 325 MG PO TABS
650.0000 mg | ORAL_TABLET | ORAL | Status: DC | PRN
  Administered 2019-03-12: 650 mg via ORAL
  Filled 2019-03-11: qty 2

## 2019-03-11 MED ORDER — OXYCODONE-ACETAMINOPHEN 5-325 MG PO TABS: 1.0000 | ORAL_TABLET | ORAL | Status: DC | PRN

## 2019-03-11 MED ORDER — LACTATED RINGERS IV SOLN: 500.0000 mL | INTRAVENOUS | Status: DC | PRN

## 2019-03-11 MED ORDER — LACTATED RINGERS IV SOLN
INTRAVENOUS | Status: DC
  Administered 2019-03-11 – 2019-03-12 (×7): via INTRAVENOUS

## 2019-03-11 MED ORDER — OXYTOCIN 40 UNITS IN NORMAL SALINE INFUSION - SIMPLE MED
1.0000 m[IU]/min | INTRAVENOUS | Status: DC
  Administered 2019-03-12: 2 m[IU]/min via INTRAVENOUS
  Filled 2019-03-11: qty 1000

## 2019-03-11 MED ORDER — DIPHENHYDRAMINE HCL 50 MG/ML IJ SOLN
12.5000 mg | INTRAMUSCULAR | Status: DC | PRN
  Administered 2019-03-12: 25 mg via INTRAVENOUS
  Filled 2019-03-11: qty 1

## 2019-03-11 MED ORDER — PHENYLEPHRINE 40 MCG/ML (10ML) SYRINGE FOR IV PUSH (FOR BLOOD PRESSURE SUPPORT): 80.0000 ug | PREFILLED_SYRINGE | INTRAVENOUS | Status: DC | PRN

## 2019-03-11 MED ORDER — EPHEDRINE 5 MG/ML INJ: 10.0000 mg | INTRAVENOUS | Status: DC | PRN

## 2019-03-11 MED ORDER — LACTATED RINGERS IV SOLN
500.0000 mL | Freq: Once | INTRAVENOUS | Status: AC
  Administered 2019-03-12: 500 mL via INTRAVENOUS

## 2019-03-11 MED ORDER — FENTANYL-BUPIVACAINE-NACL 0.5-0.125-0.9 MG/250ML-% EP SOLN
12.0000 mL/h | EPIDURAL | Status: DC | PRN
  Filled 2019-03-11: qty 250

## 2019-03-11 NOTE — Progress Notes (Signed)
LABOR PROGRESS NOTE  Molly Burton is a 23 y.o. G1P0 at [redacted]w[redacted]d  admitted for IOL for postdates.   Subjective: Strip note. Discussed plan of care with RN.   Objective: BP 129/70   Pulse 79   Temp 98.7 F (37.1 C)   Resp 16   Ht 5\' 2"  (1.575 m)   Wt 85.7 kg   LMP  (LMP Unknown)   BMI 34.57 kg/m  or  Vitals:   03/11/19 0708 03/11/19 1251  BP: 134/83 129/70  Pulse: 78 79  Resp: 16   Temp: 97.8 F (36.6 C) 98.7 F (37.1 C)  Weight: 85.7 kg   Height: 5\' 2"  (1.575 m)    Dilation: Closed Effacement (%): 50 Cervical Position: Posterior Station: -2 Presentation: Vertex Exam by:: m wilkins rnc FHT: baseline rate 125, moderate varibility, +acel, no decel Toco: q4 min   Labs: Lab Results  Component Value Date   WBC 11.1 (H) 03/11/2019   HGB 12.8 03/11/2019   HCT 38.9 03/11/2019   MCV 94.2 03/11/2019   PLT 180 03/11/2019    Patient Active Problem List   Diagnosis Date Noted  . Post-dates pregnancy 03/11/2019  . Supervision of normal first pregnancy 08/01/2018  . Arthralgia   . Hyperthyroidism 02/08/2018    Assessment / Plan: 23 y.o. G1P0 at [redacted]w[redacted]d here for IOL for postdates.   Labor: Induction. Patient remains closed. Continue with cytotec, s/p 2 doses. Will plan to place FB when cervix more favorable.  Fetal Wellbeing:  Cat I  Pain Control:  Maternal support  Anticipated MOD:  NSVD   Marcy Siren, D.O. OB Fellow  03/11/2019, 3:46 PM

## 2019-03-11 NOTE — H&P (Signed)
OBSTETRIC ADMISSION HISTORY AND PHYSICAL  Molly Burton is a 23 y.o. female G1P0 with IUP at 109w0d by 1st trimester u/s presenting for IOL for postdates. She reports +FMs, No LOF, no VB, no blurry vision, headaches or peripheral edema, and RUQ pain.  She plans on breast feeding. She plans condoms for birth control. She received her prenatal care at [facility]   Dating: By 1st trimester u/s --->  Estimated Date of Delivery: 03/04/19   FAMILY TREE  LAB RESULTS  Language English Pap 10.2.19  Initiated care at [facility] GC/CT Initial:  -/-          36wks:  -/-  Dating by 7wk U/S    Support person Molly Burton, Molly Burton Genetics NT/IT:  Normal NT, didn't have IT AFP: CfDNA: normal female    [place]/HgbE neg  Flu vaccine 08/29/18 CF declined  TDaP vaccine 12/31/18 SMA   Rhogam       Blood Type O/Positive/-- (09/04 1517)  Anatomy US Normal female  Antibody Negative (09/04 1517)  Feeding Plan breast HBsAg Negative (09/04 1517)  Contraception condoms RPR Non Reactive (09/04 1517)  Circumcision Yes, undecided on where? Rubella  1.64 (09/04 1517)  Pediatrician List given HIV Non Reactive (09/04 1517)  Prenatal Classes Info given      GTT/A1C Early:      26-28wks:  BTL Consent  GBS   neg     negative  VBAC Consent     Waterbirth [ ] Class [ ] Consent [ ] CNM visit PP Needs       Prenatal History/Complications:  Past Medical History: Past Medical History:  Diagnosis Date  . Chronic kidney disease    UTI    Past Surgical History: Past Surgical History:  Procedure Laterality Date  . TONSILLECTOMY      Obstetrical History: OB History    Gravida  1   Para      Term      Preterm      AB      Living        SAB      TAB      Ectopic      Multiple      Live Births              Social History: Social History   Socioeconomic History  . Marital status: Single    Spouse name: Not on file  . Number of children: 0  . Years of education: N/A  . Highest education level:  Bachelor's degree (e.g., BA, AB, BS)  Occupational History  . Not on file  Social Needs  . Financial resource strain: Not hard at all  . Food insecurity:    Worry: Never true    Inability: Never true  . Transportation needs:    Medical: No    Non-medical: No  Tobacco Use  . Smoking status: Never Smoker  . Smokeless tobacco: Never Used  Substance and Sexual Activity  . Alcohol use: Not Currently    Comment: Pt did have wine on 08/05/2018  . Drug use: Not Currently    Types: Marijuana    Comment: not since 07/03/18  . Sexual activity: Not Currently    Birth control/protection: None  Lifestyle  . Physical activity:    Days per week: 0 days    Minutes per session: 0 min  . Stress: Only a little  Relationships  . Social connections:    Talks on phone: More than three times a week  Gets together: More than three times a week    Attends religious service: More than 4 times per year    Active member of club or organization: Yes    Attends meetings of clubs or organizations: 1 to 4 times per year    Relationship status: Never married  Other Topics Concern  . Not on file  Social History Narrative  . Not on file    Family History: Family History  Problem Relation Age of Onset  . Thyroid disease Maternal Grandmother   . Thyroid disease Paternal Grandmother     Allergies: No Known Allergies  Medications Prior to Admission  Medication Sig Dispense Refill Last Dose  . Prenat w/o A-[id number] (CITRANATAL ASSURE) 35-1 & 300 MG tablet One tablet and one capsule daily (Patient taking differently: Take 2 tablets by mouth See admin instructions. Take 1 tablet and 1 capsule by mouth daily) 60 tablet 11 Taking     Review of Systems   All systems reviewed and negative except as stated in HPI  Blood pressure 134/83, pulse 78, temperature 97.8 F (36.6 C), resp. rate 16, height 5\' 2"  (1.575 m), weight 85.7 kg, unknown if currently breastfeeding. General appearance: alert,  cooperative and no distress Lungs: clear to auscultation bilaterally Heart: regular rate and rhythm Abdomen: soft, non-tender; bowel sounds normal Pelvic: n/a Extremities: Homans sign is negative, no sign of DVT DTR's +2 Presentation: cephalic Fetal monitoringBaseline: 130 bpm, Variability: Good {> 6 bpm), Accelerations: Reactive and Decelerations: Absent Uterine activity: occasional uc's     Prenatal labs: ABO, Rh: O/Positive/-- (09/04 1517) Antibody: Negative (01/06 0852) Rubella: 1.64 (09/04 1517) RPR: Non Reactive (01/06 0852)  HBsAg: Negative (09/04 1517)  HIV: Non Reactive (01/06 0852)  GBS: Negative (03/13 0000)   Prenatal Transfer Tool  Maternal Diabetes: No Genetic Screening: Normal Maternal Ultrasounds/Referrals: Normal Fetal Ultrasounds or other Referrals:  None Maternal Substance Abuse:  No Significant Maternal Medications:  None Significant Maternal Lab Results: Lab values include: Group B Strep negative  No results found for this or any previous visit (from the past 24 hour(s)).  Patient Active Problem List   Diagnosis Date Noted  . Post-dates pregnancy 03/11/2019  . Supervision of normal first pregnancy 08/01/2018  . Arthralgia   . Hyperthyroidism 02/08/2018    Assessment/Plan:  Molly Burton is a 23 y.o. G1P0 at 10924w0d here for IOL for postdates  #Labor: Cytotec for cervical ripening. Discussed with patient FB when able.  #Pain: Planning epidural #FWB: Cat 1 #ID:  GBS neg #MOF: Breast #MOC: Unsure #Circ:  Outpatient  Rolm BookbinderCaroline M Raja Burton, CNM  03/11/2019, 7:29 AM

## 2019-03-12 ENCOUNTER — Encounter (HOSPITAL_COMMUNITY)
Admission: AD | Disposition: A | Discharge status: Home / Self Care | Payer: Self-pay | Source: Home / Self Care | Attending: Family Medicine

## 2019-03-12 ENCOUNTER — Inpatient Hospital Stay (HOSPITAL_COMMUNITY): Payer: Medicaid Other | Admitting: Anesthesiology

## 2019-03-12 ENCOUNTER — Encounter (HOSPITAL_COMMUNITY): Payer: Self-pay | Admitting: *Deleted

## 2019-03-12 DIAGNOSIS — O480 Post-term pregnancy: Secondary | ICD-10-CM

## 2019-03-12 DIAGNOSIS — Z3A41 41 weeks gestation of pregnancy: Secondary | ICD-10-CM

## 2019-03-12 LAB — COMPREHENSIVE METABOLIC PANEL
ALT: 18 U/L (ref 0–44)
AST: 23 U/L (ref 15–41)
Albumin: 3.1 g/dL — ABNORMAL LOW (ref 3.5–5.0)
Alkaline Phosphatase: 118 U/L (ref 38–126)
Anion gap: 8 (ref 5–15)
BUN: 9 mg/dL (ref 6–20)
CO2: 23 mmol/L (ref 22–32)
Calcium: 8.9 mg/dL (ref 8.9–10.3)
Chloride: 105 mmol/L (ref 98–111)
Creatinine, Ser: 0.98 mg/dL (ref 0.44–1.00)
GFR calc Af Amer: >60 mL/min (ref >60)
GFR calc non Af Amer: >60 mL/min (ref >60)
Glucose, Bld: 129 mg/dL — ABNORMAL HIGH (ref 70–99)
Potassium: 4 mmol/L (ref 3.5–5.1)
Sodium: 136 mmol/L (ref 135–145)
Total Bilirubin: 0.9 mg/dL (ref 0.3–1.2)
Total Protein: 6 g/dL — ABNORMAL LOW (ref 6.5–8.1)

## 2019-03-12 LAB — CBC
HCT: 37.7 % (ref 36.0–46.0)
Hemoglobin: 12.6 g/dL (ref 12.0–15.0)
MCH: 31.7 pg (ref 26.0–34.0)
MCHC: 33.4 g/dL (ref 30.0–36.0)
MCV: 94.7 fL (ref 80.0–100.0)
Platelets: 174 10*3/uL (ref 150–400)
RBC: 3.98 MIL/uL (ref 3.87–5.11)
RDW: 12.9 % (ref 11.5–15.5)
WBC: 15.6 10*3/uL — ABNORMAL HIGH (ref 4.0–10.5)
nRBC: 0 % (ref 0.0–0.2)

## 2019-03-12 SURGERY — Surgical Case
Anesthesia: Epidural | Site: Abdomen | Wound class: Clean Contaminated

## 2019-03-12 MED ORDER — MORPHINE SULFATE (PF) 0.5 MG/ML IJ SOLN
INTRAMUSCULAR | Status: AC
  Filled 2019-03-12: qty 10

## 2019-03-12 MED ORDER — SIMETHICONE 80 MG PO CHEW
80.0000 mg | CHEWABLE_TABLET | Freq: Three times a day (TID) | ORAL | Status: DC
  Administered 2019-03-13 – 2019-03-14 (×3): 80 mg via ORAL
  Filled 2019-03-12 (×3): qty 1

## 2019-03-12 MED ORDER — DIPHENHYDRAMINE HCL 25 MG PO CAPS: 25.0000 mg | ORAL_CAPSULE | Freq: Four times a day (QID) | ORAL | Status: DC | PRN

## 2019-03-12 MED ORDER — SCOPOLAMINE 1 MG/3DAYS TD PT72
MEDICATED_PATCH | TRANSDERMAL | Status: DC | PRN
  Administered 2019-03-12: 1 via TRANSDERMAL

## 2019-03-12 MED ORDER — STERILE WATER FOR IRRIGATION IR SOLN
Status: DC | PRN
  Administered 2019-03-12: 1

## 2019-03-12 MED ORDER — PRENATAL MULTIVITAMIN CH
1.0000 | ORAL_TABLET | Freq: Every day | ORAL | Status: DC
  Administered 2019-03-13 – 2019-03-14 (×2): 1 via ORAL
  Filled 2019-03-12 (×2): qty 1

## 2019-03-12 MED ORDER — WITCH HAZEL-GLYCERIN EX PADS: 1.0000 "application " | MEDICATED_PAD | CUTANEOUS | Status: DC | PRN

## 2019-03-12 MED ORDER — LIDOCAINE-EPINEPHRINE (PF) 2 %-1:200000 IJ SOLN
INTRAMUSCULAR | Status: DC | PRN
  Administered 2019-03-12 (×3): 5 mL via EPIDURAL
  Administered 2019-03-12: 4 mL via EPIDURAL

## 2019-03-12 MED ORDER — NALBUPHINE HCL 10 MG/ML IJ SOLN: 5.0000 mg | Freq: Once | INTRAMUSCULAR | Status: DC | PRN

## 2019-03-12 MED ORDER — ACETAMINOPHEN 500 MG PO TABS
1000.0000 mg | ORAL_TABLET | Freq: Four times a day (QID) | ORAL | Status: DC
  Administered 2019-03-12 – 2019-03-14 (×6): 1000 mg via ORAL
  Filled 2019-03-12 (×7): qty 2

## 2019-03-12 MED ORDER — SCOPOLAMINE 1 MG/3DAYS TD PT72
MEDICATED_PATCH | TRANSDERMAL | Status: AC
  Filled 2019-03-12: qty 1

## 2019-03-12 MED ORDER — OXYCODONE HCL 5 MG/5ML PO SOLN: 5.0000 mg | Freq: Once | ORAL | Status: DC | PRN

## 2019-03-12 MED ORDER — METOCLOPRAMIDE HCL 5 MG/ML IJ SOLN
INTRAMUSCULAR | Status: DC | PRN
  Administered 2019-03-12: 10 mg via INTRAVENOUS

## 2019-03-12 MED ORDER — MEPERIDINE HCL 25 MG/ML IJ SOLN: 6.2500 mg | INTRAMUSCULAR | Status: DC | PRN

## 2019-03-12 MED ORDER — DEXAMETHASONE SODIUM PHOSPHATE 4 MG/ML IJ SOLN
INTRAMUSCULAR | Status: DC | PRN
  Administered 2019-03-12: 4 mg via INTRAVENOUS

## 2019-03-12 MED ORDER — OXYTOCIN 40 UNITS IN NORMAL SALINE INFUSION - SIMPLE MED: 2.5000 [IU]/h | INTRAVENOUS | Status: AC

## 2019-03-12 MED ORDER — ZOLPIDEM TARTRATE 5 MG PO TABS: 5.0000 mg | ORAL_TABLET | Freq: Every evening | ORAL | Status: DC | PRN

## 2019-03-12 MED ORDER — FENTANYL CITRATE (PF) 100 MCG/2ML IJ SOLN
INTRAMUSCULAR | Status: AC
  Filled 2019-03-12: qty 2

## 2019-03-12 MED ORDER — LIDOCAINE-EPINEPHRINE (PF) 2 %-1:200000 IJ SOLN
INTRAMUSCULAR | Status: AC
  Filled 2019-03-12: qty 10

## 2019-03-12 MED ORDER — ONDANSETRON HCL 4 MG/2ML IJ SOLN
INTRAMUSCULAR | Status: DC | PRN
  Administered 2019-03-12: 4 mg via INTRAVENOUS

## 2019-03-12 MED ORDER — OXYTOCIN 40 UNITS IN NORMAL SALINE INFUSION - SIMPLE MED
INTRAVENOUS | Status: AC
  Filled 2019-03-12: qty 1000

## 2019-03-12 MED ORDER — ENOXAPARIN SODIUM 40 MG/0.4ML ~~LOC~~ SOLN
40.0000 mg | SUBCUTANEOUS | Status: DC
  Administered 2019-03-13: 40 mg via SUBCUTANEOUS
  Filled 2019-03-12: qty 0.4

## 2019-03-12 MED ORDER — LACTATED RINGERS IV SOLN: INTRAVENOUS | Status: DC

## 2019-03-12 MED ORDER — DIPHENHYDRAMINE HCL 50 MG/ML IJ SOLN: 25.0000 mg | Freq: Once | INTRAMUSCULAR | Status: DC

## 2019-03-12 MED ORDER — MORPHINE SULFATE (PF) 0.5 MG/ML IJ SOLN
INTRAMUSCULAR | Status: DC | PRN
  Administered 2019-03-12: 3 mg via EPIDURAL

## 2019-03-12 MED ORDER — CEFAZOLIN SODIUM-DEXTROSE 2-4 GM/100ML-% IV SOLN
2.0000 g | Freq: Once | INTRAVENOUS | Status: AC
  Administered 2019-03-12: 16:00:00 2 g via INTRAVENOUS

## 2019-03-12 MED ORDER — FUROSEMIDE 10 MG/ML IJ SOLN
20.0000 mg | Freq: Once | INTRAMUSCULAR | Status: AC
  Administered 2019-03-12: 20 mg via INTRAVENOUS
  Filled 2019-03-12: qty 2

## 2019-03-12 MED ORDER — SENNOSIDES-DOCUSATE SODIUM 8.6-50 MG PO TABS
2.0000 | ORAL_TABLET | ORAL | Status: DC
  Administered 2019-03-12 – 2019-03-13 (×2): 2 via ORAL
  Filled 2019-03-12 (×2): qty 2

## 2019-03-12 MED ORDER — SODIUM CHLORIDE (PF) 0.9 % IJ SOLN
INTRAMUSCULAR | Status: DC | PRN
  Administered 2019-03-12: 14 mL/h via EPIDURAL

## 2019-03-12 MED ORDER — SIMETHICONE 80 MG PO CHEW
80.0000 mg | CHEWABLE_TABLET | ORAL | Status: DC
  Administered 2019-03-12 – 2019-03-13 (×2): 80 mg via ORAL
  Filled 2019-03-12 (×2): qty 1

## 2019-03-12 MED ORDER — SODIUM CHLORIDE 0.9 % IV SOLN
500.0000 mg | Freq: Once | INTRAVENOUS | Status: AC
  Administered 2019-03-12: 500 mg via INTRAVENOUS

## 2019-03-12 MED ORDER — SODIUM CHLORIDE 0.9 % IV SOLN
INTRAVENOUS | Status: AC
  Filled 2019-03-12: qty 500

## 2019-03-12 MED ORDER — MENTHOL 3 MG MT LOZG: 1.0000 | LOZENGE | OROMUCOSAL | Status: DC | PRN

## 2019-03-12 MED ORDER — LACTATED RINGERS AMNIOINFUSION: INTRAVENOUS | Status: DC

## 2019-03-12 MED ORDER — COCONUT OIL OIL: 1.0000 "application " | TOPICAL_OIL | Status: DC | PRN

## 2019-03-12 MED ORDER — TERBUTALINE SULFATE 1 MG/ML IJ SOLN
INTRAMUSCULAR | Status: AC
  Filled 2019-03-12: qty 1

## 2019-03-12 MED ORDER — NALBUPHINE HCL 10 MG/ML IJ SOLN: 5.0000 mg | INTRAMUSCULAR | Status: DC | PRN

## 2019-03-12 MED ORDER — ONDANSETRON HCL 4 MG/2ML IJ SOLN: 4.0000 mg | Freq: Once | INTRAMUSCULAR | Status: DC | PRN

## 2019-03-12 MED ORDER — OXYCODONE HCL 5 MG PO TABS: 5.0000 mg | ORAL_TABLET | Freq: Once | ORAL | Status: DC | PRN

## 2019-03-12 MED ORDER — MEASLES, MUMPS & RUBELLA VAC IJ SOLR: 0.5000 mL | Freq: Once | INTRAMUSCULAR | Status: DC

## 2019-03-12 MED ORDER — DIPHENHYDRAMINE HCL 25 MG PO CAPS: 25.0000 mg | ORAL_CAPSULE | ORAL | Status: DC | PRN

## 2019-03-12 MED ORDER — ONDANSETRON HCL 4 MG/2ML IJ SOLN
INTRAMUSCULAR | Status: AC
  Filled 2019-03-12: qty 2

## 2019-03-12 MED ORDER — SIMETHICONE 80 MG PO CHEW: 80.0000 mg | CHEWABLE_TABLET | ORAL | Status: DC | PRN

## 2019-03-12 MED ORDER — DIPHENHYDRAMINE HCL 50 MG/ML IJ SOLN: 12.5000 mg | INTRAMUSCULAR | Status: DC | PRN

## 2019-03-12 MED ORDER — FENTANYL CITRATE (PF) 100 MCG/2ML IJ SOLN
100.0000 ug | INTRAMUSCULAR | Status: DC | PRN
  Administered 2019-03-12 (×3): 100 ug via INTRAVENOUS
  Filled 2019-03-12 (×3): qty 2

## 2019-03-12 MED ORDER — DEXAMETHASONE SODIUM PHOSPHATE 4 MG/ML IJ SOLN
INTRAMUSCULAR | Status: AC
  Filled 2019-03-12: qty 1

## 2019-03-12 MED ORDER — GABAPENTIN 100 MG PO CAPS
100.0000 mg | ORAL_CAPSULE | Freq: Two times a day (BID) | ORAL | Status: DC
  Administered 2019-03-12 – 2019-03-14 (×4): 100 mg via ORAL
  Filled 2019-03-12 (×4): qty 1

## 2019-03-12 MED ORDER — OXYTOCIN 10 UNIT/ML IJ SOLN
INTRAMUSCULAR | Status: DC | PRN
  Administered 2019-03-12: 40 [IU] via INTRAMUSCULAR

## 2019-03-12 MED ORDER — SODIUM CHLORIDE 0.9 % IV SOLN
INTRAVENOUS | Status: DC | PRN
  Administered 2019-03-12: 16:00:00 via INTRAVENOUS

## 2019-03-12 MED ORDER — FENTANYL CITRATE (PF) 100 MCG/2ML IJ SOLN: 25.0000 ug | INTRAMUSCULAR | Status: DC | PRN

## 2019-03-12 MED ORDER — FENTANYL CITRATE (PF) 100 MCG/2ML IJ SOLN
INTRAMUSCULAR | Status: DC | PRN
  Administered 2019-03-12: 50 ug via EPIDURAL

## 2019-03-12 MED ORDER — DIBUCAINE (PERIANAL) 1 % EX OINT: 1.0000 "application " | TOPICAL_OINTMENT | CUTANEOUS | Status: DC | PRN

## 2019-03-12 MED ORDER — METOCLOPRAMIDE HCL 5 MG/ML IJ SOLN
INTRAMUSCULAR | Status: AC
  Filled 2019-03-12: qty 2

## 2019-03-12 MED ORDER — TETANUS-DIPHTH-ACELL PERTUSSIS 5-2.5-18.5 LF-MCG/0.5 IM SUSP: 0.5000 mL | Freq: Once | INTRAMUSCULAR | Status: DC

## 2019-03-12 MED ORDER — PHENYLEPHRINE HCL (PRESSORS) 10 MG/ML IV SOLN
INTRAVENOUS | Status: DC | PRN
  Administered 2019-03-12: 40 ug via INTRAVENOUS
  Administered 2019-03-12: 80 ug via INTRAVENOUS
  Administered 2019-03-12: 40 ug via INTRAVENOUS

## 2019-03-12 MED ORDER — OXYCODONE HCL 5 MG PO TABS
5.0000 mg | ORAL_TABLET | ORAL | Status: DC | PRN
  Administered 2019-03-14: 5 mg via ORAL
  Filled 2019-03-12: qty 1

## 2019-03-12 MED ORDER — SODIUM CHLORIDE 0.9 % IR SOLN
Status: DC | PRN
  Administered 2019-03-12: 1

## 2019-03-12 SURGICAL SUPPLY — 36 items
APL SKNCLS STERI-STRIP NONHPOA (GAUZE/BANDAGES/DRESSINGS) ×1
BENZOIN TINCTURE PRP APPL 2/3 (GAUZE/BANDAGES/DRESSINGS) ×3 IMPLANT
CHLORAPREP W/TINT 26ML (MISCELLANEOUS) ×3 IMPLANT
CLAMP CORD UMBIL (MISCELLANEOUS) IMPLANT
CLOSURE STERI STRIP 1/2 X4 (GAUZE/BANDAGES/DRESSINGS) ×2 IMPLANT
CLOSURE WOUND 1/2 X4 (GAUZE/BANDAGES/DRESSINGS) ×1
CLOTH BEACON ORANGE TIMEOUT ST (SAFETY) ×3 IMPLANT
DRSG OPSITE POSTOP 4X10 (GAUZE/BANDAGES/DRESSINGS) ×3 IMPLANT
ELECT REM PT RETURN 9FT ADLT (ELECTROSURGICAL) ×3
ELECTRODE REM PT RTRN 9FT ADLT (ELECTROSURGICAL) ×1 IMPLANT
EXTRACTOR VACUUM M CUP 4 TUBE (SUCTIONS) IMPLANT
EXTRACTOR VACUUM M CUP 4' TUBE (SUCTIONS)
GAUZE SPONGE 4X4 12PLY STRL LF (GAUZE/BANDAGES/DRESSINGS) ×3 IMPLANT
GLOVE BIOGEL PI IND STRL 7.0 (GLOVE) ×2 IMPLANT
GLOVE BIOGEL PI IND STRL 7.5 (GLOVE) ×2 IMPLANT
GLOVE BIOGEL PI INDICATOR 7.0 (GLOVE) ×4
GLOVE BIOGEL PI INDICATOR 7.5 (GLOVE) ×4
GLOVE ECLIPSE 7.5 STRL STRAW (GLOVE) ×3 IMPLANT
GOWN STRL REUS W/TWL LRG LVL3 (GOWN DISPOSABLE) ×9 IMPLANT
KIT ABG SYR 3ML LUER SLIP (SYRINGE) IMPLANT
NEEDLE HYPO 25X5/8 SAFETYGLIDE (NEEDLE) IMPLANT
NS IRRIG 1000ML POUR BTL (IV SOLUTION) ×3 IMPLANT
PACK C SECTION WH (CUSTOM PROCEDURE TRAY) ×3 IMPLANT
PAD ABD 7.5X8 STRL (GAUZE/BANDAGES/DRESSINGS) ×3 IMPLANT
PAD OB MATERNITY 4.3X12.25 (PERSONAL CARE ITEMS) ×3 IMPLANT
PENCIL SMOKE EVAC W/HOLSTER (ELECTROSURGICAL) ×3 IMPLANT
RTRCTR C-SECT PINK 25CM LRG (MISCELLANEOUS) ×3 IMPLANT
STRIP CLOSURE SKIN 1/2X4 (GAUZE/BANDAGES/DRESSINGS) ×2 IMPLANT
SUT VIC AB 0 CTX 36 (SUTURE) ×9
SUT VIC AB 0 CTX36XBRD ANBCTRL (SUTURE) ×3 IMPLANT
SUT VIC AB 2-0 CT1 27 (SUTURE) ×3
SUT VIC AB 2-0 CT1 TAPERPNT 27 (SUTURE) ×1 IMPLANT
SUT VIC AB 4-0 KS 27 (SUTURE) ×3 IMPLANT
TOWEL OR 17X24 6PK STRL BLUE (TOWEL DISPOSABLE) ×3 IMPLANT
TRAY FOLEY W/BAG SLVR 14FR LF (SET/KITS/TRAYS/PACK) ×3 IMPLANT
WATER STERILE IRR 1000ML POUR (IV SOLUTION) ×3 IMPLANT

## 2019-03-12 NOTE — Transfer of Care (Signed)
Immediate Anesthesia Transfer of Care Note  Patient: Molly Burton  Procedure(s) Performed: CESAREAN SECTION (N/A Abdomen)  Patient Location: PACU  Anesthesia Type:Epidural  Level of Consciousness: awake, alert  and oriented  Airway & Oxygen Therapy: Patient Spontanous Breathing  Post-op Assessment: Report given to RN and Post -op Vital signs reviewed and stable  Post vital signs: Reviewed and stable  Last Vitals:  Vitals Value Taken Time  BP 127/74 03/12/2019  4:51 PM  Temp    Pulse 90 03/12/2019  4:57 PM  Resp 12 03/12/2019  4:57 PM  SpO2 98 % 03/12/2019  4:57 PM  Vitals shown include unvalidated device data.  Last Pain:  Vitals:   03/12/19 1655  TempSrc:   PainSc: Asleep      Patients Stated Pain Goal: 8 (03/11/19 1940)  Complications: No apparent anesthesia complications

## 2019-03-12 NOTE — Progress Notes (Signed)
Patient ID: Molly Burton, female   DOB: [birth year], 23 y.o.   MRN: [id number]  Patient having recurrent decelerations with each contraction when pitocin is started. Has had prolonged labor course. Discussed fetal intolerance of labor. Patient amenable to c/s. Cervix unchanged with 2cm of cervix anteriorly.  The risks of cesarean section discussed with the patient included but were not limited to: bleeding which may require transfusion or reoperation; infection which may require antibiotics; injury to bowel, bladder, ureters or other surrounding organs; injury to the fetus; need for additional procedures including hysterectomy in the event of a life-threatening hemorrhage; placental abnormalities wth subsequent pregnancies, incisional problems, thromboembolic phenomenon and other postoperative/anesthesia complications. The patient concurred with the proposed plan, giving informed written consent for the procedure.   Patient has been NPO since last night. She will remain NPO for procedure. Anesthesia and OR aware.  Preoperative prophylactic Ancef and azithromycin ordered on call to the OR.  To OR when ready.  Levie Heritage, DO 03/12/2019 3:30 PM

## 2019-03-12 NOTE — Op Note (Signed)
Cesarean Section Operative Report  PATIENT: Molly Burton  PROCEDURE DATE: 03/12/2019  PREOPERATIVE DIAGNOSES: Intrauterine pregnancy at 2175w1d weeks gestation; fetal intolerance of labor  POSTOPERATIVE DIAGNOSES: The same  PROCEDURE: Primary Low Transverse Cesarean Section  SURGEON:   Surgeon(s) and Role:    * Levie HeritageStinson, Jacob J, DO - Primary    * Arvilla MarketWallace, Catherine Lauren, DO -  OB Fellow   INDICATIONS: Molly Burton is a 23 y.o. G1P0 at 2175w1d here for cesarean section secondary to the indications listed under preoperative diagnoses; please see preoperative note for further details.  The risks of cesarean section were discussed with the patient including but were not limited to: bleeding which may require transfusion or reoperation; infection which may require antibiotics; injury to bowel, bladder, ureters or other surrounding organs; injury to the fetus; need for additional procedures including hysterectomy in the event of a life-threatening hemorrhage; placental abnormalities wth subsequent pregnancies, incisional problems, thromboembolic phenomenon and other postoperative/anesthesia complications.   The patient concurred with the proposed plan, giving informed written consent for the procedure.    FINDINGS:  Viable female infant in cephalic presentation, OP.  Apgars 9 and 9.  Weight: 3820g. Clear amniotic fluid.  Intact placenta, three vessel cord.  Normal uterus, fallopian tubes and ovaries bilaterally.  ANESTHESIA: Epidural INTRAVENOUS FLUIDS: 2400 mL  ESTIMATED BLOOD LOSS: 259 mL URINE OUTPUT:  210 ml SPECIMENS: Placenta sent to L&D COMPLICATIONS: None immediate  PROCEDURE IN DETAIL:  The patient preoperatively received intravenous antibiotics and had sequential compression devices applied to her lower extremities.  She was then taken to the operating room where the epidural anesthesia was dosed up to surgical level and was found to be adequate. She was then placed in a dorsal  supine position with a leftward tilt, and prepped and draped in a sterile manner.  A foley catheter was placed into her bladder and attached to constant gravity.    After an adequate timeout was performed, a Pfannenstiel skin incision was made with scalpel and carried through to the underlying layer of fascia. The fascia was incised in the midline, and this incision was extended bilaterally using the Mayo scissors.  Kocher clamps were applied to the superior aspect of the fascial incision and the underlying rectus muscles were dissected off bluntly.  A similar process was carried out on the inferior aspect of the fascial incision. The rectus muscles were separated in the midline bluntly and the peritoneum was entered bluntly. Attention was turned to the lower uterine segment where a low transverse hysterotomy was made with a scalpel and extended bilaterally bluntly.  The infant was successfully delivered, the cord was clamped and cut after one minute, and the infant was handed over to the awaiting neonatology team. Uterine massage was then administered, and the placenta delivered intact with a three-vessel cord. The uterus was then cleared of clots and debris.  The hysterotomy was closed with 0 Vicryl in a running locked fashion, and an imbricating layer was also placed with 0 Vicryl.  Figure-of-eight 0 Vicryl serosal stitches were placed to help with hemostasis.  The pelvis was cleared of all clot and debris. Hemostasis was confirmed on all surfaces.  The peritoneum was closed with a 0 Vicryl running stitch. The fascia was then closed using 0 Vicryl in a running fashion.  The subcutaneous layer was irrigated. The skin was closed with a 4-0 Vicryl subcuticular stitch.   The patient tolerated the procedure well. Sponge, lap, instrument and needle counts were correct  x 3.  She was taken to the recovery room in stable condition.   An experienced assistant was required given the standard of surgical care given the  complexity of the case.  This assistant was needed for exposure, dissection, suctioning, retraction, instrument exchange, assisting with delivery with administration of fundal pressure, and for overall help during the procedure.   Maternal Disposition: PACU - hemodynamically stable.   Infant Disposition: stable   Marcy Siren, D.O. OB Fellow  03/12/2019, 4:45 PM

## 2019-03-12 NOTE — Anesthesia Preprocedure Evaluation (Addendum)

## 2019-03-12 NOTE — Progress Notes (Signed)
LABOR PROGRESS NOTE  Molly Burton is a 23 y.o. G1P0 at [redacted]w[redacted]d  admitted for postdates IOL.   Subjective: Patient excited about continued cervical change. Asked by RN to place IUPC due to difficulty tracing contractions on toco.   Objective: BP 127/74   Pulse 85   Temp 98.2 F (36.8 C)   Resp 16   Ht 5\' 2"  (1.575 m)   Wt 85.7 kg   LMP  (LMP Unknown)   BMI 34.57 kg/m  or  Vitals:   03/12/19 1131 03/12/19 1200 03/12/19 1232 03/12/19 1301  BP: (!) 143/85 (!) 143/85 138/71 127/74  Pulse: 89 82 82 85  Resp:      Temp:    98.2 F (36.8 C)  TempSrc:      Weight:      Height:        Dilation: 9 Effacement (%): 80 Cervical Position: Middle Station: -1 Presentation: Vertex Exam by:: m wilkins rnc FHT: baseline rate 140, moderate varibility, +acel, late and variable decel Toco: q2-4 min   Labs: Lab Results  Component Value Date   WBC 11.1 (H) 03/11/2019   HGB 12.8 03/11/2019   HCT 38.9 03/11/2019   MCV 94.2 03/11/2019   PLT 180 03/11/2019    Patient Active Problem List   Diagnosis Date Noted  . Post-dates pregnancy 03/11/2019  . Supervision of normal first pregnancy 08/01/2018  . Arthralgia   . Hyperthyroidism 02/08/2018    Assessment / Plan: 23 y.o. G1P0 at [redacted]w[redacted]d here for IOL for postdates. BPs noted to be elevated. Will obtain PIH labs.    Labor: S/p cytotec and FB. Pitocin at 4 mu/min. IUPC placed to better titrate.  Fetal Wellbeing:  Cat II, FSE in place. Overall, strip reassuring with continued moderate variability and good return to baseline.  Pain Control:  Epidural in place  Anticipated MOD:  NSVD   Marcy Siren, D.O. OB Fellow  03/12/2019, 1:23 PM

## 2019-03-12 NOTE — Progress Notes (Signed)
OB/GYN Faculty Practice: Labor Progress Note  Subjective: Walking halls.   Objective: BP (!) 134/93   Pulse 75   Temp 98 F (36.7 C) (Axillary)   Resp 16   Ht 5\' 2"  (1.575 m)   Wt 85.7 kg   LMP  (LMP Unknown)   BMI 34.57 kg/m  Gen: walking, mildly uncomfortable appearing Dilation: 1.5 Effacement (%): 70 Cervical Position: Posterior Station: -2 Presentation: Vertex Exam by:: Dr. Earlene Plater  Assessment and Plan: 23 y.o. G1P0 [redacted]w[redacted]d here for post-dates induction of labor.   Labor: FB still in place. Received cytotec overnight. Okay to have breakfast.  -- pain control: open to options, thinks wants epidural -- PPH Risk: low  Fetal Well-Being: EFW 7-8lbs by Leopolds'. Cephalic by prior checks, sutures.  -- Category I - continuous fetal monitoring  -- GBS negative    Maia Handa S. Earlene Plater, DO OB/GYN Fellow, Faculty Practice  7:48 AM

## 2019-03-12 NOTE — Anesthesia Procedure Notes (Signed)
Epidural Patient location during procedure: OB Start time: 03/12/2019 8:50 AM End time: 03/12/2019 9:01 AM  Staffing Anesthesiologist: Lucretia Kern, MD Performed: anesthesiologist   Preanesthetic Checklist Completed: patient identified, pre-op evaluation, timeout performed, IV checked, risks and benefits discussed and monitors and equipment checked  Epidural Patient position: sitting Prep: DuraPrep Patient monitoring: heart rate, continuous pulse ox and blood pressure Approach: midline Location: L3-L4 Injection technique: LOR air  Needle:  Needle type: Tuohy  Needle gauge: 17 G Needle length: 9 cm Needle insertion depth: 6 cm Catheter type: closed end flexible Catheter size: 19 Gauge Catheter at skin depth: 11 cm Test dose: negative and 2% lidocaine with Epi 1:200 K  Assessment Events: blood not aspirated, injection not painful, no injection resistance, negative IV test and no paresthesia  Additional Notes Reason for block:procedure for pain

## 2019-03-12 NOTE — Lactation Note (Signed)
This note was copied from a baby's chart. Lactation Consultation Note  Patient Name: Molly Burton [id number] Date: 03/12/2019 Reason for consult: Initial assessment;Primapara;1st time breastfeeding;Maternal endocrine disorder;Term Type of Endocrine Disorder?: Thyroid(hypothyroidism)  4 hours old FT female who is being exclusively BF by his mother, she's a P1. Per mom, she didn't receive BF assistance from L&D team, she didn't know she was supposed to BF during the two hours after birth; mom had a c-section. She has a Hx of hypothyroidism, but reported (+) breast changes during the pregnancy. Mom also said she had pieced nipples, when LC assisted with hand expression, no colostrum was observed on right breast but small droplets were observed on left nipple. When [facility] kept doing more hand expression on the left, a small "tinted" drop was noted, probably due to piercings. Noticed that mom has flat nipples and her tissue is hard to compress, set her up with shells, instructions, cleaning and storage were reviewed. Mom brought her personal Freestyle DEBP to the hospital. LC showed mom and grandma how to finger feed baby, they both understand the importance of offering those drops especially when he has difficulty latching on.  Offered assistance with latch and mom agreed to have baby STS. LC took baby STS to mom's right breast first in cross cradle position but he wasn't able to latch, baby was spitty and a little gaggy, he kept burping. Then tried the football hold; same thing, mom voiced that this position is a little better but baby was just suckling on the nipple; mom's tissue is non-compressible. An attempt was documented in Flowsheets. Explain the mom how beneficial would be starting pumping early while while at the hospital; offered to set up a DEBP but she prefers to use her own. Explained to mom that [facility] staff doesn't put together patient's personal pumps because we're not familiar with those,  she said she'll go through the instructions and start pumping tomorrow. Reviewed cluster feeding, feeding cues and normal newborn behavior.  Feeding plan:  1. Encouraged mom to feed baby STS 8-12 times/24 hours or sooner if feeding cues are present 2. Hand expression and spoon/finger feeding was also encouraged 3. Mom will start double pumping tomorrow every 3 hours with her personal Medela Freestyle  BF brochure, BF resources and feeding diary were reviewed. Mom requested to be seen by lactation again later tonight. She and GOB reported all questions and concerns were answered, they're both aware of LC OP services and will call PRN.  Maternal Data Formula Feeding for Exclusion: No Has patient been taught Hand Expression?: Yes Does the patient have breastfeeding experience prior to this delivery?: No  Feeding Feeding Type: Breast Fed  LATCH Score Latch: Too sleepy or reluctant, no latch achieved, no sucking elicited.(baby still very spitty and gaggy)  Audible Swallowing: None  Type of Nipple: Flat  Comfort (Breast/Nipple): Soft / non-tender  Hold (Positioning): Assistance needed to correctly position infant at breast and maintain latch.  LATCH Score: 4  Interventions Interventions: Breast feeding basics reviewed;Assisted with latch;Skin to skin;Breast massage;Hand express;Breast compression;Adjust position;DEBP;Support pillows;Position options  Lactation Tools Discussed/Used Tools: Shells Shell Type: Inverted WIC Program: Yes   Consult Status Consult Status: Follow-up Date: 03/13/19 Follow-up type: In-patient    Nika Yazzie Venetia Constable 03/12/2019, 8:49 PM

## 2019-03-12 NOTE — Progress Notes (Signed)
Interval Progress Note  Patient rechecked an 1-2/70/-2 with BBOW. FB placed without difficulty, patient tolerated procedure well. Will not repeat cytotec at this point because painfully contracting about every 1-2 minutes. Category I strip.   Cristal Deer. Earlene Plater, DO OB/GYN Fellow

## 2019-03-12 NOTE — Progress Notes (Signed)
LABOR PROGRESS NOTE  Molly Burton is a 23 y.o. G1P0 at [redacted]w[redacted]d  admitted for postdates IOL.   Subjective: Comfortable with epidural. Support at bedside. No questions or concerns.   Objective: BP (!) 141/87   Pulse 78   Temp 98 F (36.7 C) (Axillary)   Resp 16   Ht 5\' 2"  (1.575 m)   Wt 85.7 kg   LMP  (LMP Unknown)   BMI 34.57 kg/m  or  Vitals:   03/12/19 0905 03/12/19 0910 03/12/19 0915 03/12/19 0920  BP: 129/81 131/85 133/90 (!) 141/87  Pulse: 74 85 70 78  Resp:      Temp:      TempSrc:      Weight:      Height:        Dilation: 5.5 Effacement (%): 80 Cervical Position: Posterior Station: -1 Presentation: Vertex Exam by:: Dr. Earlene Plater FHT: baseline rate 125, moderate varibility, +acel, no decel Toco: q2-5 min   Labs: Lab Results  Component Value Date   WBC 11.1 (H) 03/11/2019   HGB 12.8 03/11/2019   HCT 38.9 03/11/2019   MCV 94.2 03/11/2019   PLT 180 03/11/2019    Patient Active Problem List   Diagnosis Date Noted  . Post-dates pregnancy 03/11/2019  . Supervision of normal first pregnancy 08/01/2018  . Arthralgia   . Hyperthyroidism 02/08/2018    Assessment / Plan: 23 y.o. G1P0 at [redacted]w[redacted]d here for IOL for postdates.   Labor: S/p cytotec and FB. Will start Pitocin. AROM perfromed at 0930 with clear fluid.  Fetal Wellbeing:  Cat I  Pain Control:  Epidural in place  Anticipated MOD:  NSVD   Marcy Siren, D.O. OB Fellow  03/12/2019, 9:34 AM

## 2019-03-12 NOTE — Progress Notes (Signed)
OB/GYN Faculty Practice: Labor Progress Note  Subjective: Doing well, feeling much more uncomfortable in last hour or so. Feels like contractions are every 1-2 minutes. Trying to move a little bit more.   Objective: BP 129/73 (BP Location: Left Arm)   Pulse 62   Temp 98.2 F (36.8 C) (Oral)   Resp 16   Ht 5\' 2"  (1.575 m)   Wt 85.7 kg   LMP  (LMP Unknown)   BMI 34.57 kg/m  Gen: uncomfortable appearing Dilation: Fingertip Effacement (%): 50 Cervical Position: Posterior Station: -2 Presentation: Vertex Exam by:: Mary [place] Johnson, RN   Assessment and Plan: 23 y.o. G1P0 [redacted]w[redacted]d here for post-dates induction of labor.   Labor: Induction started this morning with cytotec. Received 3 doses of buccal cytotec then most recently vaginal cytotec. Will plan to proceed with FB at next check.  -- pain control: open to options, thinks wants epidural -- PPH Risk: low  Fetal Well-Being: EFW 7-8lbs by Leopolds'. Cephalic by prior checks, sutures.  -- Category I - continuous fetal monitoring  -- GBS negative    Delany Steury S. Earlene Plater, DO OB/GYN Fellow, Faculty Practice  12:19 AM

## 2019-03-12 NOTE — Discharge Summary (Signed)
OB Discharge Summary    Patient Name: Molly CaterDestiny K Aamodt DOB: 08-27-1996 MRN: [id number]  Date of admission: 03/11/2019 Delivering MD: Levie HeritageSTINSON, JACOB J   Date of discharge: 03/14/2019  Admitting diagnosis: pregnancy Intrauterine pregnancy: 6764w1d     Secondary diagnosis:  Active Problems:   Hyperthyroidism   Post-dates pregnancy   Cesarean delivery delivered  Discharge diagnosis: Term Pregnancy Delivered                                Postpartum procedures:none Augmentation: AROM, Pitocin, Cytotec and Foley Balloon  Complications: None  Hospital course:  Induction of Labor With Cesarean Section  23 y.o. yo G1P0 at 4464w1d was admitted to the hospital 03/11/2019 for induction of labor. Patient had a labor course significant for recurrent deep, prolonged decelerations with contractions. The patient went for cesarean section due to Non-Reassuring FHR, and delivered a Viable infant,03/12/2019  Membrane Rupture Time/Date: 9:30 AM ,03/12/2019   Details of operation can be found in separate operative Note.  Patient had an uncomplicated postpartum course. She is ambulating, tolerating a regular diet, passing flatus, and urinating well.  Patient is discharged home in stable condition on 03/14/19.                                   Physical exam  Vitals:   03/13/19 1002 03/13/19 1420 03/13/19 2147 03/14/19 0500  BP: 123/80 131/65 131/89 124/90  Pulse: 89 92 82 84  Resp: 15 19 16 16   Temp: 99.1 F (37.3 C) 98.4 F (36.9 C) 98.1 F (36.7 C) 97.9 F (36.6 C)  TempSrc: Oral Oral Oral Oral  SpO2: 98% 98% 100% 99%  Weight:      Height:       General: alert, well-appearing, NAD Lochia: appropriate Uterine Fundus: firm Incision: Dressing is clean, dry, and intact - small amount of dried blood noted DVT Evaluation: No significant calf/ankle edema  Labs: Lab Results  Component Value Date   WBC 18.4 (H) 03/13/2019   HGB 10.0 (L) 03/13/2019   HCT 30.4 (L) 03/13/2019   MCV 93.8 03/13/2019   PLT  167 03/13/2019   CMP Latest Ref Rng & Units 03/13/2019  Glucose 70 - 99 mg/dL 96  BUN 6 - 20 mg/dL 10  Creatinine 7.820.44 - 9.561.00 mg/dL 2.130.91  Sodium 086135 - 578145 mmol/L 137  Potassium 3.5 - 5.1 mmol/L 4.0  Chloride 98 - 111 mmol/L 106  CO2 22 - 32 mmol/L 21(L)  Calcium 8.9 - 10.3 mg/dL 4.6(N8.8(L)  Total Protein 6.5 - 8.1 g/dL -  Total Bilirubin 0.3 - 1.2 mg/dL -  Alkaline Phos 38 - 629126 U/L -  AST 15 - 41 U/L -  ALT 0 - 44 U/L -    Discharge instruction: per After Visit Summary and "Baby and Me Booklet".  After visit meds:  Allergies as of 03/14/2019   No Known Allergies     Medication List    TAKE these medications   CitraNatal Assure 35-1 & 300 MG tablet One tablet and one capsule daily What changed:    how much to take  how to take this  when to take this  additional instructions   Docusate Sodium 100 MG capsule Take 1 tablet (100 mg total) by mouth 2 (two) times daily as needed for constipation.   ibuprofen 800 MG tablet Commonly known as:  ADVIL,MOTRIN Take 1 tablet (800 mg total) by mouth 3 (three) times daily.   oxyCODONE 5 MG immediate release tablet Commonly known as:  Oxy IR/ROXICODONE Take 1 tablet (5 mg total) by mouth every 6 (six) hours as needed for up to 5 days for severe pain.            Discharge Care Instructions  (From admission, onward)         Start     Ordered   03/14/19 0000  Discharge wound care:    Comments:  Keep dressing in place for about a week. Okay to shower like normal.   03/14/19 0927         Diet: routine diet Activity: Advance as tolerated. Pelvic rest for 6 weeks.   Outpatient follow up:4 weeks Follow up Appt: Future Appointments  Date Time Provider Department Center  03/19/2019 10:15 AM Lazaro Arms, MD [facility]  04/15/2019 10:30 AM Cheral Marker, CNM [facility]   Please schedule this patient for Postpartum visit in: 4 weeks with the following provider: Any provider For C/S patients schedule nurse  incision check in weeks 2 weeks: yes Low risk pregnancy complicated by: none  Delivery mode:  CS Anticipated Birth Control:  Condoms PP Procedures needed: Incision check  Schedule Integrated BH visit: no  Postpartum contraception: Condoms  Newborn Data: Live born female  Birth Weight:  3820g APGAR: 9, 9   Newborn Delivery   Birth date/time:  03/12/2019 16:01:00 Delivery type:  C-Section, Low Transverse Trial of labor:  Yes C-section categorization:  Primary    Baby Feeding: Breast Disposition:home with mother  03/14/2019 Tamera Stands, DO

## 2019-03-13 ENCOUNTER — Encounter (HOSPITAL_COMMUNITY): Payer: Self-pay | Admitting: Family Medicine

## 2019-03-13 LAB — CBC
HCT: 30.4 % — ABNORMAL LOW (ref 36.0–46.0)
Hemoglobin: 10 g/dL — ABNORMAL LOW (ref 12.0–15.0)
MCH: 30.9 pg (ref 26.0–34.0)
MCHC: 32.9 g/dL (ref 30.0–36.0)
MCV: 93.8 fL (ref 80.0–100.0)
Platelets: 167 10*3/uL (ref 150–400)
RBC: 3.24 MIL/uL — ABNORMAL LOW (ref 3.87–5.11)
RDW: 13 % (ref 11.5–15.5)
WBC: 18.4 10*3/uL — ABNORMAL HIGH (ref 4.0–10.5)
nRBC: 0 % (ref 0.0–0.2)

## 2019-03-13 LAB — BASIC METABOLIC PANEL
Anion gap: 10 (ref 5–15)
BUN: 10 mg/dL (ref 6–20)
CO2: 21 mmol/L — ABNORMAL LOW (ref 22–32)
Calcium: 8.8 mg/dL — ABNORMAL LOW (ref 8.9–10.3)
Chloride: 106 mmol/L (ref 98–111)
Creatinine, Ser: 0.91 mg/dL (ref 0.44–1.00)
GFR calc Af Amer: >60 mL/min (ref >60)
GFR calc non Af Amer: >60 mL/min (ref >60)
Glucose, Bld: 96 mg/dL (ref 70–99)
Potassium: 4 mmol/L (ref 3.5–5.1)
Sodium: 137 mmol/L (ref 135–145)

## 2019-03-13 MED ORDER — IBUPROFEN 600 MG PO TABS
600.0000 mg | ORAL_TABLET | Freq: Four times a day (QID) | ORAL | Status: DC
  Administered 2019-03-13 – 2019-03-14 (×5): 600 mg via ORAL
  Filled 2019-03-13 (×5): qty 1

## 2019-03-13 NOTE — Anesthesia Postprocedure Evaluation (Signed)
Anesthesia Post Note  Patient: Molly Burton  Procedure(s) Performed: CESAREAN SECTION (N/A Abdomen)     Patient location during evaluation: Mother Baby Anesthesia Type: Epidural Level of consciousness: awake and alert Pain management: pain level controlled Vital Signs Assessment: post-procedure vital signs reviewed and stable Respiratory status: spontaneous breathing, nonlabored ventilation and respiratory function stable Cardiovascular status: stable Postop Assessment: no headache, no backache, epidural receding, no apparent nausea or vomiting, patient able to bend at knees, able to ambulate and adequate PO intake Anesthetic complications: no    Last Vitals:  Vitals:   03/13/19 0235 03/13/19 0620  BP: 132/74 120/82  Pulse: (!) 109 (!) 114  Resp: 18 16  Temp: 37.4 C 37.5 C  SpO2: 98% 97%    Last Pain:  Vitals:   03/13/19 0620  TempSrc: Oral  PainSc: 0-No pain   Pain Goal: Patients Stated Pain Goal: 8 (03/11/19 1940)                 Laban Emperor

## 2019-03-13 NOTE — Lactation Note (Signed)
This note was copied from a baby's chart. Lactation Consultation Note Baby 28 hrs old. Baby hasn't latched to breast. Mom has been syring and finger feeding baby colostrum.  Mom cont. To have rusty pipe colostrum. Noted less dark this evening. Mom is using DEBP as encouraged.  Mom has edema to breast. Nipples flat not compressible for latching.  Breast massage and hand expression collecting colostrum to soften tissue. Reverse pressure helpful. Fitted mom w/#24 NS. Instructed mom that tissue needs to be softened or NS will not stay on. Demonstrated several times application of NS. Latched baby in football position. Encouraged "C" hold to firm breast for latching. Mom frequently falls asleep while feeding baby. Mom having difficulty staying awake. Side rail up, MGM at bedside. Baby BF well for 10 min, then has wanted to stay on breast holding NS in his mouth, w/occassional non-nutritive suckle.  Encouraged mom to pump and massage breast, then hand express. Supplement baby after BF. Demonstrated to mom inserting colostrum into NS w/curve tip syring.  Mom stating she is hoping to go home tomorrow. LC explained that baby isn't feeding well will need to feed well and mom be comfortable w/feeding. Mom stated she wasn't comfortable at this time.  Mom didn't wear shells today as encouraged to do so. Mom stated she will try wearing them tomorrow. LC stressed benefits of wearing them. Reported to RN.   Patient Name: Boy Bryce Fever [id number] Date: 03/13/2019 Reason for consult: Follow-up assessment;Difficult latch;1st time breastfeeding   Maternal Data    Feeding Feeding Type: Breast Fed  LATCH Score Latch: Repeated attempts needed to sustain latch, nipple held in mouth throughout feeding, stimulation needed to elicit sucking reflex.  Audible Swallowing: A few with stimulation  Type of Nipple: Flat  Comfort (Breast/Nipple): Filling, red/small blisters or bruises, mild/mod  discomfort(edema/breast not soft)  Hold (Positioning): Full assist, staff holds infant at breast  LATCH Score: 4  Interventions Interventions: Breast feeding basics reviewed;Breast compression;Assisted with latch;Adjust position;Support pillows;DEBP;Breast massage;Position options;Hand express;Expressed milk;Pre-pump if needed;Reverse pressure(mom not wearing shells)  Lactation Tools Discussed/Used     Consult Status Consult Status: Follow-up Date: 03/14/19 Follow-up type: In-patient    Charyl Dancer 03/13/2019, 8:48 PM

## 2019-03-13 NOTE — Progress Notes (Signed)
Post Partum Day 1 Subjective: Doing well, POD#1 C/S for fetal intolerance. Still having some soreness, tired. Able to get up and use the bathroom this morning.   Objective: Blood pressure 120/82, pulse (!) 114, temperature 99.5 F (37.5 C), temperature source Oral, resp. rate 16, height 5\' 2"  (1.575 m), weight 85.7 kg, SpO2 97 %, unknown if currently breastfeeding.  Physical Exam:  General: well-appearing, NAD Lochia: appropriate Uterine Fundus: firm Incision: pressure dressing still in place, no leakage noted on dressing DVT Evaluation: SCDs in place, no significant LE edema noted  Recent Labs    03/11/19 0728 03/12/19 1319  HGB 12.8 12.6  HCT 38.9 37.7    Assessment/Plan: Doing well post-operatively. Will follow-up am HgB and Cr  Plan to discharge home tomorrow    LOS: 2 days   Tamera Stands, DO 03/13/2019, 7:12 AM

## 2019-03-13 NOTE — Lactation Note (Signed)
This note was copied from a baby's chart. Lactation Consultation Note Baby 10 hrs old, hasn't latched to breast. Baby has burped and gagged/heaving trying to spit up, unable to at this time. Baby starting to cue. Moms breast w/edema. Heavy full feeling. nipples flat, thick w/compression. Baby will not be able to latch until tissue has softened. Mom has shells. Fresh c/section, IV infusing. Mom very sleepy. LC demonstrated reverse pressure to soften areola and nipple tissue. W/much reverse/massage softened tissue enough for colostrum to express allowing tissue to soften more. Hand expressed 10 ml "rusty pipe" colostrum from each breast.  Noted softening of breast. Mom fell asleep during expression. MGM holding baby. LC spoon fed baby 5 ml colostrum.  W/gloved finger assessed suck. Baby has poor suck swallow coordination at this time.  Baby will suck a few times, stop, clamp. Stimulate to relax tongue and initiate suck. Baby would suck a few times then stop. Spoon fed colostrum. Baby open mouth, didn't know how to "lap" from the spoon. LC touch tongue w/spoon to give a drop of rusty pipe colostrum.  W/much work, Development worker, international aid took 5 ml then started hiccupping. Mom then woke up. Discussed breast w/mom. Encouraged to use DEBP. Mom agreed. Mom shown how to use DEBP & how to disassemble, clean, & reassemble parts. Mom knows to pump q3h for 15-20 min. If breast feeling full and baby isn't feeding well or relieving breast. Mom can pump for supplementing baby until starts feeding well from breast. Mom has 25 ml of colostrum at bedside, 42ml from pumping. Milk storage discussed. Mom has personal Medela pump. Discussed using hospital grade DEBP while in hospital. Mom agreed. Asked for personal pump instructions before d/c home. LC discussed newborn feeding habits, behavior, STS, I&O, supply and demand. Mom has shells. Strongly encouraged mom to wear in am. Until mom's nipples are everted more and tissue softened baby  will not be able to latch to breast w/o an assisted device.  At this time baby is not interested in latching, NS unable to try at this time. Mom will need #24 NS. LC not sure at this time if mom is candidate for NS. Will assess next feeding. Mom will call for [facility] assistance for next feeding. Spoke w/RN of consult.    Patient Name: Molly Burton [id number] Date: 03/13/2019 Reason for consult: Follow-up assessment;Term Type of Endocrine Disorder?: Thyroid   Maternal Data    Feeding Feeding Type: Breast Milk  LATCH Score Latch: Too sleepy or reluctant, no latch achieved, no sucking elicited.     Type of Nipple: Flat  Comfort (Breast/Nipple): Filling, red/small blisters or bruises, mild/mod discomfort(edema/non-tender)        Interventions Interventions: Breast feeding basics reviewed;Reverse pressure;Expressed milk;DEBP;Breast compression;Breast massage;Shells;Hand express  Lactation Tools Discussed/Used Tools: Shells;Pump Shell Type: Inverted Breast pump type: Double-Electric Breast Pump Pump Review: Setup, frequency, and cleaning;Milk Storage Initiated by:: Peri Jefferson RN IBCLC Date initiated:: 03/13/19   Consult Status Consult Status: Follow-up Date: 03/13/19 Follow-up type: In-patient    Hikari Tripp, Diamond Nickel 03/13/2019, 3:03 AM

## 2019-03-14 ENCOUNTER — Encounter (HOSPITAL_COMMUNITY): Payer: Self-pay | Admitting: *Deleted

## 2019-03-14 MED ORDER — OXYCODONE HCL 5 MG PO TABS: 5.0000 mg | ORAL_TABLET | Freq: Four times a day (QID) | ORAL | 0 refills | Status: AC | PRN

## 2019-03-14 MED ORDER — IBUPROFEN 800 MG PO TABS: 800.0000 mg | ORAL_TABLET | Freq: Three times a day (TID) | ORAL | 0 refills | Status: DC

## 2019-03-14 MED ORDER — DOCUSATE SODIUM 100 MG PO TABS: 100.0000 mg | ORAL_TABLET | Freq: Two times a day (BID) | ORAL | 0 refills | Status: DC | PRN

## 2019-03-14 NOTE — Lactation Note (Signed)
This note was copied from a baby's chart. Lactation Consultation Note Baby is 33 hrs. Mom unable to latch baby w/NS. Mom isn't applying NS properly. Wet NS#24, demonstrated 3 times, mom applied several times w/o turning NS inwards to cause suction. Mom demonstrated 2 times correctly. Latched baby. Baby fussy. Baby suckled in non-nutritive feeding. Stimulated baby trying to get to suckle properly and feed. Baby went to sleep. Removed baby. Baby crying. Attempted to latch to nipple. Unable to d/t flat non-compressible tissue. Discussed w/mom that baby needs to feed. Gave baby 2 ml colostrum w/curve tip syring and gloved finger. Baby has uncoordinated suckle, chews, suck a couple of times, then clamp.  Discussed w/mom trying to train baby to have nutritive suck. Mom gave permission to give bottle w/formula.  Baby took 62ml. Burped baby, then baby took 1ml quickly. Gave mom feeding sheet of how much to give at hours of age. Mom states understanding.  Mom is putting on her bra and wearing her shells. Encouraged mom to pump again in 3 hrs. Try to latch baby after removing shells. Try to latch w/NS if will not latch to nipples. Give baby colostrum after pumping, then give formula to equal amount needed if baby doesn't feed on breast.  Patient Name: [name] [id number] Date: 03/14/2019 Reason for consult: Mother's request;Difficult latch   Maternal Data    Feeding Feeding Type: Formula  LATCH Score Latch: Repeated attempts needed to sustain latch, nipple held in mouth throughout feeding, stimulation needed to elicit sucking reflex.  Audible Swallowing: None  Type of Nipple: Flat  Comfort (Breast/Nipple): Filling, red/small blisters or bruises, mild/mod discomfort(edema)  Hold (Positioning): Full assist, staff holds infant at breast  LATCH Score: 3  Interventions Interventions: Breast feeding basics reviewed;Breast compression;Assisted with latch;Adjust position;Skin to skin;Support  pillows;DEBP;Breast massage;Position options;Pre-pump if needed;Reverse pressure;Shells  Lactation Tools Discussed/Used Tools: Shells;Pump(mom is applying shells after feeding) Nipple shield size: 24 Shell Type: Inverted Breast pump type: Double-Electric Breast Pump   Consult Status Consult Status: Follow-up Date: 03/14/19 Follow-up type: In-patient    Molly Burton, Molly Burton 03/14/2019, 1:10 AM

## 2019-03-14 NOTE — Lactation Note (Signed)
This note was copied from a baby's chart. Lactation Consultation Note  Patient Name: Boy Aleen Sousa [id number] Date: 03/14/2019 Reason for consult: Follow-up assessment;1st time breastfeeding;Term;Difficult latch Type of Endocrine Disorder?: Thyroid  Visited with P1 Mom of term baby on day of discharge.  Baby 44 hrs old and at 4% weight loss.   Mom has been double pumping as baby isn't latching.  Mom desires OP lactation follow-up, request sent to Clinic.  Encouraged STS and feeding baby on cue, offering breast first, and then double pumping 15-20 mins at each feeding.  Engorgement prevention and treatment reviewed.  Mom aware of OP lactation support and encouraged to call prn.  Interventions Interventions: Breast feeding basics reviewed;Skin to skin;Breast massage;Hand express;DEBP;Expressed milk;Shells  Lactation Tools Discussed/Used Tools: Shells;Pump Shell Type: Inverted Breast pump type: Double-Electric Breast Pump   Consult Status Consult Status: Complete Date: 03/14/19 Follow-up type: Out-patient    Judee Clara 03/14/2019, 12:38 PM

## 2019-03-18 ENCOUNTER — Telehealth: Payer: Self-pay | Admitting: Women's Health

## 2019-03-18 NOTE — Telephone Encounter (Signed)
Attempted to call patient back heard message that vm is not set up.  

## 2019-03-18 NOTE — Telephone Encounter (Signed)
Please call pt has c section last week and having issue with incision. Has appt on Friday with Dr Despina Hidden.

## 2019-03-19 ENCOUNTER — Encounter: Payer: Medicaid Other | Admitting: Obstetrics & Gynecology

## 2019-03-21 ENCOUNTER — Telehealth: Payer: Self-pay | Admitting: *Deleted

## 2019-03-21 NOTE — Telephone Encounter (Signed)
Patient informed that we are not allowing visitors or children to come to appointments at this time. Patient denies any contact with anyone suspected or confirmed of having COVID-19. Pt denies fever, cough, sob, muscle pain, diarrhea, rash, vomiting, abdominal pain, red eye, weakness, bruising or bleeding, joint pain or severe headache.  

## 2019-03-22 ENCOUNTER — Other Ambulatory Visit: Payer: Self-pay

## 2019-03-22 ENCOUNTER — Encounter: Payer: Self-pay | Admitting: Obstetrics & Gynecology

## 2019-03-22 ENCOUNTER — Ambulatory Visit (INDEPENDENT_AMBULATORY_CARE_PROVIDER_SITE_OTHER): Payer: Medicaid Other | Admitting: Obstetrics & Gynecology

## 2019-03-22 VITALS — BP 162/109 | HR 89 | Ht 62.0 in | Wt 160.4 lb

## 2019-03-22 DIAGNOSIS — O165 Unspecified maternal hypertension, complicating the puerperium: Secondary | ICD-10-CM

## 2019-03-22 DIAGNOSIS — Z98891 History of uterine scar from previous surgery: Secondary | ICD-10-CM

## 2019-03-22 MED ORDER — AMLODIPINE BESYLATE 10 MG PO TABS: 10.0000 mg | ORAL_TABLET | Freq: Every day | ORAL | 30 refills | Status: DC

## 2019-03-22 NOTE — Progress Notes (Signed)
  HPI: Patient returns for routine postoperative follow-up having undergone primary C section 03/12/2019 .  The patient's immediate postoperative recovery has been unremarkable. Since hospital discharge the patient reports some occasional headaches.   Current Outpatient Medications: amLODipine (NORVASC) 10 MG tablet, Take 1 tablet (10 mg total) by mouth daily., Disp: 30 tablet, Rfl: 30 Docusate Sodium 100 MG capsule, Take 1 tablet (100 mg total) by mouth 2 (two) times daily as needed for constipation., Disp: 10 tablet, Rfl: 0 ibuprofen (ADVIL,MOTRIN) 800 MG tablet, Take 1 tablet (800 mg total) by mouth 3 (three) times daily., Disp: 30 tablet, Rfl: 0 Prenat w/o [id number] (CITRANATAL ASSURE) 35-1 & 300 MG tablet, One tablet and one capsule daily (Patient taking differently: Take 2 tablets by mouth See admin instructions. Take 1 tablet and 1 capsule by mouth daily), Disp: 60 tablet, Rfl: 11  No current facility-administered medications for this visit.     Blood pressure (!) 162/109, pulse 89, height 5\' 2"  (1.575 m), weight 160 lb 6.4 oz (72.8 kg), currently breastfeeding.  Physical Exam: Incision clean dry intact well healed No erythema or undue tenderness  Diagnostic Tests:   Pathology:   Impression: S/P Primary C section for repetitive variable decelerations, FITL Postpartum hypertension  Plan: Meds ordered this encounter  Medications  . amLODipine (NORVASC) 10 MG tablet    Sig: Take 1 tablet (10 mg total) by mouth daily.    Dispense:  30 tablet    Refill:  30     Follow up: 1  weeks  BP check only  Lazaro Arms, MD

## 2019-03-27 ENCOUNTER — Ambulatory Visit: Payer: Self-pay

## 2019-03-27 NOTE — Lactation Note (Signed)
This note was copied from a baby's chart. Lactation Consultation Note  Patient Name: Molly Burton [id number] Date: 03/27/2019     Maternal Data    Feeding    LATCH Score                   Interventions    Lactation Tools Discussed/Used     Consult Status      Ed Blalock 03/27/2019, 1:49 PM

## 2019-03-28 ENCOUNTER — Ambulatory Visit: Payer: Self-pay

## 2019-03-28 NOTE — Lactation Note (Signed)
This note was copied from a baby's chart. Lactation Consultation Note  Patient Name: Molly Burton     Burton  Name: Molly Burton MRN: [id number] Date of Birth: 03/12/2020 Gestational Age: Gestational Age: 2174w1d Birth Weight: 134.8 oz Weight today:    9 pounds 5.2 ounces (4230 grams) with clean newborn diaper  212 week old infant presents today with mom and MGM for feeding assessment. Infant has not latched since birth. Mom was pumping and stopped a week ago. Mom would like for infant to start BF again due to constipation.  Discussed with mom that her milk supply is most likely decreased and would recommend that she feed infant and pump to increase her supply. Informed her she will need to continue using formula until her supply has increased. Reviewed supply and demand and importance of emptying the breast regularly  Mom reports [facility] She has not pumped since last week. She reports she was pumping about an ounce before she stopped breast feeding.   Mom wearing breast shells. Reviewed not saving milk and only wearing during the day. Nipples do every with stimulation easily, they are short shaft. Nipple was slightly asymmetrical post feeding.  Infant did best on the right breast and did not need a NS on that breast. Infant less satisfied on the left breast where nipple tends to flatten and needed to use a NS on that breast. Mom shown how to apply and discussed cleaning. Infant latched very easily and mom pleased that he latched.   Showed mom how to pace bottle feed infant as infant eating very quickly on the bottle.   Discussed [place] for mom to speak with OB about at her appt tomorrow, fluids, calories and rest. Reiterated that milk supply will not increase without breast stimulation and breast emptying. Mom given handout. She has history of high BP since delivery, otherwise no other risk factors.    Infant to follow up with Dr. Laural BenesJohnson on 5/6. Infant to follow up with Lactation in 2 weeks. Mom to call with questions/concerns as needed. Mom informed of Virtual Support Groups.      General Information: Mother's reason for visit: Feeding assessment, will not latch to the breast Consult: Initial Lactation consultant: Noralee StainSharon Deval Mroczka RN,IBCLC Breastfeeding experience: has not latched since birth Maternal medical conditions: Thyroid, Pregnancy induced hypertension(Hypo, was ok on repeat, no treatment needed. high BP post delivery) Maternal medications: Pre-natal vitamin, Other(BP meds, [facility]  Breastfeeding History: Frequency of breast feeding: has not latched    Supplementation: Supplement method: bottle(Medela Slow Flow) Brand: Rush Barer(Gerber Soothe) Formula volume: 2-3 ounces Formula frequency: every 3 hours, self awakens         Pump type: Symphony Pump frequency: has not pumped in a week Pump volume: 1 ounce last week  Infant Output Assessment: Voids per 24 hours: 8+ Urine color: Clear yellow Stools per 24 hours: 1-2 Stool color: Green  Breast Assessment: Breast: Soft, Compressible, Asymmetrical(left breast smaller that the right, mom reports happened  with and after pregnancy) Nipple: Erect(left nipple flattens with areolar compression) Pain level: 0 Pain interventions: Bra  Feeding Assessment: Infant oral assessment: Variance Infant oral assessment comment: Infant with thin labial frenulum that inserts in the middle of the gum ridge. Upper lip flanges well. Infant with thin anterior lingual frenulum noted (mom and MGM have them also). Tongue with good extension and lateralization. Infant has some decreased mid tongue elevation.  infant with some snapback when first suckling on gloved finger.  Mom with no nipple pain with feeding. Nipple slightly asymmetrical post feeding. Mom was shown tongue restriction and how that can effect milk supply and  transfer. Website information and local provider information given. Enc mom to educate herself but [facility] feels like infant is doing well right now. Services are limited due to COVID 19 at this time.   Positioning: Forensic psychologist: 2 - Grasps breast easily, tongue down, lips flanged, rhythmical sucking. Audible swallowing: 2 - Spontaneous and intermittent Type of nipple: 2 - Everted at rest and after stimulation Comfort: 2 - Soft/non-tender Hold: 1 - Assistance needed to correctly position infant at breast and maintain latch LATCH score: 9 Latch assessment: Deep Lips flanged: Yes Suck assessment: Displays both   Pre-feed weight: 4230 grams Post feed weight: 4260 grams Amount transferred: 30 ml Amount supplemented: 0  Additional Feeding Assessment: Infant oral assessment: Variance Infant oral assessment comment: see note Positioning: Football(left breast, 5 minutes) Latch: 1 - Repeated attempts neede to sustain latch, nipple held in mouth throughout feeding, stimulation needed to elicit sucking reflex. Audible swallowing: 1 - A few with stimulation Type of nipple: 2 - Everted at rest and after stimulation Comfort: 2 - Soft/non-tender   LATCH score: 6 Latch assessment: Deep Lips flanged: Yes Suck assessment: Displays both Tools: Nipple shield 24 mm Pre-feed weight: 4260 grams Post feed weight: 4264 grams Amount transferred: 4 ml Amount supplemented: 60 ml formula via medela Bottle  Totals: Total amount transferred: 34 ml Total supplement given: 60 ml formula via bottle Total amount pumped post feed: did not pump   Plan: 1. Offer infant the breast as often as mom and infant are able, the more that you breast feed and pump the more milk your body should make. Do lots of skin to skin with infant.  2. Offer both breasts with each feeding, allow infant to empty the first breast before offering the second breast 3. If infant is frustrated when trying to latch to the breast, offer  him a bottle of formula or breast milk first (1/2-1 ounce) and then try to latch 4. Use the #24 Nipple shield on the left breast for feeding as needed to sustain latch, try each day without it to see when he can feed without it.  5. Pump both breasts 6-8 x a day for 15-20 minutes with your Symphony pump to promote and protect milk supply. Pump after breast feeding or anytime infant is not latching to the breast, try to pump about every 3 hours.  6. Offer infant a bottle of pumped breast milk or formula after breastfeeding, feed until infant is satisfied.  7. Infant needs about 79-105 ml (2.5-3.5 ounces) or 630-840 ml (21-28 ounces) in 24 hours. Infant may take more or less depending on how often he feeds. Feed infant until he is satisfied. 8. Feed infant with the bottle using the paced bottle feeding method (video on [email or web address]) 9. Keep up the good work 10. Thank you for allowing me to assist you today 11. Please call with any questions/concerns as needed [phone] 12. Follow up with Lactation in 2 weeks  [employer] Event organiser, [employer]  Silas Flood Petra Sargeant Burton, 10:21 AM

## 2019-03-29 ENCOUNTER — Ambulatory Visit: Payer: Medicaid Other | Admitting: *Deleted

## 2019-03-29 ENCOUNTER — Other Ambulatory Visit: Payer: Self-pay

## 2019-03-29 VITALS — BP 135/96 | HR 91

## 2019-03-29 DIAGNOSIS — Z0130 Encounter for examination of blood pressure without abnormal findings: Secondary | ICD-10-CM

## 2019-03-29 NOTE — Progress Notes (Signed)
Pt in for bp check. Feeling better, no headaches or blurry vision. Per Dr Despina Hidden continue meds and followup for PP visit.

## 2019-04-15 ENCOUNTER — Ambulatory Visit: Payer: Medicaid Other | Admitting: Women's Health

## 2019-04-15 ENCOUNTER — Ambulatory Visit (INDEPENDENT_AMBULATORY_CARE_PROVIDER_SITE_OTHER): Payer: Medicaid Other | Admitting: Obstetrics & Gynecology

## 2019-04-15 ENCOUNTER — Other Ambulatory Visit: Payer: Self-pay

## 2019-04-15 ENCOUNTER — Encounter: Payer: Self-pay | Admitting: Obstetrics & Gynecology

## 2019-04-15 MED ORDER — DESOGESTREL-ETHINYL ESTRADIOL 0.15-0.02/0.01 MG (21/5) PO TABS: 1.0000 | ORAL_TABLET | Freq: Every day | ORAL | 11 refills | Status: DC

## 2019-04-15 NOTE — Progress Notes (Signed)
Subjective:     Molly Burton is a 23 y.o. female who presents for a postpartum visit. She is 5 weeks postpartum following a low cervical transverse Cesarean section. I have fully reviewed the prenatal and intrapartum course. The delivery was at 41 gestational weeks. Outcome: primary cesarean section, low transverse incision. Anesthesia: epidural. Postpartum course has been norma;. Baby's course has been normal. Baby is feeding by breast. Bleeding staining only. Bowel function is normal. Bladder function is normal. Patient is not sexually active. Contraception method is none. Postpartum depression screening: negative.  The following portions of the patient's history were reviewed and updated as appropriate: allergies, current medications, past family history, past medical history, past social history, past surgical history and problem list.  Review of Systems Pertinent items are noted in HPI.   Objective:    BP 123/84 (BP Location: Left Arm, Patient Position: Sitting, Cuff Size: Normal)   Pulse 87   Ht 5\' 2"  (1.575 m)   Wt 158 lb (71.7 kg)   Breastfeeding Yes   BMI 28.90 kg/m   General:  alert, cooperative and no distress   Breasts:    Lungs:   Heart:    Abdomen: soft, non-tender; bowel sounds normal; no masses,  no organomegaly   Vulva:  normal  Vagina: normal vagina  Cervix:  no cervical motion tenderness and no lesions  Corpus: normal size, contour, position, consistency, mobility, non-tender  Adnexa:  normal adnexa  Rectal Exam:         Assessment:     normal postpartum exam. Pap smear not done at today's visit.   Plan:    1. Contraception: considering nexplanon also OCP unsure 2.  Meds ordered this encounter  Medications  . desogestrel-ethinyl estradiol (MIRCETTE) 0.15-0.02/0.01 MG (21/5) tablet    Sig: Take 1 tablet by mouth daily.    Dispense:  1 Package    Refill:  11    3. Follow up in: 6 months or as needed.

## 2019-06-13 ENCOUNTER — Encounter (HOSPITAL_COMMUNITY): Payer: Self-pay

## 2019-06-13 ENCOUNTER — Emergency Department (HOSPITAL_COMMUNITY)
Admission: EM | Admit: 2019-06-13 | Discharge: 2019-06-13 | Disposition: A | Discharge status: Home / Self Care | Payer: Medicaid Other | Attending: Emergency Medicine | Admitting: Emergency Medicine

## 2019-06-13 ENCOUNTER — Other Ambulatory Visit: Payer: Self-pay

## 2019-06-13 DIAGNOSIS — S00451A Superficial foreign body of right ear, initial encounter: Secondary | ICD-10-CM | POA: Diagnosis not present

## 2019-06-13 DIAGNOSIS — M778 Other enthesopathies, not elsewhere classified: Secondary | ICD-10-CM | POA: Diagnosis not present

## 2019-06-13 DIAGNOSIS — M25532 Pain in left wrist: Secondary | ICD-10-CM | POA: Diagnosis not present

## 2019-06-13 DIAGNOSIS — X58XXXA Exposure to other specified factors, initial encounter: Secondary | ICD-10-CM | POA: Diagnosis not present

## 2019-06-13 DIAGNOSIS — I129 Hypertensive chronic kidney disease with stage 1 through stage 4 chronic kidney disease, or unspecified chronic kidney disease: Secondary | ICD-10-CM | POA: Insufficient documentation

## 2019-06-13 DIAGNOSIS — M779 Enthesopathy, unspecified: Secondary | ICD-10-CM | POA: Diagnosis not present

## 2019-06-13 DIAGNOSIS — N189 Chronic kidney disease, unspecified: Secondary | ICD-10-CM | POA: Insufficient documentation

## 2019-06-13 DIAGNOSIS — S00452A Superficial foreign body of left ear, initial encounter: Secondary | ICD-10-CM | POA: Diagnosis not present

## 2019-06-13 DIAGNOSIS — Y998 Other external cause status: Secondary | ICD-10-CM | POA: Diagnosis not present

## 2019-06-13 DIAGNOSIS — Y929 Unspecified place or not applicable: Secondary | ICD-10-CM | POA: Diagnosis not present

## 2019-06-13 DIAGNOSIS — Z79899 Other long term (current) drug therapy: Secondary | ICD-10-CM | POA: Diagnosis not present

## 2019-06-13 DIAGNOSIS — Y9389 Activity, other specified: Secondary | ICD-10-CM | POA: Insufficient documentation

## 2019-06-13 MED ORDER — IBUPROFEN 600 MG PO TABS: 600.0000 mg | ORAL_TABLET | Freq: Three times a day (TID) | ORAL | 0 refills | Status: DC

## 2019-06-13 NOTE — Discharge Instructions (Signed)
Wear the thumb splint provided as much as possible for the next week to minimize movement into her wrist your inflamed tendon.  Apply heat to the site for 15 to 20 minutes 2-3 times daily.  Use the anti-inflammatory as prescribed to also help with the healing process.  This is safe to take while breast-feeding.  Plan to follow-up with Dr. Aline Brochure for recheck of your thumb if symptoms are not improving with this treatment or symptoms return after treatment is completed.

## 2019-06-13 NOTE — ED Provider Notes (Signed)
[facility] EMERGENCY DEPARTMENT Provider Note   CSN: [id number] Arrival date & time: 06/13/19  0751     History   Chief Complaint Chief Complaint  Patient presents with  . Wrist Pain    HPI Molly Burton is a 23 y.o. female with a history of hypertension, frequent UTIs and is currently 3 months postpartum presenting with complaints of left wrist pain.  This symptom has been present for approximately 4 months, she denies injury or fall.  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  There has been no swelling to the wrist, no weakness or numbness.  Her symptoms are worsened with certain movements especially with extension of the thumb.  She has had no treatments prior to arrival.  She also has complaint of both of her earrings being "stuck" in her earlobes, was unable to remove the earrings yesterday.  She denies pain, swelling, drainage.     The history is provided by the patient.    Past Medical History:  Diagnosis Date  . Chronic kidney disease    UTI  . Hypertension     Patient Active Problem List   Diagnosis Date Noted  . Cesarean delivery delivered 03/14/2019  . Post-dates pregnancy 03/11/2019  . Supervision of normal first pregnancy 08/01/2018  . Arthralgia   . Hyperthyroidism 02/08/2018    Past Surgical History:  Procedure Laterality Date  . CESAREAN SECTION N/A 03/12/2019   Procedure: CESAREAN SECTION;  Surgeon: Truett Mainland, DO;  Location: [place] [facility] ORS;  Service: Obstetrics;  Laterality: N/A;  . TONSILLECTOMY       OB History    Gravida  1   Para  1   Term  1   Preterm      AB      Living  1     SAB      TAB      Ectopic      Multiple      Live Births  1            Home Medications    Prior to Admission medications   Medication Sig Start Date End Date Taking? Authorizing Provider  amLODipine (NORVASC) 10 MG tablet Take 1 tablet (10 mg total) by mouth daily. 03/22/19   Florian Buff, MD  desogestrel-ethinyl estradiol (MIRCETTE) 0.15-0.02/0.01 MG (21/5) tablet Take 1 tablet by mouth daily. 04/15/19   Florian Buff, MD  ibuprofen (ADVIL) 600 MG tablet Take 1 tablet (600 mg total) by mouth 3 (three) times daily. 06/13/19   Evalee Jefferson, PA-C  Prenat w/o A-FeCbGl-DSS-FA-DHA (CITRANATAL ASSURE) 35-1 & 300 MG tablet One tablet and one capsule daily Patient taking differently: Take 2 tablets by mouth See admin instructions. Take 1 tablet and 1 capsule by mouth daily 08/06/18   Roma Schanz, CNM    Family History Family History  Problem Relation Age of Onset  . Thyroid disease Maternal Grandmother   . Thyroid disease Paternal Grandmother     Social History Social History   Tobacco Use  . Smoking status: Never Smoker  . Smokeless tobacco: Never Used  Substance Use Topics  . Alcohol use: Not Currently    Comment: Pt did have wine on 08/05/2018  . Drug use: Not Currently    Types: Marijuana    Comment: not since 07/03/18     Allergies   Patient has no known allergies.   Review of Systems Review of Systems  Constitutional: Negative for chills and fever.  HENT: Negative.   Respiratory: Negative.   Cardiovascular: Negative.   Gastrointestinal: Negative.   Musculoskeletal: Positive for arthralgias. Negative for joint swelling and myalgias.  Neurological: Negative for weakness and numbness.     Physical Exam Updated Vital Signs BP (!) 128/94 (BP Location: Right Arm)   Pulse 82   Temp 98.2 F (36.8 C) (Oral)   Resp 20   Ht 5\' 2"  (1.575 m)   Wt 72.6 kg   SpO2 98%   BMI 29.26 kg/m   Physical Exam Vitals signs reviewed.  Constitutional:      Appearance: She is well-developed.  HENT:     Head: Atraumatic.     Ears:     Comments: Earrings were removed by RN prior to exam.  No ear or earlobe abnormalities present. Neck:     Musculoskeletal: Normal range of motion.  Cardiovascular:     Comments: Pulses equal bilaterally Musculoskeletal:         General: Tenderness present. No swelling, deformity or signs of injury.     Left wrist: She exhibits tenderness. She exhibits normal range of motion, no bony tenderness, no swelling, no effusion and no crepitus.     Comments: Patient has mild tenderness along her left radial wrist in a linear distribution along her left thumb extensor pollicus longus.  There is no palpable deformities, indurations, there is no crepitus with range of motion.  Pain is worsened with thumb extension.  She has a negative Finkelstein test.  Skin:    General: Skin is warm and dry.     Findings: No erythema or rash.  Neurological:     Mental Status: She is alert.     Sensory: No sensory deficit.     Deep Tendon Reflexes: Reflexes normal.      ED Treatments / Results  Labs (all labs ordered are listed, but only abnormal results are displayed) Labs Reviewed - No data to display  EKG None  Radiology No results found.  Procedures Procedures (including critical care time)  Medications Ordered in ED Medications - No data to display   Initial Impression / Assessment and Plan / ED Course  I have reviewed the triage vital signs and the nursing notes.  Pertinent labs & imaging results that were available during my care of the patient were reviewed by me and considered in my medical decision making (see chart for details).        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  She was placed in a thumb spica splint for resting the thumb and tendons.  Discussed heat therapy, anti-inflammatories.  Plan follow-up with orthopedics if symptoms persist or if they become more chronic or return after this treatment plan.  PRN follow-up anticipated.  Final Clinical Impressions(s) / ED Diagnoses   Final diagnoses:  Tendinitis of thumb    ED Discharge Orders         Ordered    ibuprofen (ADVIL) 600 MG tablet  3 times daily     06/13/19 0928           Burgess Amordol, Christene Pounds, PA-C 06/13/19 1043     Bethann BerkshireZammit, Joseph, MD 06/15/19 1546

## 2019-06-13 NOTE — ED Triage Notes (Signed)
Pt is complaining of left wrist pain since April. No injury to area. Has not tried any medications for this. Also complaining of earrings being stuck in both ears that she can't get out

## 2019-06-24 ENCOUNTER — Telehealth: Payer: Self-pay | Admitting: Advanced Practice Midwife

## 2019-06-24 NOTE — Telephone Encounter (Signed)
Pt wants a note to go back to work from having baby. Please call to discuss/ fax # [phone]

## 2019-06-26 ENCOUNTER — Encounter: Payer: Self-pay | Admitting: *Deleted

## 2019-06-26 NOTE — Telephone Encounter (Signed)
Letter to return to work prepared and faxed to work per patients request. Fax [phone]

## 2020-02-07 ENCOUNTER — Ambulatory Visit: Payer: Medicaid Other | Attending: Internal Medicine

## 2020-02-07 DIAGNOSIS — Z23 Encounter for immunization: Secondary | ICD-10-CM

## 2020-02-07 NOTE — Progress Notes (Signed)
   Covid-19 Vaccination Clinic  Name:  Molly Burton    MRN: [id number] DOB: [birth year]  02/07/2020  [name] was observed post Covid-19 immunization for 15 minutes without incident. She was provided with Vaccine Information Sheet and instruction to access the V-Safe system.   [name] was instructed to call 911 with any severe reactions post vaccine: Marland Kitchen Difficulty breathing  . Swelling of face and throat  . A fast heartbeat  . A bad rash all over body  . Dizziness and weakness   Immunizations Administered    Name Date Dose VIS Date Route   Moderna COVID-19 Vaccine 02/07/2020 11:44 AM 0.5 mL 10/29/2019 Intramuscular   Manufacturer: Moderna   Lot: [id number]   NDC: 44461-901-22

## 2020-02-17 ENCOUNTER — Encounter: Payer: Self-pay | Admitting: Women's Health

## 2020-02-17 ENCOUNTER — Other Ambulatory Visit: Payer: Self-pay

## 2020-02-17 ENCOUNTER — Other Ambulatory Visit (HOSPITAL_COMMUNITY)
Admission: RE | Admit: 2020-02-17 | Discharge: 2020-02-17 | Disposition: A | Discharge status: Home / Self Care | Payer: Medicaid Other | Source: Ambulatory Visit | Attending: Obstetrics & Gynecology | Admitting: Obstetrics & Gynecology

## 2020-02-17 ENCOUNTER — Ambulatory Visit (INDEPENDENT_AMBULATORY_CARE_PROVIDER_SITE_OTHER): Payer: Medicaid Other | Admitting: Women's Health

## 2020-02-17 VITALS — BP 122/83 | HR 72 | Ht 63.0 in | Wt 152.6 lb

## 2020-02-17 DIAGNOSIS — Z113 Encounter for screening for infections with a predominantly sexual mode of transmission: Secondary | ICD-10-CM | POA: Diagnosis not present

## 2020-02-17 DIAGNOSIS — N898 Other specified noninflammatory disorders of vagina: Secondary | ICD-10-CM

## 2020-02-17 DIAGNOSIS — N942 Vaginismus: Secondary | ICD-10-CM | POA: Diagnosis not present

## 2020-02-17 LAB — POCT WET PREP (WET MOUNT)
Clue Cells Wet Prep Whiff POC: NEGATIVE
Trichomonas Wet Prep HPF POC: ABSENT

## 2020-02-17 NOTE — Patient Instructions (Addendum)
Vaginismus, can try vaginal dilators  Primary Care Providers  Dr. Dwana Melena [place]) [phone]  [facility] [phone]  [facility]) Dr. Karilyn Cota [phone]  [facility] [place]) [phone]  [facility] [place]) [phone]  [facility] [place]) [phone]  [facility] [phone]  [facility] [phone]

## 2020-02-17 NOTE — Progress Notes (Signed)
   GYN VISIT Patient name: Molly Burton MRN [id number]  Date of birth: 1996-05-20 Chief Complaint:   STD Screening (requested to have with provider. )  History of Present Illness:   Molly Burton is a 24 y.o. G57P1001 African American female being seen today for STD screen and report of vaginal d/c w/ different odor for her, not fishy. Denies itching/irritation. Has had 1 new sex partner. Has never had an orgasm. Sex always painful on insertion.   No LMP recorded. (Menstrual status: Lactating). The current method of family planning is abstinence.  Last pap 08/29/18. Results were:  normal Review of Systems:   Pertinent items are noted in HPI Denies fever/chills, dizziness, headaches, visual disturbances, fatigue, shortness of breath, chest pain, abdominal pain, vomiting, abnormal vaginal discharge/itching/odor/irritation, problems with periods, bowel movements, urination, or intercourse unless otherwise stated above.  Pertinent History Reviewed:  Reviewed past medical,surgical, social, obstetrical and family history.  Reviewed problem list, medications and allergies. Physical Assessment:   Vitals:   02/17/20 1531 02/17/20 1535 02/17/20 1612  BP: (!) 134/98 130/83 122/83  Pulse: 76 70 72  Weight: 152 lb 9.6 oz (69.2 kg)    Height: 5\' 3"  (1.6 m)    Body mass index is 27.03 kg/m.       Physical Examination:   General appearance: alert, well appearing, and in no distress  Mental status: alert, oriented to person, place, and time  Skin: warm & dry   Cardiovascular: normal heart rate noted  Respiratory: normal respiratory effort, no distress  Abdomen: soft, non-tender   Pelvic: very tight introitus, likely vaginismus. Pederson inserted, mucousy nonodorous d/c  Extremities: no edema   Chaperone: Amanda Rash    Results for orders placed or performed in visit on 02/17/20 (from the past 24 hour(s))  POCT Wet Prep 02/19/20 [place])   Collection Time: 02/17/20  4:15 PM  Result Value Ref  Range   Source Wet Prep POC vaginal    WBC, Wet Prep HPF POC none    Bacteria Wet Prep HPF POC Few Few   BACTERIA WET PREP MORPHOLOGY POC     Clue Cells Wet Prep HPF POC None None   Clue Cells Wet Prep Whiff POC Negative Whiff    Yeast Wet Prep HPF POC None None   KOH Wet Prep POC     Trichomonas Wet Prep HPF POC Absent Absent    Assessment & Plan:  1) Normal vaginal discharge> wet prep neg  2) STD screen> gc/ct/trich/hiv/rpr  3) Never had orgasm> to try self-stimulation   4) Vaginismus> can try vaginal dilators  5) Initially elevated bp> normal on recheck  Meds: No orders of the defined types were placed in this encounter.   Orders Placed This Encounter  Procedures  . HIV Antibody (routine testing w rflx)  . RPR  . POCT Wet Prep [facility])    Return in about 1 year (around 02/16/2021) for Physical.  02/18/2021 CNM, [facility] 02/17/2020 4:15 PM

## 2020-02-18 LAB — HIV ANTIBODY (ROUTINE TESTING W REFLEX): HIV Screen 4th Generation wRfx: NONREACTIVE

## 2020-02-18 LAB — RPR: RPR Ser Ql: NONREACTIVE

## 2020-02-19 LAB — CERVICOVAGINAL ANCILLARY ONLY
Chlamydia: NEGATIVE
Comment: NEGATIVE
Comment: NEGATIVE
Comment: NORMAL
Neisseria Gonorrhea: NEGATIVE
Trichomonas: NEGATIVE

## 2020-03-04 ENCOUNTER — Ambulatory Visit: Payer: Medicaid Other | Attending: Internal Medicine

## 2020-03-04 ENCOUNTER — Other Ambulatory Visit: Payer: Self-pay

## 2020-03-04 ENCOUNTER — Encounter: Payer: Self-pay | Admitting: Emergency Medicine

## 2020-03-04 ENCOUNTER — Ambulatory Visit
Admission: EM | Admit: 2020-03-04 | Discharge: 2020-03-04 | Disposition: A | Discharge status: Home / Self Care | Payer: Medicaid Other

## 2020-03-04 DIAGNOSIS — L259 Unspecified contact dermatitis, unspecified cause: Secondary | ICD-10-CM

## 2020-03-04 DIAGNOSIS — R21 Rash and other nonspecific skin eruption: Secondary | ICD-10-CM

## 2020-03-04 DIAGNOSIS — Z23 Encounter for immunization: Secondary | ICD-10-CM

## 2020-03-04 MED ORDER — TRIAMCINOLONE ACETONIDE 0.1 % EX CREA: 1.0000 "application " | TOPICAL_CREAM | Freq: Two times a day (BID) | CUTANEOUS | 0 refills | Status: DC

## 2020-03-04 NOTE — ED Triage Notes (Signed)
Rash to face for 2-3 days.  Rash itches.    Patient did get hair weave last week.  Otherwise no new products, patient never has issues with face breaking out

## 2020-03-04 NOTE — ED Provider Notes (Signed)
[place]   [id number] 03/04/20 Arrival Time: 1009  CC: Rash  SUBJECTIVE:  Molly Burton is a 24 y.o. female who presents with a rash to face x 2-3 days.  Denies precipitating event, changes in body wash, cosmetic products, close contacts with similar symptoms, or medication changes.  Does admit to getting a hair weave last week.  Has tried aloe vera and coco butter without relief.  Symptoms are made worse with scratching.  Denies similar symptoms in the past.   Denies fever, chills, nausea, vomiting, erythema, swelling, discharge, oral lesions, SOB, chest pain, abdominal pain, changes in bowel or bladder function.    ROS: As per HPI.  All other pertinent ROS negative.     Past Medical History:  Diagnosis Date  . Chronic kidney disease    UTI  . Hypertension    Past Surgical History:  Procedure Laterality Date  . CESAREAN SECTION N/A 03/12/2019   Procedure: CESAREAN SECTION;  Surgeon: Truett Mainland, DO;  Location: [facility];  Service: Obstetrics;  Laterality: N/A;  . TONSILLECTOMY     No Known Allergies No current facility-administered medications on file prior to encounter.   Current Outpatient Medications on File Prior to Encounter  Medication Sig Dispense Refill  . Prenatal Vit-Fe Fumarate-FA (PRENATAL VITAMINS PO) Take by mouth.     Social History   Socioeconomic History  . Marital status: Single    Spouse name: Not on file  . Number of children: 1  . Years of education: N/A  . Highest education level: Bachelor's degree (e.g., BA, AB, BS)  Occupational History  . Not on file  Tobacco Use  . Smoking status: Never Smoker  . Smokeless tobacco: Never Used  Substance and Sexual Activity  . Alcohol use: Not Currently    Comment: Pt did have wine on 08/05/2018  . Drug use: Not Currently    Types: Marijuana    Comment: not since 07/03/18  . Sexual activity: Not Currently    Birth control/protection: None  Other Topics Concern  . Not on file  Social  History Narrative  . Not on file   Social Determinants of Health   Financial Resource Strain:   . Difficulty of Paying Living Expenses:   Food Insecurity:   . Worried About Charity fundraiser in the Last Year:   . Arboriculturist in the Last Year:   Transportation Needs:   . Film/video editor (Medical):   Marland Kitchen Lack of Transportation (Non-Medical):   Physical Activity:   . Days of Exercise per Week:   . Minutes of Exercise per Session:   Stress:   . Feeling of Stress :   Social Connections:   . Frequency of Communication with Friends and Family:   . Frequency of Social Gatherings with Friends and Family:   . Attends Religious Services:   . Active Member of Clubs or Organizations:   . Attends Archivist Meetings:   Marland Kitchen Marital Status:   Intimate Partner Violence:   . Fear of Current or Ex-Partner:   . Emotionally Abused:   Marland Kitchen Physically Abused:   . Sexually Abused:    Family History  Problem Relation Age of Onset  . Thyroid disease Maternal Grandmother   . Thyroid disease Paternal Grandmother     OBJECTIVE: Vitals:   03/04/20 1017  BP: 114/76  Pulse: 76  Resp: 16  Temp: 99.1 F (37.3 C)  TempSrc: Oral  SpO2: 98%  General appearance: alert; no distress Head: NCAT Eyes: EOMI grossly Throat: Oropharynx clear Lungs: normal respiratory effort Extremities: no edema Skin: warm and dry; fine papular rash diffuse about the face, NTTP, no erythema, or discharge or swelling Psychological: alert and cooperative; normal mood and affect  ASSESSMENT & PLAN:  1. Rash and nonspecific skin eruption   2. Contact dermatitis, unspecified contact dermatitis type, unspecified trigger     Meds ordered this encounter  Medications  . triamcinolone cream (KENALOG) 0.1 %    Sig: Apply 1 application topically 2 (two) times daily.    Dispense:  45 g    Refill:  0    Order Specific Question:   Supervising Provider    Answer:   Eustace Moore [[id number]]   Wash  with warm water and mild soap Avoid hot showers or baths Moisturize skin daily Triamcinolone or kenalog cream for other areas of eczema.  Avoid using greater than 2 weeks on face which may be prone to skin thinning Use OTC zyrtec, claritin, or allegra during the day Please make a follow up appointment with PCP Return or go to the ED if you have any new or worsen symptoms such as fever, chills, nausea, vomiting, redness, swelling, discharge, oral lesions, shortness of breath, chest pain, abdominal pain, changes in bowel or bladder function, etc...   Reviewed expectations re: course of current medical issues. Questions answered. Outlined signs and symptoms indicating need for more acute intervention. Patient verbalized understanding. After Visit Summary given.   Rennis Harding, PA-C 03/04/20 1040

## 2020-03-04 NOTE — Progress Notes (Signed)
   Covid-19 Vaccination Clinic  Name:  Molly Burton    MRN: [id number] DOB: 30-Mar-1996  03/04/2020  Molly Burton was observed post Covid-19 immunization for 15 minutes without incident. She was provided with Vaccine Information Sheet and instruction to access the V-Safe system.   Molly Burton was instructed to call 911 with any severe reactions post vaccine: Marland Kitchen Difficulty breathing  . Swelling of face and throat  . A fast heartbeat  . A bad rash all over body  . Dizziness and weakness   Immunizations Administered    Name Date Dose VIS Date Route   Moderna COVID-19 Vaccine 03/04/2020  1:04 PM 0.5 mL 10/29/2019 Intramuscular   Manufacturer: [name]   Lot: [id number]   NDC: 54562-563-89

## 2020-03-04 NOTE — Discharge Instructions (Addendum)
Wash with warm water and mild soap Avoid hot showers or baths Moisturize skin daily Triamcinolone or kenalog cream for other areas of eczema.  Avoid using greater than 2 weeks on face which may be prone to skin thinning Use OTC zyrtec, claritin, or allegra during the day Please make a follow up appointment with PCP Return or go to the ED if you have any new or worsen symptoms such as fever, chills, nausea, vomiting, redness, swelling, discharge, oral lesions, shortness of breath, chest pain, abdominal pain, changes in bowel or bladder function, etc..Marland Kitchen

## 2020-03-11 ENCOUNTER — Ambulatory Visit: Payer: Medicaid Other

## 2020-04-06 DIAGNOSIS — H5213 Myopia, bilateral: Secondary | ICD-10-CM | POA: Diagnosis not present

## 2020-05-14 DIAGNOSIS — H5213 Myopia, bilateral: Secondary | ICD-10-CM | POA: Diagnosis not present

## 2020-05-14 DIAGNOSIS — H52222 Regular astigmatism, left eye: Secondary | ICD-10-CM | POA: Diagnosis not present

## 2020-06-02 ENCOUNTER — Ambulatory Visit
Admission: EM | Admit: 2020-06-02 | Discharge: 2020-06-02 | Disposition: A | Discharge status: Home / Self Care | Payer: Medicaid Other | Attending: Physician Assistant | Admitting: Physician Assistant

## 2020-06-02 ENCOUNTER — Other Ambulatory Visit: Payer: Self-pay

## 2020-06-02 DIAGNOSIS — R0981 Nasal congestion: Secondary | ICD-10-CM | POA: Diagnosis not present

## 2020-06-02 DIAGNOSIS — R059 Cough, unspecified: Secondary | ICD-10-CM

## 2020-06-02 DIAGNOSIS — R05 Cough: Secondary | ICD-10-CM | POA: Diagnosis not present

## 2020-06-02 MED ORDER — ONDANSETRON 4 MG PO TBDP
4.0000 mg | ORAL_TABLET | Freq: Once | ORAL | Status: AC
  Administered 2020-06-02: 4 mg via ORAL

## 2020-06-02 MED ORDER — BENZONATATE 200 MG PO CAPS: 200.0000 mg | ORAL_CAPSULE | Freq: Three times a day (TID) | ORAL | 0 refills | Status: DC

## 2020-06-02 MED ORDER — ONDANSETRON 4 MG PO TBDP: 4.0000 mg | ORAL_TABLET | Freq: Three times a day (TID) | ORAL | 0 refills | Status: DC | PRN

## 2020-06-02 MED ORDER — FLUTICASONE PROPIONATE 50 MCG/ACT NA SUSP: 2.0000 | Freq: Every day | NASAL | 0 refills | Status: DC

## 2020-06-02 NOTE — ED Provider Notes (Signed)
[facility]    CSN: [id number] Arrival date & time: 06/02/20  1243      History   Chief Complaint Chief Complaint  Patient presents with  . Emesis    HPI Molly Burton is a 24 y.o. female.   24 year old female comes in for 1 day of URI symptoms. Productive cough, sinus pressure, nasal congestion. Nausea with 3 NBNB emesis. Tolerating fluid intake since vomiting. No diarrhea, abdominal pain. Denies shortness of breath, loss of taste/smell. Fully COVID vaccinated Molly Burton, 02/2020). LMP 05/25/2020     Past Medical History:  Diagnosis Date  . Chronic kidney disease    UTI  . Hypertension     Patient Active Problem List   Diagnosis Date Noted  . Cesarean delivery delivered 03/14/2019  . Arthralgia   . Hyperthyroidism 02/08/2018    Past Surgical History:  Procedure Laterality Date  . CESAREAN SECTION N/A 03/12/2019   Procedure: CESAREAN SECTION;  Surgeon: Levie Heritage, DO;  Location: [facility];  Service: Obstetrics;  Laterality: N/A;  . TONSILLECTOMY      OB History    Gravida  1   Para  1   Term  1   Preterm      AB      Living  1     SAB      TAB      Ectopic      Multiple      Live Births  1            Home Medications    Prior to Admission medications   Medication Sig Start Date End Date Taking? Authorizing Provider  benzonatate (TESSALON) 200 MG capsule Take 1 capsule (200 mg total) by mouth every 8 (eight) hours. 06/02/20   Cathie Hoops, Katriana Dortch V, PA-C  fluticasone (FLONASE) 50 MCG/ACT nasal spray Place 2 sprays into both nostrils daily. 06/02/20   Cathie Hoops, Braydee Shimkus V, PA-C  ondansetron (ZOFRAN ODT) 4 MG disintegrating tablet Take 1 tablet (4 mg total) by mouth every 8 (eight) hours as needed for nausea or vomiting. 06/02/20   Belinda Fisher, PA-C    Family History Family History  Problem Relation Age of Onset  . Thyroid disease Maternal Grandmother   . Thyroid disease Paternal Grandmother     Social History Social History   Tobacco Use  .  Smoking status: Never Smoker  . Smokeless tobacco: Never Used  Vaping Use  . Vaping Use: Never used  Substance Use Topics  . Alcohol use: Not Currently    Comment: Pt did have wine on 08/05/2018  . Drug use: Not Currently    Types: Marijuana    Comment: not since 07/03/18     Allergies   Patient has no known allergies.   Review of Systems Review of Systems  Reason unable to perform ROS: See HPI as above.     Physical Exam Triage Vital Signs ED Triage Vitals  Enc Vitals Group     BP 06/02/20 1258 (!) 132/92     Pulse Rate 06/02/20 1258 90     Resp 06/02/20 1258 16     Temp 06/02/20 1258 99.2 F (37.3 C)     Temp Source 06/02/20 1258 Oral     SpO2 06/02/20 1258 95 %     Weight --      Height --      Head Circumference --      Peak Flow --      Pain  Score 06/02/20 1308 0     Pain Loc --      Pain Edu? --      Excl. in GC? --    No data found.  Updated Vital Signs BP (!) 132/92 (BP Location: Left Arm)   Pulse 90   Temp 99.2 F (37.3 C) (Oral)   Resp 16   LMP 05/25/2020   SpO2 95%   Breastfeeding No   Physical Exam Constitutional:      General: She is not in acute distress.    Appearance: Normal appearance. She is well-developed. She is not ill-appearing, toxic-appearing or diaphoretic.  HENT:     Head: Normocephalic and atraumatic.     Right Ear: Tympanic membrane, ear canal and external ear normal. Tympanic membrane is not erythematous or bulging.     Left Ear: Tympanic membrane, ear canal and external ear normal. Tympanic membrane is not erythematous or bulging.     Nose:     Right Sinus: Maxillary sinus tenderness present. No frontal sinus tenderness.     Left Sinus: Maxillary sinus tenderness present. No frontal sinus tenderness.     Mouth/Throat:     Mouth: Mucous membranes are moist.     Pharynx: Oropharynx is clear. Uvula midline.  Eyes:     Conjunctiva/sclera: Conjunctivae normal.     Pupils: Pupils are equal, round, and reactive to light.    Cardiovascular:     Rate and Rhythm: Normal rate and regular rhythm.  Pulmonary:     Effort: Pulmonary effort is normal. No accessory muscle usage, prolonged expiration, respiratory distress or retractions.     Breath sounds: No decreased air movement or transmitted upper airway sounds. No decreased breath sounds.     Comments: LCTAB Musculoskeletal:     Cervical back: Normal range of motion and neck supple.  Skin:    General: Skin is warm and dry.  Neurological:     Mental Status: She is alert and oriented to person, place, and time.      UC Treatments / Results  Labs (all labs ordered are listed, but only abnormal results are displayed) Labs Reviewed  NOVEL CORONAVIRUS, NAA    EKG   Radiology No results found.  Procedures Procedures (including critical care time)  Medications Ordered in UC Medications  ondansetron (ZOFRAN-ODT) disintegrating tablet 4 mg (has no administration in time range)    Initial Impression / Assessment and Plan / UC Course  I have reviewed the triage vital signs and the nursing notes.  Pertinent labs & imaging results that were available during my care of the patient were reviewed by me and considered in my medical decision making (see chart for details).    Patient originally requesting pregnancy test due to nausea/vomiting.  However, LMP 05/25/2020, and was not sexually active prior to this, urine preg deferred.   COVID PCR test ordered. Patient to quarantine until testing results return. No alarming signs on exam.  Patient speaking in full sentences without respiratory distress.  Symptomatic treatment discussed.  Push fluids.  Return precautions given.  Patient expresses understanding and agrees to plan.  Final Clinical Impressions(s) / UC Diagnoses   Final diagnoses:  Cough  Nasal congestion    ED Prescriptions    Medication Sig Dispense Auth. Provider   fluticasone (FLONASE) 50 MCG/ACT nasal spray Place 2 sprays into both nostrils  daily. 1 g Neeya Prigmore V, PA-C   benzonatate (TESSALON) 200 MG capsule Take 1 capsule (200 mg total) by mouth every 8 (  eight) hours. 21 capsule Jesstin Studstill V, PA-C   ondansetron (ZOFRAN ODT) 4 MG disintegrating tablet Take 1 tablet (4 mg total) by mouth every 8 (eight) hours as needed for nausea or vomiting. 20 tablet Belinda Fisher, PA-C     PDMP not reviewed this encounter.   Belinda Fisher, PA-C 06/02/20 1339

## 2020-06-02 NOTE — Discharge Instructions (Signed)
COVID PCR testing ordered. I would like you to quarantine until testing results. Tessalon for cough. Zofran as needed for nausea/vomiting. Start flonase for nasal congestion/drainage. You can use over the counter nasal saline rinse such as neti pot for nasal congestion. Keep hydrated, your urine should be clear to pale yellow in color. Tylenol/motrin for fever and pain. Monitor for any worsening of symptoms, chest pain, shortness of breath, wheezing, swelling of the throat, go to the emergency department for further evaluation needed.

## 2020-06-02 NOTE — ED Triage Notes (Signed)
Pt c/o productive cough, sinus pressure, and nasal congestion last night then started vomiting around 10:30 last night and has continued. Cough and sinus pressure has subsided. Pt requesting COVID and pregnancy test. Pt states fully COVID vaccinated.

## 2020-06-03 LAB — SARS-COV-2, NAA 2 DAY TAT

## 2020-06-03 LAB — NOVEL CORONAVIRUS, NAA: SARS-CoV-2, NAA: NOT DETECTED

## 2020-07-26 ENCOUNTER — Encounter (HOSPITAL_COMMUNITY): Payer: Self-pay | Admitting: Obstetrics & Gynecology

## 2020-07-26 ENCOUNTER — Other Ambulatory Visit: Payer: Self-pay

## 2020-07-26 ENCOUNTER — Inpatient Hospital Stay (HOSPITAL_COMMUNITY)
Admission: AD | Admit: 2020-07-26 | Discharge: 2020-07-26 | Disposition: A | Discharge status: Home / Self Care | Payer: No Typology Code available for payment source | Attending: Obstetrics & Gynecology | Admitting: Obstetrics & Gynecology

## 2020-07-26 DIAGNOSIS — O10211 Pre-existing hypertensive chronic kidney disease complicating pregnancy, first trimester: Secondary | ICD-10-CM | POA: Insufficient documentation

## 2020-07-26 DIAGNOSIS — Z87440 Personal history of urinary (tract) infections: Secondary | ICD-10-CM | POA: Diagnosis not present

## 2020-07-26 DIAGNOSIS — I129 Hypertensive chronic kidney disease with stage 1 through stage 4 chronic kidney disease, or unspecified chronic kidney disease: Secondary | ICD-10-CM | POA: Diagnosis not present

## 2020-07-26 DIAGNOSIS — Z3A01 Less than 8 weeks gestation of pregnancy: Secondary | ICD-10-CM | POA: Insufficient documentation

## 2020-07-26 DIAGNOSIS — Z79899 Other long term (current) drug therapy: Secondary | ICD-10-CM | POA: Diagnosis not present

## 2020-07-26 DIAGNOSIS — O210 Mild hyperemesis gravidarum: Secondary | ICD-10-CM | POA: Insufficient documentation

## 2020-07-26 DIAGNOSIS — N189 Chronic kidney disease, unspecified: Secondary | ICD-10-CM | POA: Diagnosis not present

## 2020-07-26 LAB — CBC
HCT: 42.5 % (ref 36.0–46.0)
Hemoglobin: 14.2 g/dL (ref 12.0–15.0)
MCH: 28.9 pg (ref 26.0–34.0)
MCHC: 33.4 g/dL (ref 30.0–36.0)
MCV: 86.4 fL (ref 80.0–100.0)
Platelets: 230 10*3/uL (ref 150–400)
RBC: 4.92 MIL/uL (ref 3.87–5.11)
RDW: 12 % (ref 11.5–15.5)
WBC: 9.6 10*3/uL (ref 4.0–10.5)
nRBC: 0 % (ref 0.0–0.2)

## 2020-07-26 LAB — COMPREHENSIVE METABOLIC PANEL
ALT: 20 U/L (ref 0–44)
AST: 16 U/L (ref 15–41)
Albumin: 4.8 g/dL (ref 3.5–5.0)
Alkaline Phosphatase: 66 U/L (ref 38–126)
Anion gap: 15 (ref 5–15)
BUN: 15 mg/dL (ref 6–20)
CO2: 20 mmol/L — ABNORMAL LOW (ref 22–32)
Calcium: 9.9 mg/dL (ref 8.9–10.3)
Chloride: 100 mmol/L (ref 98–111)
Creatinine, Ser: 0.91 mg/dL (ref 0.44–1.00)
GFR calc Af Amer: >60 mL/min (ref >60)
GFR calc non Af Amer: >60 mL/min (ref >60)
Glucose, Bld: 81 mg/dL (ref 70–99)
Potassium: 3.6 mmol/L (ref 3.5–5.1)
Sodium: 135 mmol/L (ref 135–145)
Total Bilirubin: 1.8 mg/dL — ABNORMAL HIGH (ref 0.3–1.2)
Total Protein: 8.4 g/dL — ABNORMAL HIGH (ref 6.5–8.1)

## 2020-07-26 LAB — URINALYSIS, ROUTINE W REFLEX MICROSCOPIC
Glucose, UA: NEGATIVE mg/dL
Hgb urine dipstick: NEGATIVE
Ketones, ur: 80 mg/dL — AB
Leukocytes,Ua: NEGATIVE
Nitrite: NEGATIVE
Protein, ur: 100 mg/dL — AB
Specific Gravity, Urine: 1.03 (ref 1.005–1.030)
pH: 5 (ref 5.0–8.0)

## 2020-07-26 LAB — POCT PREGNANCY, URINE: Preg Test, Ur: POSITIVE — AB

## 2020-07-26 MED ORDER — METOCLOPRAMIDE HCL 10 MG PO TABS: 10.0000 mg | ORAL_TABLET | Freq: Once | ORAL | Status: DC

## 2020-07-26 MED ORDER — LACTATED RINGERS IV BOLUS
1000.0000 mL | Freq: Once | INTRAVENOUS | Status: AC
  Administered 2020-07-26: 1000 mL via INTRAVENOUS

## 2020-07-26 MED ORDER — PROMETHAZINE HCL 25 MG PO TABS: ORAL_TABLET | ORAL | 0 refills | Status: DC

## 2020-07-26 MED ORDER — METOCLOPRAMIDE HCL 10 MG PO TABS: 10.0000 mg | ORAL_TABLET | Freq: Four times a day (QID) | ORAL | 1 refills | Status: DC | PRN

## 2020-07-26 MED ORDER — METOCLOPRAMIDE HCL 5 MG/ML IJ SOLN
10.0000 mg | Freq: Once | INTRAMUSCULAR | Status: AC
  Administered 2020-07-26: 10 mg via INTRAVENOUS
  Filled 2020-07-26: qty 2

## 2020-07-26 NOTE — MAU Note (Signed)
Patient states she is feeling better after IV fluids and Reglan. States she was able to take a nap.  PO challenge with ginger ale and crackers.

## 2020-07-26 NOTE — MAU Note (Signed)
Patient tolerated ginger ale and crackers

## 2020-07-26 NOTE — MAU Note (Signed)
Molly Burton is a 24 y.o. at [redacted]w[redacted]d here in MAU reporting: has been sick for a week. Was previously prescribed zofran and tried that several times between Thursday to yesterday and it did help but found out she was pregnant yesterday so she stopped taking it.   LMP: 06/18/20  Onset of complaint: ongoing  Pain score: 0/10  Vitals:   07/26/20 1118  BP: 108/81  Pulse: 90  Resp: 16  Temp: 98.3 F (36.8 C)  SpO2: 99%     Lab orders placed from triage: UPT UA

## 2020-07-26 NOTE — Discharge Instructions (Signed)
Prenatal Care Providers  [facility]  Phone: [phone]  [facility] Phone: [phone]  [facility] Phone: [phone]  [facility] Phone: [phone]  [facility] Phone: [phone]  [facility]  Phone: [phone]   Morning Sickness  Morning sickness is when you feel sick to your stomach (nauseous) during pregnancy. You may feel sick to your stomach and throw up (vomit). You may feel sick in the morning, but you can feel this way at any time of day. Some women feel very sick to their stomach and cannot stop throwing up (hyperemesis gravidarum). Follow these instructions at home: Medicines  Take over-the-counter and prescription medicines only as told by your doctor. Do not take any medicines until you talk with your doctor about them first.  Taking multivitamins before getting pregnant can stop or lessen the harshness of morning sickness. Eating and drinking  Eat dry toast or crackers before getting out of bed.  Eat 5 or 6 small meals a day.  Eat dry and bland foods like rice and baked potatoes.  Do not eat greasy, fatty, or spicy foods.  Have someone cook for you if the smell of food causes you to feel sick or throw up.  If you feel sick to your stomach after taking prenatal vitamins, take them at night or with a snack.  Eat protein when you need a snack. Nuts, yogurt, and cheese are good choices.  Drink fluids throughout the day.  Try ginger ale made with real ginger, ginger tea made from fresh grated ginger, or ginger candies. General instructions  Do not use any products that have nicotine or tobacco in them, such as cigarettes and e-cigarettes. If you need help quitting, ask your doctor.  Use an air purifier to keep the air in your house free of smells.  Get lots of fresh air.  Try to avoid smells that make you feel sick.  Try: ? Wearing a bracelet that is used for  seasickness (acupressure wristband). ? Going to a doctor who puts thin needles into certain body points (acupuncture) to improve how you feel. Contact a doctor if:  You need medicine to feel better.  You feel dizzy or light-headed.  You are losing weight. Get help right away if:  You feel very sick to your stomach and cannot stop throwing up.  You pass out (faint).  You have very bad pain in your belly. Summary  Morning sickness is when you feel sick to your stomach (nauseous) during pregnancy.  You may feel sick in the morning, but you can feel this way at any time of day.  Making some changes to what you eat may help your symptoms go away. This information is not intended to replace advice given to you by your health care provider. Make sure you discuss any questions you have with your health care provider. Document Revised: 10/27/2017 Document Reviewed: 12/15/2016 Elsevier Patient Education  2020 [employer].

## 2020-07-26 NOTE — MAU Provider Note (Signed)
Patient Molly Burton is a 24 y.o. G2P1001  at 101w3d here with complaints of nausea and vomiting. She found out she was pregnant yesterday. She felt sick all week. She had been an old prescription for Zofran (given to her at Urgent Care a month ago when she had nausea).  She denies any abdominal pain, vaginal bleeding, dysuria.  History     CSN: [id number]  Arrival date and time: 07/26/20 1100   First Provider Initiated Contact with Patient 07/26/20 1250      Chief Complaint  Patient presents with  . Nausea  . Emesis   Emesis  This is a new problem. The current episode started in the past 7 days. The problem occurs 5 to 10 times per day. The emesis has an appearance of bile. There has been no fever. Pertinent negatives include no chills, coughing, diarrhea, dizziness, fever or headaches.    OB History    Gravida  2   Para  1   Term  1   Preterm      AB      Living  1     SAB      TAB      Ectopic      Multiple      Live Births  1           Past Medical History:  Diagnosis Date  . Chronic kidney disease    UTI  . Hypertension     Past Surgical History:  Procedure Laterality Date  . CESAREAN SECTION N/A 03/12/2019   Procedure: CESAREAN SECTION;  Surgeon: Levie Heritage, DO;  Location: [facility];  Service: Obstetrics;  Laterality: N/A;  . TONSILLECTOMY      Family History  Problem Relation Age of Onset  . Thyroid disease Maternal Grandmother   . Thyroid disease Paternal Grandmother     Social History   Tobacco Use  . Smoking status: Never Smoker  . Smokeless tobacco: Never Used  Vaping Use  . Vaping Use: Never used  Substance Use Topics  . Alcohol use: Not Currently    Comment: Pt did have wine on 08/05/2018  . Drug use: Not Currently    Types: Marijuana    Comment: not since 07/03/18    Allergies: No Known Allergies  Medications Prior to Admission  Medication Sig Dispense Refill Last Dose  . benzonatate (TESSALON) 200 MG capsule  Take 1 capsule (200 mg total) by mouth every 8 (eight) hours. 21 capsule 0   . fluticasone (FLONASE) 50 MCG/ACT nasal spray Place 2 sprays into both nostrils daily. 1 g 0   . ondansetron (ZOFRAN ODT) 4 MG disintegrating tablet Take 1 tablet (4 mg total) by mouth every 8 (eight) hours as needed for nausea or vomiting. 20 tablet 0     Review of Systems  Constitutional: Negative for chills and fever.  Respiratory: Negative for cough.   Gastrointestinal: Positive for vomiting. Negative for diarrhea.  Genitourinary: Negative.   Musculoskeletal: Negative.   Neurological: Negative for dizziness and headaches.  Hematological: Negative.   Psychiatric/Behavioral: Negative.    Physical Exam   Blood pressure 108/81, pulse 90, temperature 98.3 F (36.8 C), temperature source Oral, resp. rate 16, height 5\' 2"  (1.575 m), weight 57.5 kg, last menstrual period 06/18/2020, SpO2 99 %, not currently breastfeeding.  Physical Exam Constitutional:      Appearance: Normal appearance.  HENT:     Head: Normocephalic.  Cardiovascular:     Pulses: Normal pulses.  Pulmonary:     Effort: Pulmonary effort is normal.  Musculoskeletal:        General: Normal range of motion.  Skin:    General: Skin is warm.  Neurological:     Mental Status: She is alert.     MAU Course  Procedures  MDM -UA shows ketones, will give IV bolus and draw labs.  -Patient prefers Reglan over Zofran; will give Reglan instead IV.  -Patient feels better; has tolerated soda and saltines.  -CBC and CMP are normal; no signs of hypokalemia.  -Vomiting resolved while in MAU.  Assessment and Plan   1. Morning sickness    2. Patient stable for discharge with RX for phenergan and Reglan 3. List of OB providers given; list of diet recommendations and morning sickness management.  4. First trimester precautions reviewed.   Charlesetta Garibaldi Arohi Salvatierra 07/26/2020, 1:03 PM

## 2020-08-04 ENCOUNTER — Telehealth: Payer: Self-pay | Admitting: Adult Health

## 2020-08-04 MED ORDER — PROMETHAZINE HCL 25 MG PO TABS: ORAL_TABLET | ORAL | 1 refills | Status: DC

## 2020-08-04 NOTE — Addendum Note (Signed)
Addended by: Cyril Mourning A on: 08/04/2020 12:39 PM   Modules accepted: Orders

## 2020-08-04 NOTE — Telephone Encounter (Signed)
Will rx phenergan  

## 2020-08-04 NOTE — Telephone Encounter (Signed)
Pt called stating that she is having a rough time with Vomiting and constipation and she would like to know if we could call her in something for Nausea to [facility] [address] in [place], [place]. Pt is pregnant please contact pt

## 2020-08-06 ENCOUNTER — Other Ambulatory Visit: Payer: Self-pay

## 2020-08-06 ENCOUNTER — Inpatient Hospital Stay (HOSPITAL_COMMUNITY)
Admission: AD | Admit: 2020-08-06 | Discharge: 2020-08-06 | Disposition: A | Discharge status: Home / Self Care | Payer: No Typology Code available for payment source | Attending: Obstetrics & Gynecology | Admitting: Obstetrics & Gynecology

## 2020-08-06 ENCOUNTER — Other Ambulatory Visit: Payer: No Typology Code available for payment source

## 2020-08-06 DIAGNOSIS — N189 Chronic kidney disease, unspecified: Secondary | ICD-10-CM | POA: Insufficient documentation

## 2020-08-06 DIAGNOSIS — Z3A01 Less than 8 weeks gestation of pregnancy: Secondary | ICD-10-CM | POA: Diagnosis not present

## 2020-08-06 DIAGNOSIS — O219 Vomiting of pregnancy, unspecified: Secondary | ICD-10-CM

## 2020-08-06 DIAGNOSIS — I129 Hypertensive chronic kidney disease with stage 1 through stage 4 chronic kidney disease, or unspecified chronic kidney disease: Secondary | ICD-10-CM | POA: Diagnosis not present

## 2020-08-06 DIAGNOSIS — O10211 Pre-existing hypertensive chronic kidney disease complicating pregnancy, first trimester: Secondary | ICD-10-CM | POA: Diagnosis not present

## 2020-08-06 DIAGNOSIS — O210 Mild hyperemesis gravidarum: Secondary | ICD-10-CM

## 2020-08-06 LAB — BASIC METABOLIC PANEL
Anion gap: 12 (ref 5–15)
BUN: 14 mg/dL (ref 6–20)
CO2: 23 mmol/L (ref 22–32)
Calcium: 9.8 mg/dL (ref 8.9–10.3)
Chloride: 100 mmol/L (ref 98–111)
Creatinine, Ser: 0.75 mg/dL (ref 0.44–1.00)
GFR calc Af Amer: >60 mL/min (ref >60)
GFR calc non Af Amer: >60 mL/min (ref >60)
Glucose, Bld: 88 mg/dL (ref 70–99)
Potassium: 4.6 mmol/L (ref 3.5–5.1)
Sodium: 135 mmol/L (ref 135–145)

## 2020-08-06 LAB — CBC
HCT: 39.5 % (ref 36.0–46.0)
Hemoglobin: 13.2 g/dL (ref 12.0–15.0)
MCH: 29.5 pg (ref 26.0–34.0)
MCHC: 33.4 g/dL (ref 30.0–36.0)
MCV: 88.2 fL (ref 80.0–100.0)
Platelets: 229 10*3/uL (ref 150–400)
RBC: 4.48 MIL/uL (ref 3.87–5.11)
RDW: 12 % (ref 11.5–15.5)
WBC: 9.7 10*3/uL (ref 4.0–10.5)
nRBC: 0 % (ref 0.0–0.2)

## 2020-08-06 LAB — URINALYSIS, ROUTINE W REFLEX MICROSCOPIC
Bilirubin Urine: NEGATIVE
Glucose, UA: NEGATIVE mg/dL
Hgb urine dipstick: NEGATIVE
Ketones, ur: 80 mg/dL — AB
Leukocytes,Ua: NEGATIVE
Nitrite: NEGATIVE
Protein, ur: 100 mg/dL — AB
Specific Gravity, Urine: 1.035 — ABNORMAL HIGH (ref 1.005–1.030)
pH: 6 (ref 5.0–8.0)

## 2020-08-06 MED ORDER — SCOPOLAMINE 1 MG/3DAYS TD PT72
1.0000 | MEDICATED_PATCH | TRANSDERMAL | Status: DC
  Administered 2020-08-06: 1.5 mg via TRANSDERMAL
  Filled 2020-08-06: qty 1

## 2020-08-06 MED ORDER — LACTATED RINGERS IV BOLUS
1000.0000 mL | Freq: Once | INTRAVENOUS | Status: AC
  Administered 2020-08-06: 1000 mL via INTRAVENOUS

## 2020-08-06 MED ORDER — SCOPOLAMINE 1 MG/3DAYS TD PT72: 1.0000 | MEDICATED_PATCH | TRANSDERMAL | 1 refills | Status: DC

## 2020-08-06 MED ORDER — FAMOTIDINE IN NACL 20-0.9 MG/50ML-% IV SOLN
20.0000 mg | Freq: Once | INTRAVENOUS | Status: AC
  Administered 2020-08-06: 20 mg via INTRAVENOUS
  Filled 2020-08-06: qty 50

## 2020-08-06 MED ORDER — DOXYLAMINE-PYRIDOXINE 10-10 MG PO TBEC: 2.0000 | DELAYED_RELEASE_TABLET | Freq: Every day | ORAL | 1 refills | Status: DC

## 2020-08-06 MED ORDER — PROMETHAZINE HCL 25 MG/ML IJ SOLN
25.0000 mg | Freq: Four times a day (QID) | INTRAMUSCULAR | Status: DC | PRN
  Administered 2020-08-06: 25 mg via INTRAVENOUS
  Filled 2020-08-06: qty 1

## 2020-08-06 MED ORDER — FAMOTIDINE 20 MG PO TABS: 20.0000 mg | ORAL_TABLET | Freq: Every day | ORAL | 1 refills | Status: DC

## 2020-08-06 NOTE — MAU Note (Signed)
Pt reports she has not had a BM in 3 weeks, everything she tries to eat or drink comes back up x 3 weeks. Denies pain, states she is very weak.

## 2020-08-06 NOTE — Discharge Instructions (Signed)
Morning Sickness ° °Morning sickness is when you feel sick to your stomach (nauseous) during pregnancy. You may feel sick to your stomach and throw up (vomit). You may feel sick in the morning, but you can feel this way at any time of day. Some women feel very sick to their stomach and cannot stop throwing up (hyperemesis gravidarum). °Follow these instructions at home: °Medicines °· Take over-the-counter and prescription medicines only as told by your doctor. Do not take any medicines until you talk with your doctor about them first. °· Taking multivitamins before getting pregnant can stop or lessen the harshness of morning sickness. °Eating and drinking °· Eat dry toast or crackers before getting out of bed. °· Eat 5 or 6 small meals a day. °· Eat dry and bland foods like rice and baked potatoes. °· Do not eat greasy, fatty, or spicy foods. °· Have someone cook for you if the smell of food causes you to feel sick or throw up. °· If you feel sick to your stomach after taking prenatal vitamins, take them at night or with a snack. °· Eat protein when you need a snack. Nuts, yogurt, and cheese are good choices. °· Drink fluids throughout the day. °· Try ginger ale made with real ginger, ginger tea made from fresh grated ginger, or ginger candies. °General instructions °· Do not use any products that have nicotine or tobacco in them, such as cigarettes and e-cigarettes. If you need help quitting, ask your doctor. °· Use an air purifier to keep the air in your house free of smells. °· Get lots of fresh air. °· Try to avoid smells that make you feel sick. °· Try: °? Wearing a bracelet that is used for seasickness (acupressure wristband). °? Going to a doctor who puts thin needles into certain body points (acupuncture) to improve how you feel. °Contact a doctor if: °· You need medicine to feel better. °· You feel dizzy or light-headed. °· You are losing weight. °Get help right away if: °· You feel very sick to your  stomach and cannot stop throwing up. °· You pass out (faint). °· You have very bad pain in your belly. °Summary °· Morning sickness is when you feel sick to your stomach (nauseous) during pregnancy. °· You may feel sick in the morning, but you can feel this way at any time of day. °· Making some changes to what you eat may help your symptoms go away. °This information is not intended to replace advice given to you by your health care provider. Make sure you discuss any questions you have with your health care provider. °Document Revised: 10/27/2017 Document Reviewed: 12/15/2016 °Elsevier Patient Education © 2020 Elsevier Inc. ° °

## 2020-08-06 NOTE — MAU Provider Note (Signed)
History     CSN: [id number]  Arrival date and time: 08/06/20 1238   First Provider Initiated Contact with Patient 08/06/20 1400      Chief Complaint  Patient presents with  . Hyperemesis Gravidarum   24 y.o. G2P1001 @[redacted]w[redacted]d  presenting with N/V and weakness. Reports 3 weeks of morning sickness unrelieved with Zofran, Reglan, and Phenergan. She is unable to tolerate anything even liquids po. Also reports no BM in 3 weeks. Denies abdominal pain or VB. Denies recent MJ use, none in about a month. Has lost about 6 lbs in the last 10 days but states more than that since the beginning of pregnancy.   OB History    Gravida  2   Para  1   Term  1   Preterm      AB      Living  1     SAB      TAB      Ectopic      Multiple      Live Births  1           Past Medical History:  Diagnosis Date  . Chronic kidney disease    UTI  . Hypertension     Past Surgical History:  Procedure Laterality Date  . CESAREAN SECTION N/A 03/12/2019   Procedure: CESAREAN SECTION;  Surgeon: 03/14/2019, DO;  Location: [facility];  Service: Obstetrics;  Laterality: N/A;  . TONSILLECTOMY      Family History  Problem Relation Age of Onset  . Thyroid disease Maternal Grandmother   . Thyroid disease Paternal Grandmother     Social History   Tobacco Use  . Smoking status: Never Smoker  . Smokeless tobacco: Never Used  Vaping Use  . Vaping Use: Never used  Substance Use Topics  . Alcohol use: Not Currently    Comment: Pt did have wine on 08/05/2018  . Drug use: Not Currently    Types: Marijuana    Comment: not since 07/03/18    Allergies: No Known Allergies  Medications Prior to Admission  Medication Sig Dispense Refill Last Dose  . ondansetron (ZOFRAN ODT) 4 MG disintegrating tablet Take 1 tablet (4 mg total) by mouth every 8 (eight) hours as needed for nausea or vomiting. 20 tablet 0 07/23/2020 at Unknown time  . Prenatal Vit-Fe Fumarate-FA (MULTIVITAMIN-PRENATAL) 27-0.8 MG  TABS tablet Take 1 tablet by mouth daily at 12 noon.   08/05/2020 at Unknown time  . benzonatate (TESSALON) 200 MG capsule Take 1 capsule (200 mg total) by mouth every 8 (eight) hours. 21 capsule 0 Unknown at Unknown time  . fluticasone (FLONASE) 50 MCG/ACT nasal spray Place 2 sprays into both nostrils daily. 1 g 0 Unknown at Unknown time  . metoCLOPramide (REGLAN) 10 MG tablet Take 1 tablet (10 mg total) by mouth every 6 (six) hours as needed for nausea. 90 tablet 1 07/23/2020  . promethazine (PHENERGAN) 25 MG tablet Take 1 every 6 hours prn nausea and vomiting 30 tablet 1 08/03/2020    Review of Systems  Gastrointestinal: Positive for nausea and vomiting. Negative for abdominal pain.  Genitourinary: Negative for vaginal bleeding.   Physical Exam   Blood pressure 135/72, pulse 93, temperature 98.2 F (36.8 C), temperature source Oral, resp. rate 17, height 5\' 2"  (1.575 m), weight 55.8 kg, last menstrual period 06/18/2020, SpO2 98 %, not currently breastfeeding.  Physical Exam Vitals and nursing note reviewed.  Constitutional:      General: She  is not in acute distress.    Appearance: Normal appearance.  HENT:     Head: Normocephalic and atraumatic.  Cardiovascular:     Rate and Rhythm: Normal rate.  Pulmonary:     Effort: Pulmonary effort is normal. No respiratory distress.  Abdominal:     General: There is no distension.     Palpations: Abdomen is soft.     Tenderness: There is no abdominal tenderness. There is no guarding or rebound.  Musculoskeletal:        General: Normal range of motion.  Skin:    General: Skin is warm and dry.  Neurological:     General: No focal deficit present.     Mental Status: She is alert and oriented to person, place, and time.  Psychiatric:        Mood and Affect: Mood normal.    Results for orders placed or performed during the hospital encounter of 08/06/20 (from the past 24 hour(s))  Urinalysis, Routine w reflex microscopic Urine, Clean Catch      Status: Abnormal   Collection Time: 08/06/20  1:01 PM  Result Value Ref Range   Color, Urine AMBER (A) YELLOW   APPearance HAZY (A) CLEAR   Specific Gravity, Urine 1.035 (H) 1.005 - 1.030   pH 6.0 5.0 - 8.0   Glucose, UA NEGATIVE NEGATIVE mg/dL   Hgb urine dipstick NEGATIVE NEGATIVE   Bilirubin Urine NEGATIVE NEGATIVE   Ketones, ur 80 (A) NEGATIVE mg/dL   Protein, ur 878 (A) NEGATIVE mg/dL   Nitrite NEGATIVE NEGATIVE   Leukocytes,Ua NEGATIVE NEGATIVE   RBC / HPF 0-5 0 - 5 RBC/hpf   WBC, UA 0-5 0 - 5 WBC/hpf   Bacteria, UA RARE (A) NONE SEEN   Squamous Epithelial / LPF 0-5 0 - 5   Mucus PRESENT    Hyaline Casts, UA PRESENT   CBC     Status: None   Collection Time: 08/06/20  2:00 PM  Result Value Ref Range   WBC 9.7 4.0 - 10.5 K/uL   RBC 4.48 3.87 - 5.11 MIL/uL   Hemoglobin 13.2 12.0 - 15.0 g/dL   HCT 67.6 36 - 46 %   MCV 88.2 80.0 - 100.0 fL   MCH 29.5 26.0 - 34.0 pg   MCHC 33.4 30.0 - 36.0 g/dL   RDW 72.0 94.7 - 09.6 %   Platelets 229 150 - 400 K/uL   nRBC 0.0 0.0 - 0.2 %  Basic metabolic panel     Status: None   Collection Time: 08/06/20  2:00 PM  Result Value Ref Range   Sodium 135 135 - 145 mmol/L   Potassium 4.6 3.5 - 5.1 mmol/L   Chloride 100 98 - 111 mmol/L   CO2 23 22 - 32 mmol/L   Glucose, Bld 88 70 - 99 mg/dL   BUN 14 6 - 20 mg/dL   Creatinine, Ser 2.83 0.44 - 1.00 mg/dL   Calcium 9.8 8.9 - 66.2 mg/dL   GFR calc non Af Amer >60 >60 mL/min   GFR calc Af Amer >60 >60 mL/min   Anion gap 12 5 - 15   MAU Course  Procedures Orders Placed This Encounter  Procedures  . Urinalysis, Routine w reflex microscopic Urine, Clean Catch    Standing Status:   Standing    Number of Occurrences:   1  . CBC    Standing Status:   Standing    Number of Occurrences:   1  . Basic  metabolic panel    Standing Status:   Standing    Number of Occurrences:   1   Meds ordered this encounter  Medications  . lactated ringers bolus 1,000 mL  . famotidine (PEPCID) IVPB 20 mg  premix  . scopolamine (TRANSDERM-SCOP) 1 MG/3DAYS 1.5 mg  . promethazine (PHENERGAN) injection 25 mg   MDM Labs ordered and reviewed. No emesis observed while here. Tolerating Sprite and crackers. Instructed to use Diclegis, Pepcid, and Scopolamine, and may use either Zofran, Reglan, or Phenergan for breakthrough N/V. Stable for discharge home.  Assessment and Plan   1. [redacted] weeks gestation of pregnancy   2. Morning sickness    Discharge home Follow up at [facility] as scheduled Rx Scopolamine Rx Diclegis Rx Pepcid  Allergies as of 08/06/2020   No Known Allergies     Medication List    STOP taking these medications   benzonatate 200 MG capsule Commonly known as: TESSALON   fluticasone 50 MCG/ACT nasal spray Commonly known as: FLONASE     TAKE these medications   Doxylamine-Pyridoxine 10-10 MG Tbec Take 2 tablets by mouth at bedtime. May take 1 tab in am and 1 tab in afternoon   famotidine 20 MG tablet Commonly known as: PEPCID Take 1 tablet (20 mg total) by mouth at bedtime.   metoCLOPramide 10 MG tablet Commonly known as: Reglan Take 1 tablet (10 mg total) by mouth every 6 (six) hours as needed for nausea.   multivitamin-prenatal 27-0.8 MG Tabs tablet Take 1 tablet by mouth daily at 12 noon.   ondansetron 4 MG disintegrating tablet Commonly known as: Zofran ODT Take 1 tablet (4 mg total) by mouth every 8 (eight) hours as needed for nausea or vomiting.   promethazine 25 MG tablet Commonly known as: PHENERGAN Take 1 every 6 hours prn nausea and vomiting   scopolamine 1 MG/3DAYS Commonly known as: TRANSDERM-SCOP Place 1 patch (1.5 mg total) onto the skin every 3 (three) days. Start taking on: August 09, 2020      Donette Larry, [place] 08/06/2020, 3:36 PM

## 2020-08-12 ENCOUNTER — Telehealth: Payer: Self-pay | Admitting: Women's Health

## 2020-08-12 MED ORDER — CITRANATAL ASSURE 35-1 & 300 MG PO MISC: ORAL | 11 refills | Status: DC

## 2020-08-12 NOTE — Telephone Encounter (Addendum)
Pt is requesting Citranatal Asure be sent to pharmacy. Thanks!! JSY

## 2020-08-12 NOTE — Telephone Encounter (Signed)
Patient returned nurse call. Please return on # [phone]

## 2020-08-12 NOTE — Telephone Encounter (Signed)
Pt has appt for dating [name] on 9-24 would like some prenatals called in for her she would like the same as her last preg/ [facility] [address] in [place]

## 2020-08-12 NOTE — Addendum Note (Signed)
Addended by: Cheral Marker on: 08/12/2020 12:36 PM   Modules accepted: Orders

## 2020-08-12 NOTE — Telephone Encounter (Signed)
Left message @ 10:42 am. JSY

## 2020-08-17 ENCOUNTER — Telehealth: Payer: Self-pay | Admitting: Women's Health

## 2020-08-17 NOTE — Telephone Encounter (Signed)
Pt's prenatal vit has 2 Vit B6 pills also in pack. Pt is on different nausea meds that's helping. Pt was advised can take prenatal and don't worry about taking the Vit B6 since the other nausea meds are helping. Pt voiced understanding. JSY

## 2020-08-17 NOTE — Telephone Encounter (Signed)
Patient had questions regarding prenatal vitamins that was sent last week

## 2020-08-18 ENCOUNTER — Other Ambulatory Visit: Payer: Self-pay | Admitting: Obstetrics & Gynecology

## 2020-08-18 DIAGNOSIS — O3680X0 Pregnancy with inconclusive fetal viability, not applicable or unspecified: Secondary | ICD-10-CM

## 2020-08-19 ENCOUNTER — Telehealth: Payer: Self-pay | Admitting: Women's Health

## 2020-08-19 NOTE — Telephone Encounter (Signed)
Patient states she has had exposure to gnats in her unit and the unit is currently working on getting rid of them. Patient states the gnats are biting and would like to know if this is endangering her with her being in first trimester.

## 2020-08-21 ENCOUNTER — Ambulatory Visit (INDEPENDENT_AMBULATORY_CARE_PROVIDER_SITE_OTHER): Payer: No Typology Code available for payment source

## 2020-08-21 ENCOUNTER — Other Ambulatory Visit: Payer: No Typology Code available for payment source

## 2020-08-21 ENCOUNTER — Other Ambulatory Visit: Payer: Self-pay | Admitting: Obstetrics & Gynecology

## 2020-08-21 DIAGNOSIS — O3680X0 Pregnancy with inconclusive fetal viability, not applicable or unspecified: Secondary | ICD-10-CM

## 2020-08-21 DIAGNOSIS — Z3A09 9 weeks gestation of pregnancy: Secondary | ICD-10-CM

## 2020-08-21 DIAGNOSIS — O30001 Twin pregnancy, unspecified number of placenta and unspecified number of amniotic sacs, first trimester: Secondary | ICD-10-CM

## 2020-08-21 NOTE — Progress Notes (Signed)
[name] 9+1 wks DI/DI twins,normal ovaries BABY A right,CRL 28.37 mm,fhr 167 bpm BABY B left,CRL 27.48 mm,fhr 171

## 2020-08-24 ENCOUNTER — Telehealth: Payer: Self-pay | Admitting: Women's Health

## 2020-08-24 NOTE — Telephone Encounter (Signed)
Pt is taking Citranatal B Calm. She has purchased an additional Vit C and Iron. I spoke with Louie Bun, CNM and she advised pt don't need additional Vit C or Iron. Pt aware and voiced understanding. JSY

## 2020-08-24 NOTE — Telephone Encounter (Signed)
Patient called stating that she has a question for [place]. Pt states she has a question regarding a vitamin while she is pregnant. Please contact pt

## 2020-08-24 NOTE — Telephone Encounter (Signed)
Left message @ 12:24 pm. JSY

## 2020-08-26 ENCOUNTER — Telehealth: Payer: Self-pay | Admitting: *Deleted

## 2020-08-26 NOTE — Telephone Encounter (Signed)
Pt called with cramping and was concerned. Pt was reassured that cramping can happen in early pregnancy and even spotting. Pt did not mention having spotting. I advised can take Tylenol if she feels the need to and also pt was advised to keep self well hydrated. Pt voiced understanding and was advised to call back with any concerns. JSY

## 2020-09-15 ENCOUNTER — Other Ambulatory Visit: Payer: No Typology Code available for payment source

## 2020-09-15 ENCOUNTER — Other Ambulatory Visit: Payer: Self-pay | Admitting: Obstetrics & Gynecology

## 2020-09-15 DIAGNOSIS — O30001 Twin pregnancy, unspecified number of placenta and unspecified number of amniotic sacs, first trimester: Secondary | ICD-10-CM

## 2020-09-15 DIAGNOSIS — Z3682 Encounter for antenatal screening for nuchal translucency: Secondary | ICD-10-CM

## 2020-09-16 ENCOUNTER — Encounter: Payer: Self-pay | Admitting: Advanced Practice Midwife

## 2020-09-16 ENCOUNTER — Other Ambulatory Visit: Payer: Self-pay

## 2020-09-16 ENCOUNTER — Ambulatory Visit: Payer: No Typology Code available for payment source | Admitting: *Deleted

## 2020-09-16 ENCOUNTER — Ambulatory Visit (INDEPENDENT_AMBULATORY_CARE_PROVIDER_SITE_OTHER): Payer: No Typology Code available for payment source | Admitting: Advanced Practice Midwife

## 2020-09-16 ENCOUNTER — Ambulatory Visit (INDEPENDENT_AMBULATORY_CARE_PROVIDER_SITE_OTHER): Payer: No Typology Code available for payment source

## 2020-09-16 VITALS — BP 126/81 | HR 88

## 2020-09-16 DIAGNOSIS — O30041 Twin pregnancy, dichorionic/diamniotic, first trimester: Secondary | ICD-10-CM

## 2020-09-16 DIAGNOSIS — O0990 Supervision of high risk pregnancy, unspecified, unspecified trimester: Secondary | ICD-10-CM | POA: Insufficient documentation

## 2020-09-16 DIAGNOSIS — Z8759 Personal history of other complications of pregnancy, childbirth and the puerperium: Secondary | ICD-10-CM

## 2020-09-16 DIAGNOSIS — Z1389 Encounter for screening for other disorder: Secondary | ICD-10-CM

## 2020-09-16 DIAGNOSIS — O0991 Supervision of high risk pregnancy, unspecified, first trimester: Secondary | ICD-10-CM

## 2020-09-16 DIAGNOSIS — Z363 Encounter for antenatal screening for malformations: Secondary | ICD-10-CM

## 2020-09-16 DIAGNOSIS — O30001 Twin pregnancy, unspecified number of placenta and unspecified number of amniotic sacs, first trimester: Secondary | ICD-10-CM

## 2020-09-16 DIAGNOSIS — O1400 Mild to moderate pre-eclampsia, unspecified trimester: Secondary | ICD-10-CM | POA: Insufficient documentation

## 2020-09-16 DIAGNOSIS — Z98891 History of uterine scar from previous surgery: Secondary | ICD-10-CM

## 2020-09-16 DIAGNOSIS — E0590 Thyrotoxicosis, unspecified without thyrotoxic crisis or storm: Secondary | ICD-10-CM

## 2020-09-16 DIAGNOSIS — Z3682 Encounter for antenatal screening for nuchal translucency: Secondary | ICD-10-CM | POA: Diagnosis not present

## 2020-09-16 DIAGNOSIS — Z331 Pregnant state, incidental: Secondary | ICD-10-CM

## 2020-09-16 DIAGNOSIS — Z3A12 12 weeks gestation of pregnancy: Secondary | ICD-10-CM

## 2020-09-16 DIAGNOSIS — O30049 Twin pregnancy, dichorionic/diamniotic, unspecified trimester: Secondary | ICD-10-CM | POA: Insufficient documentation

## 2020-09-16 LAB — POCT URINALYSIS DIPSTICK OB
Blood, UA: NEGATIVE
Glucose, UA: NEGATIVE
Ketones, UA: NEGATIVE
Leukocytes, UA: NEGATIVE
Nitrite, UA: NEGATIVE
POC,PROTEIN,UA: NEGATIVE

## 2020-09-16 MED ORDER — ASPIRIN 81 MG PO CHEW: 162.0000 mg | CHEWABLE_TABLET | Freq: Every day | ORAL | 7 refills | Status: DC

## 2020-09-16 NOTE — Progress Notes (Signed)
INITIAL OBSTETRICAL VISIT Patient name: Molly Burton MRN [id number]  Date of birth: 28-Nov-1996 Chief Complaint:   Initial Prenatal Visit (nt/it)  History of Present Illness:   Molly Burton is a 24 y.o. G81P1001 African American female at [redacted]w[redacted]d by LMP c/w u/s at 9.1 weeks with an Estimated Date of Delivery: 03/25/21 being seen today for her initial obstetrical visit.   Her obstetrical history is significant for hx pLTCS for fetal intolerance; ppHTN; currently with di/di twins.   Today she reports extreme exhaustion.  Depression screen [facility] 2/9 09/16/2020 08/01/2018  Decreased Interest 3 0  Down, Depressed, Hopeless 0 0  PHQ - 2 Score 3 0  Altered sleeping 0 0  Tired, decreased energy 3 1  Change in appetite 0 1  Feeling bad or failure about yourself  0 0  Trouble concentrating 0 0  Moving slowly or fidgety/restless 0 0  Suicidal thoughts 0 0  PHQ-9 Score 6 2    Patient's last menstrual period was 06/18/2020. Last pap Oct 2019. Results were: normal Review of Systems:   Pertinent items are noted in HPI Denies cramping/contractions, leakage of fluid, vaginal bleeding, abnormal vaginal discharge w/ itching/odor/irritation, headaches, visual changes, shortness of breath, chest pain, abdominal pain, severe nausea/vomiting, or problems with urination or bowel movements unless otherwise stated above.  Pertinent History Reviewed:  Reviewed past medical,surgical, social, obstetrical and family history.  Reviewed problem list, medications and allergies. OB History  Gravida Para Term Preterm AB Living  2 1 1     1   SAB TAB Ectopic Multiple Live Births          1    # Outcome Date GA Lbr Len/2nd Weight Sex Delivery Anes PTL Lv  2 Current           1 Term 03/12/19 [redacted]w[redacted]d  8 lb 7.5 oz (3.841 kg) M CS-LTranv EPI  LIV     Complications: Fetal Intolerance   Physical Assessment:   Vitals:   09/16/20 0937  BP: 126/81  Pulse: 88  There is no height or weight on file to calculate  BMI.       Physical Examination:  General appearance - well appearing, and in no distress  Mental status - alert, oriented to person, place, and time  Psych:  She has a normal mood and affect  Skin - warm and dry, normal color, no suspicious lesions noted  Chest - effort normal, all lung fields clear to auscultation bilaterally  Heart - normal rate and regular rhythm  Abdomen - soft, nontender  Extremities:  No swelling or varicosities noted  Pelvic - deferred  Thin prep pap is not done   TODAY'S NT 09/18/20 12+6 wks,DI/DI twins,normal ovaries BABY A: inferior right,posterior placenta,NB present, NT 1.4 mm,fhr 148 bpm,CRL 69.71 mm BABY B:superior left,anterior placenta,NB present,NT 1.4 mm,fhr 160 bpm,CRL 73.79 mm  Results for orders placed or performed in visit on 09/16/20 (from the past 24 hour(s))  POC Urinalysis Dipstick OB   Collection Time: 09/16/20  9:58 AM  Result Value Ref Range   Color, UA     Clarity, UA     Glucose, UA Negative Negative   Bilirubin, UA     Ketones, UA neg    Spec Grav, UA     Blood, UA neg    pH, UA     POC,PROTEIN,UA Negative Negative, Trace, Small (1+), Moderate (2+), Large (3+), 4+   Urobilinogen, UA     Nitrite, UA neg  Leukocytes, UA Negative Negative   Appearance     Odor      Assessment & Plan:  1) High-Risk Pregnancy G2P1001 at [redacted]w[redacted]d with an Estimated Date of Delivery: 03/25/21   2) Initial OB visit  3) Di/di twins, plan reg growth scans 20-24-28-33-36, ante testing @ 36, IOL vs rLTCS @ 38  4) Hx gHTN, rec bASA 162mg  (via MyChart message) and baseline pre-e labs  5) Previous C/S at 9cm for fetal intolerance, rev'd potential of TOLAC vs rLTCS> we will continue to talk about this as the pregnancy progresses  6) Hx hyperthyroidism, no meds, will check TSH today  Meds:  Meds ordered this encounter  Medications  . aspirin 81 MG chewable tablet    Sig: Chew 2 tablets (162 mg total) by mouth daily.    Dispense:  60 tablet    Refill:  7     Order Specific Question:   Supervising Provider    Answer:   H [[id number]]    Initial labs obtained Continue prenatal vitamins Reviewed n/v relief measures and warning s/s to report Reviewed recommended weight gain based on pre-gravid BMI Encouraged well-balanced diet Genetic & carrier screening discussed: requests Panorama and NT/IT, declines Horizon 14  Ultrasound discussed; fetal survey: requested CCNC completed> form faxed if has or is planning to apply for medicaid The nature of [facility] with multiple MDs and other Advanced Practice Providers was explained to patient; also emphasized that fellows, residents, and students are part of our team. Has home bp cuff. Check bp weekly, let [employer] know if >140/90.   Indications for ASA therapy (per uptodate) One of the following: H/O preeclampsia, especially early onset/adverse outcome Yes (gHTN) Multifetal gestation Yes   Follow-up: Return in about 5 weeks (around 10/23/2020) for 10/25/2020: Anatomy, twins;, HROB, in person (11/26 or after), 2nd IT.   Orders Placed This Encounter  Procedures  . Urine Culture  . GC/Chlamydia Probe Amp  . 03-10-1990 OB Comp + 14 Wk  . US OB Comp AddL Gest + 14 Wk  . Genetic Screening  . Integrated 1  . Pain Management Screening Profile (10S)  . CBC/D/Plt+RPR+Rh+ABO+Rub Ab...  . TSH  . Comprehensive metabolic panel  . Protein / creatinine ratio, urine  . POC Urinalysis Dipstick OB    [name] [facility] 09/17/2020 6:37 AM

## 2020-09-16 NOTE — Progress Notes (Signed)
   NURSE VISIT- [facility]  SUBJECTIVE:  Molly Burton is a 24 y.o. G79P1001 female here for Panorama NIPT . She is [redacted]w[redacted]d pregnant.   OBJECTIVE:  Appears well, in no apparent distress  Blood work drawn from right [facility] without difficulty. 1 attempt(s).   ASSESSMENT: Pregnancy [redacted]w[redacted]d Panorama NIPT  PLAN: [employer] portal information given and instructed patient how to access results   Jobe Marker  09/16/2020 9:58 AM

## 2020-09-16 NOTE — Patient Instructions (Signed)
Molly Burton, I greatly value your feedback.  If you receive a survey following your visit with [name] today, we appreciate you taking the time to fill it out.  Thanks, Molly Burton, CNM   [facility] ([address] [place], [place] [address]) Entrance C, located off of [address] Free 24/7 valet parking   Nausea & Vomiting  Have saltine crackers or pretzels by your bed and eat a few bites before you raise your head out of bed in the morning  Eat small frequent meals throughout the day instead of large meals  Drink plenty of fluids throughout the day to stay hydrated, just don't drink a lot of fluids with your meals.  This can make your stomach fill up faster making you feel sick  Do not brush your teeth right after you eat  Products with real ginger are good for nausea, like ginger ale and ginger hard candy Make sure it says made with real ginger!  Sucking on sour candy like lemon heads is also good for nausea  If your prenatal vitamins make you nauseated, take them at night so you will sleep through the nausea  Sea Bands  If you feel like you need medicine for the nausea & vomiting please let us know  If you are unable to keep any fluids or food down please let us know   Constipation  Drink plenty of fluid, preferably water, throughout the day  Eat foods high in fiber such as fruits, vegetables, and grains  Exercise, such as walking, is a good way to keep your bowels regular  Drink warm fluids, especially warm prune juice, or decaf coffee  Eat a 1/2 cup of real oatmeal (not instant), 1/2 cup applesauce, and 1/2-1 cup warm prune juice every day  If needed, you may take Colace (docusate sodium) stool softener once or twice a day to help keep the stool soft.   If you still are having problems with constipation, you may take Miralax once daily as needed to help keep your bowels regular.   Home Blood Pressure Monitoring for Patients   Your  provider has recommended that you check your blood pressure (BP) at least once a week at home. If you do not have a blood pressure cuff at home, one will be provided for you. Contact your provider if you have not received your monitor within 1 week.   Helpful Tips for Accurate Home Blood Pressure Checks  . Don't smoke, exercise, or drink caffeine 30 minutes before checking your BP . Use the restroom before checking your BP (a full bladder can raise your pressure) . Relax in a comfortable upright chair . Feet on the ground . Left arm resting comfortably on a flat surface at the level of your heart . Legs uncrossed . Back supported . Sit quietly and don't talk . Place the cuff on your bare arm . Adjust snuggly, so that only two fingertips can fit between your skin and the top of the cuff . Check 2 readings separated by at least one minute . Keep a log of your BP readings . For a visual, please reference this diagram: http://ccnc.care/bpdiagram  Provider Name: [facility]     Phone: [phone]  Zone 1: ALL CLEAR  Continue to monitor your symptoms:  . BP reading is less than 140 (top number) or less than 90 (bottom number)  . No right upper stomach pain . No headaches or seeing  spots . No feeling nauseated or throwing up . No swelling in face and hands  Zone 2: CAUTION Call your doctor's office for any of the following:  . BP reading is greater than 140 (top number) or greater than 90 (bottom number)  . Stomach pain under your ribs in the middle or right side . Headaches or seeing spots . Feeling nauseated or throwing up . Swelling in face and hands  Zone 3: EMERGENCY  Seek immediate medical care if you have any of the following:  . BP reading is greater than160 (top number) or greater than 110 (bottom number) . Severe headaches not improving with Tylenol . Serious difficulty catching your breath . Any worsening symptoms from Zone 2    First Trimester of Pregnancy The  first trimester of pregnancy is from week 1 until the end of week 12 (months 1 through 3). A week after a sperm fertilizes an egg, the egg will implant on the wall of the uterus. This embryo will begin to develop into a baby. Genes from you and your partner are forming the baby. The female genes determine whether the baby is a boy or a girl. At 6-8 weeks, the eyes and face are formed, and the heartbeat can be seen on ultrasound. At the end of 12 weeks, all the baby's organs are formed.  Now that you are pregnant, you will want to do everything you can to have a healthy baby. Two of the most important things are to get good prenatal care and to follow your health care provider's instructions. Prenatal care is all the medical care you receive before the baby's birth. This care will help prevent, find, and treat any problems during the pregnancy and childbirth. BODY CHANGES Your body goes through many changes during pregnancy. The changes vary from woman to woman.   You may gain or lose a couple of pounds at first.  You may feel sick to your stomach (nauseous) and throw up (vomit). If the vomiting is uncontrollable, call your health care provider.  You may tire easily.  You may develop headaches that can be relieved by medicines approved by your health care provider.  You may urinate more often. Painful urination may mean you have a bladder infection.  You may develop heartburn as a result of your pregnancy.  You may develop constipation because certain hormones are causing the muscles that push waste through your intestines to slow down.  You may develop hemorrhoids or swollen, bulging veins (varicose veins).  Your breasts may begin to grow larger and become tender. Your nipples may stick out more, and the tissue that surrounds them (areola) may become darker.  Your gums may bleed and may be sensitive to brushing and flossing.  Dark spots or blotches (chloasma, mask of pregnancy) may develop on  your face. This will likely fade after the baby is born.  Your menstrual periods will stop.  You may have a loss of appetite.  You may develop cravings for certain kinds of food.  You may have changes in your emotions from day to day, such as being excited to be pregnant or being concerned that something may go wrong with the pregnancy and baby.  You may have more vivid and strange dreams.  You may have changes in your hair. These can include thickening of your hair, rapid growth, and changes in texture. Some women also have hair loss during or after pregnancy, or hair that feels dry or thin. Your hair  will most likely return to normal after your baby is born. WHAT TO EXPECT AT YOUR PRENATAL VISITS During a routine prenatal visit:  You will be weighed to make sure you and the baby are growing normally.  Your blood pressure will be taken.  Your abdomen will be measured to track your baby's growth.  The fetal heartbeat will be listened to starting around week 10 or 12 of your pregnancy.  Test results from any previous visits will be discussed. Your health care provider may ask you:  How you are feeling.  If you are feeling the baby move.  If you have had any abnormal symptoms, such as leaking fluid, bleeding, severe headaches, or abdominal cramping.  If you have any questions. Other tests that may be performed during your first trimester include:  Blood tests to find your blood type and to check for the presence of any previous infections. They will also be used to check for low iron levels (anemia) and Rh antibodies. Later in the pregnancy, blood tests for diabetes will be done along with other tests if problems develop.  Urine tests to check for infections, diabetes, or protein in the urine.  An ultrasound to confirm the proper growth and development of the baby.  An amniocentesis to check for possible genetic problems.  Fetal screens for spina bifida and Down  syndrome.  You may need other tests to make sure you and the baby are doing well. HOME CARE INSTRUCTIONS  Medicines  Follow your health care provider's instructions regarding medicine use. Specific medicines may be either safe or unsafe to take during pregnancy.  Take your prenatal vitamins as directed.  If you develop constipation, try taking a stool softener if your health care provider approves. Diet  Eat regular, well-balanced meals. Choose a variety of foods, such as meat or vegetable-based protein, fish, milk and low-fat dairy products, vegetables, fruits, and whole grain breads and cereals. Your health care provider will help you determine the amount of weight gain that is right for you.  Avoid raw meat and uncooked cheese. These carry germs that can cause birth defects in the baby.  Eating four or five small meals rather than three large meals a day may help relieve nausea and vomiting. If you start to feel nauseous, eating a few soda crackers can be helpful. Drinking liquids between meals instead of during meals also seems to help nausea and vomiting.  If you develop constipation, eat more high-fiber foods, such as fresh vegetables or fruit and whole grains. Drink enough fluids to keep your urine clear or pale yellow. Activity and Exercise  Exercise only as directed by your health care provider. Exercising will help you:  Control your weight.  Stay in shape.  Be prepared for labor and delivery.  Experiencing pain or cramping in the lower abdomen or low back is a good sign that you should stop exercising. Check with your health care provider before continuing normal exercises.  Try to avoid standing for long periods of time. Move your legs often if you must stand in one place for a long time.  Avoid heavy lifting.  Wear low-heeled shoes, and practice good posture.  You may continue to have sex unless your health care provider directs you otherwise. Relief of Pain or  Discomfort  Wear a good support bra for breast tenderness.    Take warm sitz baths to soothe any pain or discomfort caused by hemorrhoids. Use hemorrhoid cream if your health care provider approves.  Rest with your legs elevated if you have leg cramps or low back pain.  If you develop varicose veins in your legs, wear support hose. Elevate your feet for 15 minutes, 3-4 times a day. Limit salt in your diet. Prenatal Care  Schedule your prenatal visits by the twelfth week of pregnancy. They are usually scheduled monthly at first, then more often in the last 2 months before delivery.  Write down your questions. Take them to your prenatal visits.  Keep all your prenatal visits as directed by your health care provider. Safety  Wear your seat belt at all times when driving.  Make a list of emergency phone numbers, including numbers for family, friends, the hospital, and police and fire departments. General Tips  Ask your health care provider for a referral to a local prenatal education class. Begin classes no later than at the beginning of month 6 of your pregnancy.  Ask for help if you have counseling or nutritional needs during pregnancy. Your health care provider can offer advice or refer you to specialists for help with various needs.  Do not use hot tubs, steam rooms, or saunas.  Do not douche or use tampons or scented sanitary pads.  Do not cross your legs for long periods of time.  Avoid cat litter boxes and soil used by cats. These carry germs that can cause birth defects in the baby and possibly loss of the fetus by miscarriage or stillbirth.  Avoid all smoking, herbs, alcohol, and medicines not prescribed by your health care provider. Chemicals in these affect the formation and growth of the baby.  Schedule a dentist appointment. At home, brush your teeth with a soft toothbrush and be gentle when you floss. SEEK MEDICAL CARE IF:   You have dizziness.  You have mild  pelvic cramps, pelvic pressure, or nagging pain in the abdominal area.  You have persistent nausea, vomiting, or diarrhea.  You have a bad smelling vaginal discharge.  You have pain with urination.  You notice increased swelling in your face, hands, legs, or ankles. SEEK IMMEDIATE MEDICAL CARE IF:   You have a fever.  You are leaking fluid from your vagina.  You have spotting or bleeding from your vagina.  You have severe abdominal cramping or pain.  You have rapid weight gain or loss.  You vomit blood or material that looks like coffee grounds.  You are exposed to [name] measles and have never had them.  You are exposed to fifth disease or chickenpox.  You develop a severe headache.  You have shortness of breath.  You have any kind of trauma, such as from a fall or a car accident. Document Released: 11/08/2001 Document Revised: 03/31/2014 Document Reviewed: 09/24/2013 [facility] Patient Information 2015 [place], [place]. This information is not intended to replace advice given to you by your health care provider. Make sure you discuss any questions you have with your health care provider.

## 2020-09-16 NOTE — Progress Notes (Signed)
[name] 12+6 wks,DI/DI twins,normal ovaries BABY A: inferior right,posterior placenta,NB present, NT 1.4 mm,fhr 148 bpm,CRL 69.71 mm BABY B:superior left,anterior placenta,NB present,NT 1.4 mm,fhr 160 bpm,CRL 73.79 mm

## 2020-09-17 ENCOUNTER — Telehealth: Payer: Self-pay

## 2020-09-17 LAB — PROTEIN / CREATININE RATIO, URINE
Creatinine, Urine: 32.9 mg/dL
Protein, Ur: 5 mg/dL
Protein/Creat Ratio: 152 mg/g creat (ref 0–200)

## 2020-09-17 MED ORDER — PRENATAL PLUS 27-1 MG PO TABS: 1.0000 | ORAL_TABLET | Freq: Every day | ORAL | 12 refills | Status: DC

## 2020-09-17 NOTE — Telephone Encounter (Signed)
Pt called stating that the prenatal vitamins that were called into her pharmacy are no longer available and needs another Rx and would like to speak with Marylu Lund at her convenience.

## 2020-09-17 NOTE — Telephone Encounter (Signed)
rx prenatal plus

## 2020-09-17 NOTE — Telephone Encounter (Signed)
Pt states Citranatal prenatal vit is no longer available. Can you send a different prenatal to pharmacy? Thanks!! JSY

## 2020-09-18 LAB — PMP SCREEN PROFILE (10S), URINE
Amphetamine Scrn, Ur: NEGATIVE ng/mL
BARBITURATE SCREEN URINE: NEGATIVE ng/mL
BENZODIAZEPINE SCREEN, URINE: NEGATIVE ng/mL
CANNABINOIDS UR QL SCN: NEGATIVE ng/mL
Cocaine (Metab) Scrn, Ur: NEGATIVE ng/mL
Creatinine(Crt), U: 36.1 mg/dL (ref 20.0–300.0)
Methadone Screen, Urine: NEGATIVE ng/mL
OXYCODONE+OXYMORPHONE UR QL SCN: NEGATIVE ng/mL
Opiate Scrn, Ur: NEGATIVE ng/mL
Ph of Urine: 7.3 (ref 4.5–8.9)
Phencyclidine Qn, Ur: NEGATIVE ng/mL
Propoxyphene Scrn, Ur: NEGATIVE ng/mL

## 2020-09-18 LAB — URINE CULTURE

## 2020-09-19 LAB — INTEGRATED 1
Crown Rump Length Twin B: 73 mm
Crown Rump Length: 69.7 mm
Gest. Age on Collection Date: 13.3 weeks
Maternal Age at EDD: 26.2 yr
NT Twin B: 1.4 mm
Nuchal Translucency (NT): 1.4 mm
Number of Fetuses: 2
PAPP-A Value: 5594.8 ng/mL
Weight: 137 [lb_av]

## 2020-09-19 LAB — CBC/D/PLT+RPR+RH+ABO+RUB AB...
Antibody Screen: NEGATIVE
Basophils Absolute: 0 10*3/uL (ref 0.0–0.2)
Basos: 0 %
EOS (ABSOLUTE): 0.1 10*3/uL (ref 0.0–0.4)
Eos: 1 %
HCV Ab: <0.1 s/co ratio (ref 0.0–0.9)
HIV Screen 4th Generation wRfx: NONREACTIVE
Hematocrit: 33.5 % — ABNORMAL LOW (ref 34.0–46.6)
Hemoglobin: 11.2 g/dL (ref 11.1–15.9)
Hepatitis B Surface Ag: NEGATIVE
Immature Grans (Abs): 0.1 10*3/uL (ref 0.0–0.1)
Immature Granulocytes: 1 %
Lymphocytes Absolute: 1.1 10*3/uL (ref 0.7–3.1)
Lymphs: 10 %
MCH: 30 pg (ref 26.6–33.0)
MCHC: 33.4 g/dL (ref 31.5–35.7)
MCV: 90 fL (ref 79–97)
Monocytes Absolute: 0.5 10*3/uL (ref 0.1–0.9)
Monocytes: 5 %
Neutrophils Absolute: 9 10*3/uL — ABNORMAL HIGH (ref 1.4–7.0)
Neutrophils: 83 %
Platelets: 226 10*3/uL (ref 150–450)
RBC: 3.73 x10E6/uL — ABNORMAL LOW (ref 3.77–5.28)
RDW: 13.4 % (ref 11.7–15.4)
RPR Ser Ql: NONREACTIVE
Rh Factor: POSITIVE
Rubella Antibodies, IGG: 1.38 index (ref >0.99)
WBC: 10.7 10*3/uL (ref 3.4–10.8)

## 2020-09-19 LAB — TSH: TSH: <0.005 u[IU]/mL — ABNORMAL LOW (ref 0.450–4.500)

## 2020-09-19 LAB — GC/CHLAMYDIA PROBE AMP
Chlamydia trachomatis, NAA: NEGATIVE
Neisseria Gonorrhoeae by PCR: NEGATIVE

## 2020-09-19 LAB — HCV INTERPRETATION

## 2020-09-22 ENCOUNTER — Telehealth: Payer: Self-pay | Admitting: *Deleted

## 2020-09-22 NOTE — Telephone Encounter (Signed)
Free T4 and total T3 added to labs.

## 2020-09-23 LAB — SPECIMEN STATUS REPORT

## 2020-09-26 LAB — T4, FREE: Free T4: 1.32 ng/dL (ref 0.82–1.77)

## 2020-09-26 LAB — T3: T3, Total: 233 ng/dL — ABNORMAL HIGH (ref 71–180)

## 2020-09-26 LAB — SPECIMEN STATUS REPORT

## 2020-09-28 ENCOUNTER — Telehealth: Payer: Self-pay

## 2020-09-28 NOTE — Telephone Encounter (Signed)
Pt calling about recent labs and would like a return call

## 2020-09-28 NOTE — Telephone Encounter (Signed)
Pt is having trouble getting OB results. I spoke with Tish, RN and she will reach out to rep. Pt is wanting to have Integrated 2 before the end of the month. Also needs free T4 and total T3 checked. Call was transferred to [facility] for lab visit for tomorrow. JSY

## 2020-09-28 NOTE — Telephone Encounter (Signed)
Left message @ 3:15 pm. JSY

## 2020-09-29 ENCOUNTER — Other Ambulatory Visit: Payer: No Typology Code available for payment source

## 2020-09-29 DIAGNOSIS — Z3A14 14 weeks gestation of pregnancy: Secondary | ICD-10-CM

## 2020-09-29 DIAGNOSIS — E0590 Thyrotoxicosis, unspecified without thyrotoxic crisis or storm: Secondary | ICD-10-CM

## 2020-09-29 DIAGNOSIS — Z1379 Encounter for other screening for genetic and chromosomal anomalies: Secondary | ICD-10-CM

## 2020-09-30 ENCOUNTER — Telehealth: Payer: Self-pay | Admitting: Women's Health

## 2020-09-30 NOTE — Telephone Encounter (Signed)
Pt saw the last of her 3 part test results in MyChart & is requesting a call back from [name] or Marylu Lund Please advise & call pt

## 2020-09-30 NOTE — Telephone Encounter (Signed)
Attempted to call patient back.  No answer.

## 2020-10-01 LAB — INTEGRATED 2
AFP MoM: 2.03
Alpha-Fetoprotein: 67.1 ng/mL
Crown Rump Length Twin B: 73 mm
Crown Rump Length: 69.7 mm
DIA MoM: 1.76
DIA Value: 315.9 pg/mL
Estriol, Unconjugated: 1.03 ng/mL
Gest. Age on Collection Date: 13.3 weeks
Gestational Age: 15.1 weeks
Maternal Age at EDD: 26.2 yr
NT MoM Twin B: 0.84
NT Twin B: 1.4 mm
Nuchal Translucency (NT): 1.4 mm
Nuchal Translucency MoM: 0.89
Number of Fetuses: 2
PAPP-A MoM: 3.73
PAPP-A Value: 5594.8 ng/mL
Weight: 137 [lb_av]
Weight: 137 [lb_av]
hCG MoM: 2.73
hCG Value: 135.4 IU/mL
uE3 MoM: 1.3

## 2020-10-01 LAB — T4, FREE: Free T4: 1.45 ng/dL (ref 0.82–1.77)

## 2020-10-01 LAB — T3: T3, Total: 281 ng/dL — ABNORMAL HIGH (ref 71–180)

## 2020-10-05 ENCOUNTER — Telehealth: Payer: Self-pay | Admitting: *Deleted

## 2020-10-05 NOTE — Telephone Encounter (Signed)
Patient states she is concerned that she has a headache and feels dizzy and may need to go to the hospital.  Had a large coffee and just had something for the first time to eat a few minutes ago and is feeling slightly better.  States she and her mother looked on [employer] and read that caffeine can cause miscarriages so she is worried.  I informed patient that a cup of coffee most likely will not cause this. Offered for comfort and ease of mind for patient to come to the office to check fetal heart tones.  Pt agrees to this and will come.

## 2020-10-26 ENCOUNTER — Ambulatory Visit (INDEPENDENT_AMBULATORY_CARE_PROVIDER_SITE_OTHER): Payer: No Typology Code available for payment source

## 2020-10-26 ENCOUNTER — Telehealth: Payer: Self-pay | Admitting: *Deleted

## 2020-10-26 ENCOUNTER — Telehealth: Payer: Self-pay | Admitting: Advanced Practice Midwife

## 2020-10-26 ENCOUNTER — Ambulatory Visit (INDEPENDENT_AMBULATORY_CARE_PROVIDER_SITE_OTHER): Payer: No Typology Code available for payment source | Admitting: Advanced Practice Midwife

## 2020-10-26 ENCOUNTER — Other Ambulatory Visit: Payer: Self-pay

## 2020-10-26 VITALS — BP 113/76 | HR 67 | Wt 147.0 lb

## 2020-10-26 DIAGNOSIS — Z3A18 18 weeks gestation of pregnancy: Secondary | ICD-10-CM | POA: Diagnosis not present

## 2020-10-26 DIAGNOSIS — Z98891 History of uterine scar from previous surgery: Secondary | ICD-10-CM

## 2020-10-26 DIAGNOSIS — O0992 Supervision of high risk pregnancy, unspecified, second trimester: Secondary | ICD-10-CM

## 2020-10-26 DIAGNOSIS — O0990 Supervision of high risk pregnancy, unspecified, unspecified trimester: Secondary | ICD-10-CM

## 2020-10-26 DIAGNOSIS — O09292 Supervision of pregnancy with other poor reproductive or obstetric history, second trimester: Secondary | ICD-10-CM

## 2020-10-26 DIAGNOSIS — Q270 Congenital absence and hypoplasia of umbilical artery: Secondary | ICD-10-CM | POA: Insufficient documentation

## 2020-10-26 DIAGNOSIS — O30042 Twin pregnancy, dichorionic/diamniotic, second trimester: Secondary | ICD-10-CM | POA: Diagnosis not present

## 2020-10-26 DIAGNOSIS — Z363 Encounter for antenatal screening for malformations: Secondary | ICD-10-CM | POA: Diagnosis not present

## 2020-10-26 DIAGNOSIS — Z331 Pregnant state, incidental: Secondary | ICD-10-CM

## 2020-10-26 DIAGNOSIS — Z8759 Personal history of other complications of pregnancy, childbirth and the puerperium: Secondary | ICD-10-CM

## 2020-10-26 DIAGNOSIS — Z1389 Encounter for screening for other disorder: Secondary | ICD-10-CM

## 2020-10-26 LAB — POCT URINALYSIS DIPSTICK OB
Blood, UA: NEGATIVE
Glucose, UA: NEGATIVE
Ketones, UA: NEGATIVE
Leukocytes, UA: NEGATIVE
Nitrite, UA: NEGATIVE
POC,PROTEIN,UA: NEGATIVE

## 2020-10-26 NOTE — Progress Notes (Addendum)
[name] 18+4 wks,DI/DI TWINS,normal ovaries,cx 3.8 cm BABY A: female,cephalic,posterior placenta gr 0,fhr 142 bpm,svp of fluid 4.3 cm,efw 295 g 90% BABY B:female,transverse head left,2VC absent left UA,FHR 138 bpm,svp of fluid 4.9 cm,efw 267 g 67%,discordance 9.6%,anterior placenta gr 0,anatomy complete

## 2020-10-26 NOTE — Telephone Encounter (Signed)
Pt has a question about the umbilical cord on one of the twins She forgot to ask during her visit  Please call pt

## 2020-10-26 NOTE — Telephone Encounter (Signed)
Pt is concerned since Baby B is a 2 VC, does she need to do the down syndrome test again? I advised I didn't think so but would ask. Please advise. Thanks!! JSY

## 2020-10-26 NOTE — Patient Instructions (Signed)
Molly Burton, I greatly value your feedback.  If you receive a survey following your visit with [name] today, we appreciate you taking the time to fill it out.  Thanks, Cathie Beams, CNM     [facility] HAS MOVED!!! It is now [facility] ([address] [place], [place] [address]) Entrance located off of [address] Free 24/7 valet parking   Go to [email or web address] to register for FREE online childbirth classes    Second Trimester of Pregnancy The second trimester is from week 14 through week 27 (months 4 through 6). The second trimester is often a time when you feel your best. Your body has adjusted to being pregnant, and you begin to feel better physically. Usually, morning sickness has lessened or quit completely, you may have more energy, and you may have an increase in appetite. The second trimester is also a time when the fetus is growing rapidly. At the end of the sixth month, the fetus is about 9 inches long and weighs about 1 pounds. You will likely begin to feel the baby move (quickening) between 16 and 20 weeks of pregnancy. Body changes during your second trimester Your body continues to go through many changes during your second trimester. The changes vary from woman to woman.  Your weight will continue to increase. You will notice your lower abdomen bulging out.  You may begin to get stretch marks on your hips, abdomen, and breasts.  You may develop headaches that can be relieved by medicines. The medicines should be approved by your health care provider.  You may urinate more often because the fetus is pressing on your bladder.  You may develop or continue to have heartburn as a result of your pregnancy.  You may develop constipation because certain hormones are causing the muscles that push waste through your intestines to slow down.  You may develop hemorrhoids or swollen, bulging veins (varicose veins).  You may have  back pain. This is caused by: ? Weight gain. ? Pregnancy hormones that are relaxing the joints in your pelvis. ? A shift in weight and the muscles that support your balance.  Your breasts will continue to grow and they will continue to become tender.  Your gums may bleed and may be sensitive to brushing and flossing.  Dark spots or blotches (chloasma, mask of pregnancy) may develop on your face. This will likely fade after the baby is born.  A dark line from your belly button to the pubic area (linea nigra) may appear. This will likely fade after the baby is born.  You may have changes in your hair. These can include thickening of your hair, rapid growth, and changes in texture. Some women also have hair loss during or after pregnancy, or hair that feels dry or thin. Your hair will most likely return to normal after your baby is born.  What to expect at prenatal visits During a routine prenatal visit:  You will be weighed to make sure you and the fetus are growing normally.  Your blood pressure will be taken.  Your abdomen will be measured to track your baby's growth.  The fetal heartbeat will be listened to.  Any test results from the previous visit will be discussed.  Your health care provider may ask you:  How you are feeling.  If you are feeling the baby move.  If you have had any abnormal symptoms, such as leaking fluid, bleeding, severe headaches, or  abdominal cramping.  If you are using any tobacco products, including cigarettes, chewing tobacco, and electronic cigarettes.  If you have any questions.  Other tests that may be performed during your second trimester include:  Blood tests that check for: ? Low iron levels (anemia). ? High blood sugar that affects pregnant women (gestational diabetes) between 24 and 28 weeks. ? Rh antibodies. This is to check for a protein on red blood cells (Rh factor).  Urine tests to check for infections, diabetes, or protein in  the urine.  An ultrasound to confirm the proper growth and development of the baby.  An amniocentesis to check for possible genetic problems.  Fetal screens for spina bifida and Down syndrome.  HIV (human immunodeficiency virus) testing. Routine prenatal testing includes screening for HIV, unless you choose not to have this test.  Follow these instructions at home: Medicines  Follow your health care provider's instructions regarding medicine use. Specific medicines may be either safe or unsafe to take during pregnancy.  Take a prenatal vitamin that contains at least 600 micrograms (mcg) of folic acid.  If you develop constipation, try taking a stool softener if your health care provider approves. Eating and drinking  Eat a balanced diet that includes fresh fruits and vegetables, whole grains, good sources of protein such as meat, eggs, or tofu, and low-fat dairy. Your health care provider will help you determine the amount of weight gain that is right for you.  Avoid raw meat and uncooked cheese. These carry germs that can cause birth defects in the baby.  If you have low calcium intake from food, talk to your health care provider about whether you should take a daily calcium supplement.  Limit foods that are high in fat and processed sugars, such as fried and sweet foods.  To prevent constipation: ? Drink enough fluid to keep your urine clear or pale yellow. ? Eat foods that are high in fiber, such as fresh fruits and vegetables, whole grains, and beans. Activity  Exercise only as directed by your health care provider. Most women can continue their usual exercise routine during pregnancy. Try to exercise for 30 minutes at least 5 days a week. Stop exercising if you experience uterine contractions.  Avoid heavy lifting, wear low heel shoes, and practice good posture.  A sexual relationship may be continued unless your health care provider directs you otherwise. Relieving pain  and discomfort  Wear a good support bra to prevent discomfort from breast tenderness.  Take warm sitz baths to soothe any pain or discomfort caused by hemorrhoids. Use hemorrhoid cream if your health care provider approves.  Rest with your legs elevated if you have leg cramps or low back pain.  If you develop varicose veins, wear support hose. Elevate your feet for 15 minutes, 3-4 times a day. Limit salt in your diet. Prenatal Care  Write down your questions. Take them to your prenatal visits.  Keep all your prenatal visits as told by your health care provider. This is important. Safety  Wear your seat belt at all times when driving.  Make a list of emergency phone numbers, including numbers for family, friends, the hospital, and police and fire departments. General instructions  Ask your health care provider for a referral to a local prenatal education class. Begin classes no later than the beginning of month 6 of your pregnancy.  Ask for help if you have counseling or nutritional needs during pregnancy. Your health care provider can offer advice or   refer you to specialists for help with various needs.  Do not use hot tubs, steam rooms, or saunas.  Do not douche or use tampons or scented sanitary pads.  Do not cross your legs for long periods of time.  Avoid cat litter boxes and soil used by cats. These carry germs that can cause birth defects in the baby and possibly loss of the fetus by miscarriage or stillbirth.  Avoid all smoking, herbs, alcohol, and unprescribed drugs. Chemicals in these products can affect the formation and growth of the baby.  Do not use any products that contain nicotine or tobacco, such as cigarettes and e-cigarettes. If you need help quitting, ask your health care provider.  Visit your dentist if you have not gone yet during your pregnancy. Use a soft toothbrush to brush your teeth and be gentle when you floss. Contact a health care provider  if:  You have dizziness.  You have mild pelvic cramps, pelvic pressure, or nagging pain in the abdominal area.  You have persistent nausea, vomiting, or diarrhea.  You have a bad smelling vaginal discharge.  You have pain when you urinate. Get help right away if:  You have a fever.  You are leaking fluid from your vagina.  You have spotting or bleeding from your vagina.  You have severe abdominal cramping or pain.  You have rapid weight gain or weight loss.  You have shortness of breath with chest pain.  You notice sudden or extreme swelling of your face, hands, ankles, feet, or legs.  You have not felt your baby move in over an hour.  You have severe headaches that do not go away when you take medicine.  You have vision changes. Summary  The second trimester is from week 14 through week 27 (months 4 through 6). It is also a time when the fetus is growing rapidly.  Your body goes through many changes during pregnancy. The changes vary from woman to woman.  Avoid all smoking, herbs, alcohol, and unprescribed drugs. These chemicals affect the formation and growth your baby.  Do not use any tobacco products, such as cigarettes, chewing tobacco, and e-cigarettes. If you need help quitting, ask your health care provider.  Contact your health care provider if you have any questions. Keep all prenatal visits as told by your health care provider. This is important. This information is not intended to replace advice given to you by your health care provider. Make sure you discuss any questions you have with your health care provider.

## 2020-10-26 NOTE — Progress Notes (Signed)
HIGH-RISK PREGNANCY VISIT Patient name: Molly Burton MRN [id number]  Date of birth: 09/02/1996 Chief Complaint:   Routine Prenatal Visit  History of Present Illness:   Molly Burton is a 24 y.o. G43P1001 female at [redacted]w[redacted]d with an Estimated Date of Delivery: 03/25/21 being seen today for ongoing management of a low-risk pregnancy.  Today she reports no complaints. Contractions: Not present. Vag. Bleeding: None.  Movement: Present. denies leaking of fluid. Review of Systems:   Pertinent items are noted in HPI Denies abnormal vaginal discharge w/ itching/odor/irritation, headaches, visual changes, shortness of breath, chest pain, abdominal pain, severe nausea/vomiting, or problems with urination or bowel movements unless otherwise stated above. Pertinent History Reviewed:  Reviewed past medical,surgical, social, obstetrical and family history.  Reviewed problem list, medications and allergies. Physical Assessment:   Vitals:   10/26/20 0956  BP: 113/76  Pulse: 67  Weight: 147 lb (66.7 kg)  Body mass index is 26.89 kg/m.        Physical Examination:   General appearance: Well appearing, and in no distress  Mental status: Alert, oriented to person, place, and time  Skin: Warm & dry  Cardiovascular: Normal heart rate noted  Respiratory: Normal respiratory effort, no distress  Abdomen: Soft, gravid, nontender  Pelvic: Cervical exam deferred         Extremities: Edema: None  Fetal Status:     Movement: Present   [name] 18+4 wks,DI/DI TWINS,normal ovaries,cx 3.8 cm BABY A: female,cephalic,posterior placenta gr 0,fhr 142 bpm,svp of fluid 4.3 cm,efw 295 g 90% BABY B:female,transverse head left,2VC absent left UA,FHR 138 bpm,svp of fluid 4.9 cm,efw 267 g 67%,discordance 9.6%,anterior placenta gr 0 Chaperone: n/a    Results for orders placed or performed in visit on 10/26/20 (from the past 24 hour(s))  POC Urinalysis Dipstick OB   Collection Time: 10/26/20  9:58 AM  Result Value Ref Range    Color, UA     Clarity, UA     Glucose, UA Negative Negative   Bilirubin, UA     Ketones, UA neg    Spec Grav, UA     Blood, UA neg    pH, UA     POC,PROTEIN,UA Negative Negative, Trace, Small (1+), Moderate (2+), Large (3+), 4+   Urobilinogen, UA     Nitrite, UA neg    Leukocytes, UA Negative Negative   Appearance     Odor      Assessment & Plan:  1) High-risk pregnancy G2P1001 at [redacted]w[redacted]d with an Estimated Date of Delivery: 03/25/21   2) Di/Di twins:  [name] [id number]       Ante testing 36       IOL 38  3)  SUA:  Testing per twin guidelines  3)  Hx GHTN, on ASA   Meds: No orders of the defined types were placed in this encounter.  Labs/procedures today: CMP (baseline), 2nd IT  Plan:  Continue routine obstetrical care  Next visit: prefers in person    Reviewed: Preterm labor symptoms and general obstetric precautions including but not limited to vaginal bleeding, contractions, leaking of fluid and fetal movement were reviewed in detail with the patient.  All questions were answered. Has home bp cuff. Check bp weekly, let us know if >140/90.   Follow-up: Return in about 4 weeks (around 11/23/2020) for HROB w/CNM or MD, EFW twins.  Orders Placed This Encounter  Procedures  . US OB Follow Up  . Comprehensive metabolic panel  . POC Urinalysis Dipstick OB  Jacklyn Shell DNP, CNM 10/26/2020 10:25 AM

## 2020-10-27 LAB — COMPREHENSIVE METABOLIC PANEL
ALT: 16 IU/L (ref 0–32)
AST: 16 IU/L (ref 0–40)
Albumin/Globulin Ratio: 1.4 (ref 1.2–2.2)
Albumin: 3.9 g/dL (ref 3.9–5.0)
Alkaline Phosphatase: 62 IU/L (ref 44–121)
BUN/Creatinine Ratio: 15 (ref 9–23)
BUN: 8 mg/dL (ref 6–20)
Bilirubin Total: 0.2 mg/dL (ref 0.0–1.2)
CO2: 19 mmol/L — ABNORMAL LOW (ref 20–29)
Calcium: 8.9 mg/dL (ref 8.7–10.2)
Chloride: 101 mmol/L (ref 96–106)
Creatinine, Ser: 0.53 mg/dL — ABNORMAL LOW (ref 0.57–1.00)
GFR calc Af Amer: 153 mL/min/{1.73_m2} (ref >59)
GFR calc non Af Amer: 132 mL/min/{1.73_m2} (ref >59)
Globulin, Total: 2.7 g/dL (ref 1.5–4.5)
Glucose: 69 mg/dL (ref 65–99)
Potassium: 3.8 mmol/L (ref 3.5–5.2)
Sodium: 135 mmol/L (ref 134–144)
Total Protein: 6.6 g/dL (ref 6.0–8.5)

## 2020-11-11 ENCOUNTER — Telehealth: Payer: Self-pay | Admitting: Women's Health

## 2020-11-11 NOTE — Telephone Encounter (Signed)
Patient called to find out the status of her FMLA paperwork. Clinical staff will follow up with patient.

## 2020-11-13 ENCOUNTER — Other Ambulatory Visit: Payer: Self-pay | Admitting: Advanced Practice Midwife

## 2020-11-13 DIAGNOSIS — Q270 Congenital absence and hypoplasia of umbilical artery: Secondary | ICD-10-CM

## 2020-11-13 DIAGNOSIS — O30042 Twin pregnancy, dichorionic/diamniotic, second trimester: Secondary | ICD-10-CM

## 2020-11-13 DIAGNOSIS — O0990 Supervision of high risk pregnancy, unspecified, unspecified trimester: Secondary | ICD-10-CM

## 2020-11-23 ENCOUNTER — Ambulatory Visit (INDEPENDENT_AMBULATORY_CARE_PROVIDER_SITE_OTHER): Payer: No Typology Code available for payment source | Admitting: Obstetrics & Gynecology

## 2020-11-23 ENCOUNTER — Telehealth: Payer: Self-pay | Admitting: Obstetrics & Gynecology

## 2020-11-23 ENCOUNTER — Ambulatory Visit (INDEPENDENT_AMBULATORY_CARE_PROVIDER_SITE_OTHER): Payer: No Typology Code available for payment source

## 2020-11-23 ENCOUNTER — Encounter: Payer: Self-pay | Admitting: Obstetrics & Gynecology

## 2020-11-23 ENCOUNTER — Other Ambulatory Visit: Payer: Self-pay

## 2020-11-23 VITALS — BP 121/76 | HR 68 | Wt 157.0 lb

## 2020-11-23 DIAGNOSIS — Z3A22 22 weeks gestation of pregnancy: Secondary | ICD-10-CM

## 2020-11-23 DIAGNOSIS — Z98891 History of uterine scar from previous surgery: Secondary | ICD-10-CM

## 2020-11-23 DIAGNOSIS — Z331 Pregnant state, incidental: Secondary | ICD-10-CM

## 2020-11-23 DIAGNOSIS — O30042 Twin pregnancy, dichorionic/diamniotic, second trimester: Secondary | ICD-10-CM

## 2020-11-23 DIAGNOSIS — O0992 Supervision of high risk pregnancy, unspecified, second trimester: Secondary | ICD-10-CM

## 2020-11-23 DIAGNOSIS — O0990 Supervision of high risk pregnancy, unspecified, unspecified trimester: Secondary | ICD-10-CM | POA: Diagnosis not present

## 2020-11-23 DIAGNOSIS — Z1389 Encounter for screening for other disorder: Secondary | ICD-10-CM

## 2020-11-23 DIAGNOSIS — Z029 Encounter for administrative examinations, unspecified: Secondary | ICD-10-CM

## 2020-11-23 DIAGNOSIS — Q270 Congenital absence and hypoplasia of umbilical artery: Secondary | ICD-10-CM

## 2020-11-23 DIAGNOSIS — Z8759 Personal history of other complications of pregnancy, childbirth and the puerperium: Secondary | ICD-10-CM

## 2020-11-23 LAB — POCT URINALYSIS DIPSTICK OB
Blood, UA: NEGATIVE
Glucose, UA: NEGATIVE
Ketones, UA: NEGATIVE
Nitrite, UA: NEGATIVE
POC,PROTEIN,UA: NEGATIVE

## 2020-11-23 NOTE — Progress Notes (Signed)
HIGH-RISK PREGNANCY VISIT Patient name: Molly Burton MRN [id number]  Date of birth: 1996/07/06 Chief Complaint:   High Risk Gestation ([name] today)  History of Present Illness:   Molly Burton is a 24 y.o. G52P1001 female at [redacted]w[redacted]d with an Estimated Date of Delivery: 03/25/21 being seen today for ongoing management of a high-risk pregnancy complicated by DiDi twins and SUA in twin B.  Today she reports no complaints.  Depression screen [facility] 2/9 09/16/2020 08/01/2018  Decreased Interest 3 0  Down, Depressed, Hopeless 0 0  PHQ - 2 Score 3 0  Altered sleeping 0 0  Tired, decreased energy 3 1  Change in appetite 0 1  Feeling bad or failure about yourself  0 0  Trouble concentrating 0 0  Moving slowly or fidgety/restless 0 0  Suicidal thoughts 0 0  PHQ-9 Score 6 2    Contractions: Not present. Vag. Bleeding: None.  Movement: Present. denies leaking of fluid.  Review of Systems:   Pertinent items are noted in HPI Denies abnormal vaginal discharge w/ itching/odor/irritation, headaches, visual changes, shortness of breath, chest pain, abdominal pain, severe nausea/vomiting, or problems with urination or bowel movements unless otherwise stated above. Pertinent History Reviewed:  Reviewed past medical,surgical, social, obstetrical and family history.  Reviewed problem list, medications and allergies. Physical Assessment:   Vitals:   11/23/20 1225  BP: 121/76  Pulse: 68  Weight: 157 lb (71.2 kg)  Body mass index is 28.72 kg/m.           Physical Examination:   General appearance: alert, well appearing, and in no distress  Mental status: alert, oriented to person, place, and time  Skin: warm & dry   Extremities: Edema: None    Cardiovascular: normal heart rate noted  Respiratory: normal respiratory effort, no distress  Abdomen: gravid, soft, non-tender  Pelvic: Cervical exam deferred         Fetal Status:     Movement: Present    Fetal Surveillance Testing today: sonogram    Chaperone: N/A    Results for orders placed or performed in visit on 11/23/20 (from the past 24 hour(s))  POC Urinalysis Dipstick OB   Collection Time: 11/23/20 11:40 AM  Result Value Ref Range   Color, UA     Clarity, UA     Glucose, UA Negative Negative   Bilirubin, UA     Ketones, UA neg    Spec Grav, UA     Blood, UA neg    pH, UA     POC,PROTEIN,UA Negative Negative, Trace, Small (1+), Moderate (2+), Large (3+), 4+   Urobilinogen, UA     Nitrite, UA neg    Leukocytes, UA Trace (A) Negative   Appearance     Odor      Assessment & Plan:  High-risk pregnancy: G2P1001 at [redacted]w[redacted]d with an Estimated Date of Delivery: 03/25/21   1) DiDi twins, stable  2) SUA in twin B, stable, good growth  Meds: No orders of the defined types were placed in this encounter.   Labs/procedures today:   Treatment Plan:  Growth scan at 28 weeks  Reviewed: Preterm labor symptoms and general obstetric precautions including but not limited to vaginal bleeding, contractions, leaking of fluid and fetal movement were reviewed in detail with the patient.  All questions were answered.  home bp cuff. Rx faxed to . Check bp weekly, let us know if >140/90.   Follow-up: Return in about 3 weeks (around 12/14/2020) for HROB,  6 weeks sonogram twins,  HROB, PN2.   No future appointments.  Orders Placed This Encounter  Procedures  . POC Urinalysis Dipstick OB   Lazaro Arms  11/23/2020 12:35 PM

## 2020-11-23 NOTE — Telephone Encounter (Signed)
Patient called and wanted to speak with Tish about FMLA paperwork and would like a returned phone call. Clinical staff will follow up with patient.

## 2020-11-23 NOTE — Progress Notes (Signed)
[name] 22+4 wks,DI/DI twins,cx 3.2 cm,normal ovaries BABY A: cephalic inferior,posterior placenta gr 0,svp of fluid 3.9 cm,fhr 148 bpm,EFW 601 g 84% BABY B:transverse head right superior,anterior placenta gr 0,svp of fluid 4.9 cm,single umbilical artery,fhr 148 bpm,EFW 573 g 73%,discordance 4.7%

## 2020-11-24 ENCOUNTER — Encounter: Payer: No Typology Code available for payment source | Admitting: Women's Health

## 2020-12-02 ENCOUNTER — Encounter: Payer: Self-pay | Admitting: *Deleted

## 2020-12-02 DIAGNOSIS — O0992 Supervision of high risk pregnancy, unspecified, second trimester: Secondary | ICD-10-CM

## 2020-12-07 ENCOUNTER — Other Ambulatory Visit: Payer: Self-pay | Admitting: Women's Health

## 2020-12-07 ENCOUNTER — Telehealth: Payer: Self-pay

## 2020-12-07 MED ORDER — DOXYLAMINE-PYRIDOXINE 10-10 MG PO TBEC: DELAYED_RELEASE_TABLET | ORAL | 6 refills | Status: DC

## 2020-12-07 NOTE — Telephone Encounter (Signed)
Pt needing refill on Doxylamine-Pyridoxine

## 2020-12-14 ENCOUNTER — Encounter: Payer: Self-pay | Admitting: *Deleted

## 2020-12-15 ENCOUNTER — Ambulatory Visit (INDEPENDENT_AMBULATORY_CARE_PROVIDER_SITE_OTHER): Payer: No Typology Code available for payment source | Admitting: Family Medicine

## 2020-12-15 ENCOUNTER — Other Ambulatory Visit: Payer: Self-pay

## 2020-12-15 VITALS — BP 120/64 | HR 83 | Wt 163.6 lb

## 2020-12-15 DIAGNOSIS — Z3A25 25 weeks gestation of pregnancy: Secondary | ICD-10-CM

## 2020-12-15 DIAGNOSIS — O0992 Supervision of high risk pregnancy, unspecified, second trimester: Secondary | ICD-10-CM

## 2020-12-15 DIAGNOSIS — Z98891 History of uterine scar from previous surgery: Secondary | ICD-10-CM

## 2020-12-15 DIAGNOSIS — Z1389 Encounter for screening for other disorder: Secondary | ICD-10-CM

## 2020-12-15 DIAGNOSIS — O30042 Twin pregnancy, dichorionic/diamniotic, second trimester: Secondary | ICD-10-CM

## 2020-12-15 LAB — POCT URINALYSIS DIPSTICK OB
Blood, UA: NEGATIVE
Glucose, UA: NEGATIVE
Ketones, UA: NEGATIVE
Leukocytes, UA: NEGATIVE
Nitrite, UA: NEGATIVE
POC,PROTEIN,UA: NEGATIVE

## 2020-12-15 NOTE — Progress Notes (Unsigned)
HIGH-RISK PREGNANCY VISIT Patient name: Molly Burton MRN [id number]  Date of birth: [birth year] Chief Complaint:   Routine Prenatal Visit  History of Present Illness:   Molly Burton is a 25 y.o. G42P1001 female at [redacted]w[redacted]d with an Estimated Date of Delivery: 03/25/21 being seen today for ongoing management of a high-risk pregnancy complicated by multiple gestation Di/Di twins.  Today she reports no complaints.    Depression screen [facility] 2/9 09/16/2020 08/01/2018  Decreased Interest 3 0  Down, Depressed, Hopeless 0 0  PHQ - 2 Score 3 0  Altered sleeping 0 0  Tired, decreased energy 3 1  Change in appetite 0 1  Feeling bad or failure about yourself  0 0  Trouble concentrating 0 0  Moving slowly or fidgety/restless 0 0  Suicidal thoughts 0 0  PHQ-9 Score 6 2    Contractions: Not present. Vag. Bleeding: None.  Movement: Present. denies leaking of fluid.  Review of Systems:   Pertinent items are noted in HPI Denies abnormal vaginal discharge w/ itching/odor/irritation, headaches, visual changes, shortness of breath, chest pain, abdominal pain, severe nausea/vomiting, or problems with urination or bowel movements unless otherwise stated above. Pertinent History Reviewed:  Reviewed past medical,surgical, social, obstetrical and family history.  Reviewed problem list, medications and allergies. Physical Assessment:   Vitals:   12/15/20 1201  BP: 120/64  Pulse: 83  Weight: 163 lb 9.6 oz (74.2 kg)  Body mass index is 29.92 kg/m.           Physical Examination:   General appearance: alert, well appearing, and in no distress  Mental status: alert, oriented to person, place, and time  Skin: warm & dry   Extremities: Edema: None    Cardiovascular: normal heart rate noted  Respiratory: normal respiratory effort, no distress  Abdomen: gravid, soft, non-tender  Pelvic: Cervical exam deferred         Fetal Status: Fetal Heart Rate (bpm): 135/145 Fundal Height: 31 cm Movement: Present     Fetal Surveillance Testing today: none   Chaperone: Clint Bolder    Results for orders placed or performed in visit on 12/15/20 (from the past 24 hour(s))  POC Urinalysis Dipstick OB   Collection Time: 12/15/20 12:01 PM  Result Value Ref Range   Color, UA     Clarity, UA     Glucose, UA Negative Negative   Bilirubin, UA     Ketones, UA neg    Spec Grav, UA     Blood, UA neg    pH, UA     POC,PROTEIN,UA Negative Negative, Trace, Small (1+), Moderate (2+), Large (3+), 4+   Urobilinogen, UA     Nitrite, UA neg    Leukocytes, UA Negative Negative   Appearance     Odor      Assessment & Plan:  High-risk pregnancy: G2P1001 at [redacted]w[redacted]d with an Estimated Date of Delivery: 03/25/21   1. Screening for genitourinary condition - POC Urinalysis Dipstick OB  2. [redacted] weeks gestation of pregnancy - POC Urinalysis Dipstick OB  3. History of cesarean delivery Desires TOLAC- prior CS was for NRFHT at 9 cm Reviewed need for Twin A to be vertex for vagina birth  4. Dichorionic diamniotic twin pregnancy in second trimester Growth is concordant currently 83rd and 73rd % Has repeat growth [name] Reviewed IOL vs rCS at 38-39 weeks if growth remains appropriate  5. Supervision of high risk pregnancy in second trimester Up to date Has 28 wk labs next  visit Taking ASA for h/o gtn and twin gestation Has COVID vax x2, eligible for booster. Realized this after visit completed. Will send mychart message.   Meds: No orders of the defined types were placed in this encounter.   Labs/procedures today: none  Treatment Plan:  q3-4 week growth [name], starting BPPs at 28 weeks  Reviewed: Preterm labor symptoms and general obstetric precautions including but not limited to vaginal bleeding, contractions, leaking of fluid and fetal movement were reviewed in detail with the patient.  All questions were answered. Has home bp cuff. Check bp weekly, let us know if >140/90.   Follow-up: No follow-ups on  file.   Future Appointments  Date Time Provider Department Center  01/05/2021  8:30 AM [facility]  01/05/2021  9:00 AM [facility] [name] [facility] None  01/05/2021 10:10 AM Cheral Marker, CNM [facility]    Orders Placed This Encounter  Procedures  . POC Urinalysis Dipstick OB   Federico Flake, MD, MPH, ABFM, [facility] for [facility]  12/15/2020 12:36 PM

## 2021-01-04 ENCOUNTER — Other Ambulatory Visit: Payer: Self-pay | Admitting: Family Medicine

## 2021-01-04 DIAGNOSIS — O30042 Twin pregnancy, dichorionic/diamniotic, second trimester: Secondary | ICD-10-CM

## 2021-01-04 DIAGNOSIS — O09899 Supervision of other high risk pregnancies, unspecified trimester: Secondary | ICD-10-CM

## 2021-01-05 ENCOUNTER — Other Ambulatory Visit: Payer: No Typology Code available for payment source

## 2021-01-05 ENCOUNTER — Ambulatory Visit (INDEPENDENT_AMBULATORY_CARE_PROVIDER_SITE_OTHER): Payer: No Typology Code available for payment source | Admitting: Women's Health

## 2021-01-05 ENCOUNTER — Encounter: Payer: Self-pay | Admitting: *Deleted

## 2021-01-05 ENCOUNTER — Telehealth: Payer: Self-pay | Admitting: Women's Health

## 2021-01-05 ENCOUNTER — Ambulatory Visit (INDEPENDENT_AMBULATORY_CARE_PROVIDER_SITE_OTHER): Payer: No Typology Code available for payment source

## 2021-01-05 ENCOUNTER — Other Ambulatory Visit: Payer: Self-pay

## 2021-01-05 ENCOUNTER — Encounter: Payer: Self-pay | Admitting: Women's Health

## 2021-01-05 VITALS — BP 115/71 | HR 77 | Wt 169.0 lb

## 2021-01-05 DIAGNOSIS — O09899 Supervision of other high risk pregnancies, unspecified trimester: Secondary | ICD-10-CM

## 2021-01-05 DIAGNOSIS — Z8759 Personal history of other complications of pregnancy, childbirth and the puerperium: Secondary | ICD-10-CM

## 2021-01-05 DIAGNOSIS — O30043 Twin pregnancy, dichorionic/diamniotic, third trimester: Secondary | ICD-10-CM

## 2021-01-05 DIAGNOSIS — O30042 Twin pregnancy, dichorionic/diamniotic, second trimester: Secondary | ICD-10-CM | POA: Diagnosis not present

## 2021-01-05 DIAGNOSIS — O0992 Supervision of high risk pregnancy, unspecified, second trimester: Secondary | ICD-10-CM

## 2021-01-05 DIAGNOSIS — O0993 Supervision of high risk pregnancy, unspecified, third trimester: Secondary | ICD-10-CM

## 2021-01-05 DIAGNOSIS — Z98891 History of uterine scar from previous surgery: Secondary | ICD-10-CM

## 2021-01-05 DIAGNOSIS — O09893 Supervision of other high risk pregnancies, third trimester: Secondary | ICD-10-CM

## 2021-01-05 DIAGNOSIS — Z3A28 28 weeks gestation of pregnancy: Secondary | ICD-10-CM

## 2021-01-05 DIAGNOSIS — Q270 Congenital absence and hypoplasia of umbilical artery: Secondary | ICD-10-CM

## 2021-01-05 LAB — POCT URINALYSIS DIPSTICK OB
Blood, UA: NEGATIVE
Glucose, UA: NEGATIVE
Ketones, UA: NEGATIVE
Leukocytes, UA: NEGATIVE
Nitrite, UA: NEGATIVE

## 2021-01-05 NOTE — Telephone Encounter (Signed)
Pt called back regarding her prenatal vit and PA. Pt states she thinks she can get the prenatal at pharmacy in [place] without a PA. I have started PA process but am just waiting now to hear from them. Pt states not to worry about getting PA; she will get vitamins in [place]. Pt advised if there are any issues, let us know. JSY

## 2021-01-05 NOTE — Progress Notes (Signed)
HIGH-RISK PREGNANCY VISIT Patient name: Molly Burton MRN [id number]  Date of birth: Aug 01, 1996 Chief Complaint:   Routine Prenatal Visit  History of Present Illness:   Molly Burton is a 25 y.o. G11P1001 female at [redacted]w[redacted]d with an Estimated Date of Delivery: 03/25/21 being seen today for ongoing management of a high-risk pregnancy complicated by multiple gestation di-di twins.  Today she reports random episodes of SOB, at least once/wk, sometimes last 86min-1hr, sometimes a few hours. Denies feeling heart racing/palpitations/chest pain. Feels like chest may be tight when it happens. No h/o asthma, anxiety. Can just be sitting when it happens .  Depression screen [facility] 2/9 09/16/2020 08/01/2018  Decreased Interest 3 0  Down, Depressed, Hopeless 0 0  PHQ - 2 Score 3 0  Altered sleeping 0 0  Tired, decreased energy 3 1  Change in appetite 0 1  Feeling bad or failure about yourself  0 0  Trouble concentrating 0 0  Moving slowly or fidgety/restless 0 0  Suicidal thoughts 0 0  PHQ-9 Score 6 2    Contractions: Not present. Vag. Bleeding: None.  Movement: Present. denies leaking of fluid.  Review of Systems:   Pertinent items are noted in HPI Denies abnormal vaginal discharge w/ itching/odor/irritation, headaches, visual changes, shortness of breath, chest pain, abdominal pain, severe nausea/vomiting, or problems with urination or bowel movements unless otherwise stated above. Pertinent History Reviewed:  Reviewed past medical,surgical, social, obstetrical and family history.  Reviewed problem list, medications and allergies. Physical Assessment:   Vitals:   01/05/21 0954  BP: 115/71  Pulse: 77  Weight: 169 lb (76.7 kg)  Body mass index is 30.91 kg/m. Pulse ox 100%, HR 59       Physical Examination:   General appearance: alert, well appearing, and in no distress  Mental status: alert, oriented to person, place, and time  Skin: warm & dry   Extremities: Edema: None     Cardiovascular: normal heart rate & rhythm noted  Respiratory: normal respiratory effort, no distress, LCTAB  Abdomen: gravid, soft, non-tender  Pelvic: Cervical exam deferred         Fetal Status: Fetal Heart Rate (bpm): 133/132   Movement: Present    Fetal Surveillance Testing today: [name] 28+5 wks,DI/DI twins BABY A female:breech,posterior placenta gr 0,SVP of fluid 4.6 cm,BPP 8/8,EFW 1366 g 58%,FHR 133 bpm BABY B female:transverse head left,anterior placenta gr 0,SUA,svp of fluid 5.9 cm,BPP 8/8,EFW 1330 G 50%,discordance 2.7%,fhr 132 bpm  Chaperone: N/A    Results for orders placed or performed in visit on 01/05/21 (from the past 24 hour(s))  POC Urinalysis Dipstick OB   Collection Time: 01/05/21 10:12 AM  Result Value Ref Range   Color, UA     Clarity, UA     Glucose, UA Negative Negative   Bilirubin, UA     Ketones, UA neg    Spec Grav, UA     Blood, UA neg    pH, UA     POC,PROTEIN,UA Trace Negative, Trace, Small (1+), Moderate (2+), Large (3+), 4+   Urobilinogen, UA     Nitrite, UA neg    Leukocytes, UA Negative Negative   Appearance     Odor      Assessment & Plan:  High-risk pregnancy: G2P1001 at [redacted]w[redacted]d with an Estimated Date of Delivery: 03/25/21   1) Di-Di twins, stable, discordance 2.7%  2) Twin B w/ SUA, stable, EFW 50% today  3) Hyperthyroidism> no meds, repeat labs w/ PN2 labs  4) H/O GHTN> on ASA  5) Prev c/s  6) Periods of SOB> normal exam today w/ pulse ox 100%, heart & lungs normal. Not tachycardic. No cp. Discussed physiological SOB during pregnancy. If severe SOB, go to hospital to be evaluated.   Meds: No orders of the defined types were placed in this encounter.   Labs/procedures today: u/s. PN2 rescheduled ([facility] across hall closed)  Treatment Plan:  U/S @ 20, 24, 28, 32, 36wks    2x/wk testing @ 36wks or weekly BPP    Deliver @ 38wks:____   Reviewed: Preterm labor symptoms and general obstetric precautions including but not limited to  vaginal bleeding, contractions, leaking of fluid and fetal movement were reviewed in detail with the patient.  All questions were answered. Has home bp cuff.  Check bp weekly, let us know if >140/90.   Follow-up: Return for asap pn2 (no visit), then 4wks from now for HROB MD/CNM w/ twin EFW.   Future Appointments  Date Time Provider Department Center  01/12/2021  8:50 AM [facility] LAB [facility]  02/02/2021  8:30 AM [facility] - [facility] [name] [facility] None  02/02/2021  9:30 AM Cheral Marker, CNM [facility]    Orders Placed This Encounter  Procedures  . US OB Follow Up  . US OB Follow Up AddL Gest  . POC Urinalysis Dipstick OB   Cheral Marker CNM, [facility] 01/05/2021 11:54 AM

## 2021-01-05 NOTE — Telephone Encounter (Signed)
Pt requests a call back from [place], states Marylu Lund has been working with her on a prior auth for her prenatal vitamins

## 2021-01-05 NOTE — Progress Notes (Signed)
[name] 28+5 wks,DI/DI twins BABY A female:breech,posterior placenta gr 0,SVP of fluid 4.6 cm,BPP 8/8,EFW 1366 g 58%,FHR 133 bpm BABY B female:transverse head left,anterior placenta gr 0,SUA,svp of fluid 5.9 cm,BPP 8/8,EFW 1330 G 50%,discordance 2.7%,fhr 132 bpm

## 2021-01-05 NOTE — Patient Instructions (Addendum)
Molly Burton, I greatly value your feedback.  If you receive a survey following your visit with [name] today, we appreciate you taking the time to fill it out.  Thanks, [name], CNM, WHNP-BC   [facility] ([address] [place], [place] [address]) Entrance C, located off of [address] [employer] [address] valet parking   You will have your sugar test next visit.  Please do not eat or drink anything after midnight the night before you come, not even water.  You will be here for at least two hours.    Go to [email or web address] to register for FREE online childbirth classes   Call the office [phone]) or go to [facility] if:  You begin to have strong, frequent contractions  Your water breaks.  Sometimes it is a big gush of fluid, sometimes it is just a trickle that keeps getting your panties wet or running down your legs  You have vaginal bleeding.  It is normal to have a small amount of spotting if your cervix was checked.   You don't feel your baby moving like normal.  If you don't, get you something to eat and drink and lay down and focus on feeling your baby move.  You should feel at least 10 movements in 2 hours.  If you don't, you should call the office or go to [facility].    Tdap Vaccine  It is recommended that you get the Tdap vaccine during the third trimester of EACH pregnancy to help protect your baby from getting pertussis (whooping cough)  27-36 weeks is the BEST time to do this so that you can pass the protection on to your baby. During pregnancy is better than after pregnancy, but if you are unable to get it during pregnancy it will be offered at the hospital.   You can get this vaccine with [name], at the health department, your family doctor, or some local pharmacies  Everyone who will be around your baby should also be up-to-date on their vaccines before the baby comes. Adults (who are not pregnant) only need 1 dose of Tdap during  adulthood.   [place] Pediatricians/Family Doctors:  [facility] [phone]            [facility] [phone]                 [facility] [phone] (usually not accepting new patients unless you have family there already, you are always welcome to call and ask)       [facility] Department [phone]       [facility] Pediatricians/Family Doctors:   [facility]: [phone]  [facility]: [phone]  [facility]: [phone]  [facility] Doctors:   [facility]: [phone]   [facility]: [phone]  [facility] Doctors:  [facility]: [phone]   Home Blood Pressure Monitoring for Patients   Your provider has recommended that you check your blood pressure (BP) at least once a week at home. If you do not have a blood pressure cuff at home, one will be provided for you. Contact your provider if you have not received your monitor within 1 week.   Helpful Tips for Accurate Home Blood Pressure Checks  . Don't smoke, exercise, or drink caffeine 30 minutes before checking your BP . Use the restroom before checking your BP (a full bladder can raise your pressure) . Relax in a comfortable  upright chair . Feet on the ground . Left arm resting comfortably on a flat surface at the level of your heart . Legs uncrossed . Back supported . Sit quietly and don't talk . Place the cuff on your bare arm . Adjust snuggly, so that only two fingertips can fit between your skin and the top of the cuff . Check 2 readings separated by at least one minute . Keep a log of your BP readings . For a visual, please reference this diagram: http://ccnc.care/bpdiagram  Provider Name: [facility]     Phone: [phone]  Zone 1: ALL CLEAR  Continue to monitor your symptoms:  . BP reading is less than 140 (top number) or less than  90 (bottom number)  . No right upper stomach pain . No headaches or seeing spots . No feeling nauseated or throwing up . No swelling in face and hands  Zone 2: CAUTION Call your doctor's office for any of the following:  . BP reading is greater than 140 (top number) or greater than 90 (bottom number)  . Stomach pain under your ribs in the middle or right side . Headaches or seeing spots . Feeling nauseated or throwing up . Swelling in face and hands  Zone 3: EMERGENCY  Seek immediate medical care if you have any of the following:  . BP reading is greater than160 (top number) or greater than 110 (bottom number) . Severe headaches not improving with Tylenol . Serious difficulty catching your breath . Any worsening symptoms from Zone 2   Third Trimester of Pregnancy The third trimester is from week 29 through week 42, months 7 through 9. The third trimester is a time when the fetus is growing rapidly. At the end of the ninth month, the fetus is about 20 inches in length and weighs 6-10 pounds.  BODY CHANGES Your body goes through many changes during pregnancy. The changes vary from woman to woman.   Your weight will continue to increase. You can expect to gain 25-35 pounds (11-16 kg) by the end of the pregnancy.  You may begin to get stretch marks on your hips, abdomen, and breasts.  You may urinate more often because the fetus is moving lower into your pelvis and pressing on your bladder.  You may develop or continue to have heartburn as a result of your pregnancy.  You may develop constipation because certain hormones are causing the muscles that push waste through your intestines to slow down.  You may develop hemorrhoids or swollen, bulging veins (varicose veins).  You may have pelvic pain because of the weight gain and pregnancy hormones relaxing your joints between the bones in your pelvis. Backaches may result from overexertion of the muscles supporting your posture.  You  may have changes in your hair. These can include thickening of your hair, rapid growth, and changes in texture. Some women also have hair loss during or after pregnancy, or hair that feels dry or thin. Your hair will most likely return to normal after your baby is born.  Your breasts will continue to grow and be tender. A yellow discharge may leak from your breasts called colostrum.  Your belly button may stick out.  You may feel short of breath because of your expanding uterus.  You may notice the fetus "dropping," or moving lower in your abdomen.  You may have a bloody mucus discharge. This usually occurs a few days to a week before labor begins.  Your cervix becomes  thin and soft (effaced) near your due date. WHAT TO EXPECT AT YOUR PRENATAL EXAMS  You will have prenatal exams every 2 weeks until week 36. Then, you will have weekly prenatal exams. During a routine prenatal visit:  You will be weighed to make sure you and the fetus are growing normally.  Your blood pressure is taken.  Your abdomen will be measured to track your baby's growth.  The fetal heartbeat will be listened to.  Any test results from the previous visit will be discussed.  You may have a cervical check near your due date to see if you have effaced. At around 36 weeks, your caregiver will check your cervix. At the same time, your caregiver will also perform a test on the secretions of the vaginal tissue. This test is to determine if a type of bacteria, Group B streptococcus, is present. Your caregiver will explain this further. Your caregiver may ask you:  What your birth plan is.  How you are feeling.  If you are feeling the baby move.  If you have had any abnormal symptoms, such as leaking fluid, bleeding, severe headaches, or abdominal cramping.  If you have any questions. Other tests or screenings that may be performed during your third trimester include:  Blood tests that check for low iron levels  (anemia).  Fetal testing to check the health, activity level, and growth of the fetus. Testing is done if you have certain medical conditions or if there are problems during the pregnancy. FALSE LABOR You may feel small, irregular contractions that eventually go away. These are called Braxton Hicks contractions, or false labor. Contractions may last for hours, days, or even weeks before true labor sets in. If contractions come at regular intervals, intensify, or become painful, it is best to be seen by your caregiver.  SIGNS OF LABOR   Menstrual-like cramps.  Contractions that are 5 minutes apart or less.  Contractions that start on the top of the uterus and spread down to the lower abdomen and back.  A sense of increased pelvic pressure or back pain.  A watery or bloody mucus discharge that comes from the vagina. If you have any of these signs before the 37th week of pregnancy, call your caregiver right away. You need to go to the hospital to get checked immediately. HOME CARE INSTRUCTIONS   Avoid all smoking, herbs, alcohol, and unprescribed drugs. These chemicals affect the formation and growth of the baby.  Follow your caregiver's instructions regarding medicine use. There are medicines that are either safe or unsafe to take during pregnancy.  Exercise only as directed by your caregiver. Experiencing uterine cramps is a good sign to stop exercising.  Continue to eat regular, healthy meals.  Wear a good support bra for breast tenderness.  Do not use hot tubs, steam rooms, or saunas.  Wear your seat belt at all times when driving.  Avoid raw meat, uncooked cheese, cat litter boxes, and soil used by cats. These carry germs that can cause birth defects in the baby.  Take your prenatal vitamins.  Try taking a stool softener (if your caregiver approves) if you develop constipation. Eat more high-fiber foods, such as fresh vegetables or fruit and whole grains. Drink plenty of fluids  to keep your urine clear or pale yellow.  Take warm sitz baths to soothe any pain or discomfort caused by hemorrhoids. Use hemorrhoid cream if your caregiver approves.  If you develop varicose veins, wear support hose. Elevate your  feet for 15 minutes, 3-4 times a day. Limit salt in your diet.  Avoid heavy lifting, wear low heal shoes, and practice good posture.  Rest a lot with your legs elevated if you have leg cramps or low back pain.  Visit your dentist if you have not gone during your pregnancy. Use a soft toothbrush to brush your teeth and be gentle when you floss.  A sexual relationship may be continued unless your caregiver directs you otherwise.  Do not travel far distances unless it is absolutely necessary and only with the approval of your caregiver.  Take prenatal classes to understand, practice, and ask questions about the labor and delivery.  Make a trial run to the hospital.  Pack your hospital bag.  Prepare the baby's nursery.  Continue to go to all your prenatal visits as directed by your caregiver. SEEK MEDICAL CARE IF:  You are unsure if you are in labor or if your water has broken.  You have dizziness.  You have mild pelvic cramps, pelvic pressure, or nagging pain in your abdominal area.  You have persistent nausea, vomiting, or diarrhea.  You have a bad smelling vaginal discharge.  You have pain with urination. SEEK IMMEDIATE MEDICAL CARE IF:   You have a fever.  You are leaking fluid from your vagina.  You have spotting or bleeding from your vagina.  You have severe abdominal cramping or pain.  You have rapid weight loss or gain.  You have shortness of breath with chest pain.  You notice sudden or extreme swelling of your face, hands, ankles, feet, or legs.  You have not felt your baby move in over an hour.  You have severe headaches that do not go away with medicine.  You have vision changes. Document Released: 11/08/2001 Document  Revised: 11/19/2013 Document Reviewed: 01/15/2013 [facility] Patient Information 2015 [place], [place]. This information is not intended to replace advice given to you by your health care provider. Make sure you discuss any questions you have with your health care provider.  AREA PEDIATRIC/FAMILY PRACTICE PHYSICIANS  [facility] [place] [address] [place], [place] [address] Phone - [phone]   Fax - [phone]  [facility] AMOS [address] [place], [place]  [address] Phone - [phone]   Fax - [phone]  [facility] [address] [place], [place]  [address] Phone - [phone]   Fax - [phone]  [facility] [place] [address] [place], [place]  [address] Phone - [phone]   Fax - [phone]  [facility] [address] [place], [place]  [address] Phone - [phone]   Fax - [phone]  [facility] [address] [place], [place]  [address] Phone - [phone]   Fax - [phone]  [facility] [place] [address] [place], [place]  [address] Phone - [phone]   Fax - [phone]  [facility] AT [facility] [address] [place], [address] [place], [place]  [address] Phone - [phone]   Fax - [phone]  [facility] AT [facility] [address] [place], [place]  [address] Phone - [phone]   Fax - [phone] [facility] AT [place] [address] [place], [place]  [address] Phone - [phone]   Fax - [phone]  [facility] [address] [place], [place]  [address] Phone - [phone]   Fax - [phone]  [facility]  AT [place] [address] [place], [place]  [address] Phone - [phone]   Fax - [phone]  [facility] [place] [address] [place], [place]  [address] Phone -  [phone]   Fax - [phone]  [facility] [address] [place], [place]  [address] Phone - [phone]  [facility] [address] [place], [place]  [address] Phone - [phone]   Fax - [phone]  [facility] [address] [place], [place]  [address] Phone - [phone]   Fax - [phone]  [facility] [address] [place], [place]  [address] Phone - [phone]   Fax - [phone]  [facility] [address] [place], [place]  [address] Phone - [phone]   Fax - [phone]   [facility] [address] [place] [address] [place], [place]  [address] Phone - [phone]   Fax - [phone]  [facility] [address] [place], [place]  [address] Phone - [phone]   Fax - [phone]  DAVID RUBIN [address] [place], [place]  [address] Phone - [phone]   Fax - [phone]  [facility] [address] [place], [place]  [address] Phone - [phone]   Fax - [phone]  [facility] [facility] Alita Chyle [address] [place], [place]  [address] Phone - [phone]   Fax - [phone] Gerarda Fraction [address] [place], [place]  [address] Phone - [phone]   Fax - [phone]  [facility] [address] [place], [place]  [address] Phone - [phone]   Fax - [phone]  [facility] - [place] [address] [place], [place]  [address] Phone - [phone]   Fax - [phone]

## 2021-01-06 ENCOUNTER — Telehealth: Payer: Self-pay | Admitting: Women's Health

## 2021-01-06 ENCOUNTER — Other Ambulatory Visit: Payer: Self-pay | Admitting: Advanced Practice Midwife

## 2021-01-06 MED ORDER — PRENATAL VITAMINS 28-0.8 MG PO TABS: 1.0000 | ORAL_TABLET | Freq: Every day | ORAL | 11 refills | Status: DC

## 2021-01-06 NOTE — Telephone Encounter (Signed)
Pt states she's been speaking with Marylu Lund about a prior auth for her prenatals Pharmacy told her to check with [name] to see if we could order an alternative  Please advise & notify pt    [facility]

## 2021-01-12 ENCOUNTER — Other Ambulatory Visit: Payer: No Typology Code available for payment source

## 2021-01-12 DIAGNOSIS — Z131 Encounter for screening for diabetes mellitus: Secondary | ICD-10-CM

## 2021-01-12 DIAGNOSIS — O0993 Supervision of high risk pregnancy, unspecified, third trimester: Secondary | ICD-10-CM

## 2021-01-12 DIAGNOSIS — E0590 Thyrotoxicosis, unspecified without thyrotoxic crisis or storm: Secondary | ICD-10-CM

## 2021-01-12 DIAGNOSIS — Z3A29 29 weeks gestation of pregnancy: Secondary | ICD-10-CM

## 2021-01-13 ENCOUNTER — Other Ambulatory Visit: Payer: Self-pay | Admitting: Women's Health

## 2021-01-13 LAB — COMPREHENSIVE METABOLIC PANEL
ALT: 23 IU/L (ref 0–32)
AST: 21 IU/L (ref 0–40)
Albumin/Globulin Ratio: 1.3 (ref 1.2–2.2)
Albumin: 3.5 g/dL — ABNORMAL LOW (ref 3.9–5.0)
Alkaline Phosphatase: 89 IU/L (ref 44–121)
BUN/Creatinine Ratio: 14 (ref 9–23)
BUN: 8 mg/dL (ref 6–20)
Bilirubin Total: 0.2 mg/dL (ref 0.0–1.2)
CO2: 18 mmol/L — ABNORMAL LOW (ref 20–29)
Calcium: 8.6 mg/dL — ABNORMAL LOW (ref 8.7–10.2)
Chloride: 104 mmol/L (ref 96–106)
Creatinine, Ser: 0.59 mg/dL (ref 0.57–1.00)
GFR calc Af Amer: 146 mL/min/{1.73_m2} (ref >59)
GFR calc non Af Amer: 127 mL/min/{1.73_m2} (ref >59)
Globulin, Total: 2.7 g/dL (ref 1.5–4.5)
Glucose: 74 mg/dL (ref 65–99)
Potassium: 3.6 mmol/L (ref 3.5–5.2)
Sodium: 137 mmol/L (ref 134–144)
Total Protein: 6.2 g/dL (ref 6.0–8.5)

## 2021-01-13 LAB — HIV ANTIBODY (ROUTINE TESTING W REFLEX): HIV Screen 4th Generation wRfx: NONREACTIVE

## 2021-01-13 LAB — CBC
Hematocrit: 31.3 % — ABNORMAL LOW (ref 34.0–46.6)
Hemoglobin: 10.6 g/dL — ABNORMAL LOW (ref 11.1–15.9)
MCH: 31 pg (ref 26.6–33.0)
MCHC: 33.9 g/dL (ref 31.5–35.7)
MCV: 92 fL (ref 79–97)
Platelets: 190 10*3/uL (ref 150–450)
RBC: 3.42 x10E6/uL — ABNORMAL LOW (ref 3.77–5.28)
RDW: 12.9 % (ref 11.7–15.4)
WBC: 11.1 10*3/uL — ABNORMAL HIGH (ref 3.4–10.8)

## 2021-01-13 LAB — GLUCOSE TOLERANCE, 2 HOURS W/ 1HR
Glucose, 1 hour: 131 mg/dL (ref 65–179)
Glucose, 2 hour: 103 mg/dL (ref 65–152)
Glucose, Fasting: 73 mg/dL (ref 65–91)

## 2021-01-13 LAB — RPR: RPR Ser Ql: NONREACTIVE

## 2021-01-13 LAB — TSH: TSH: 0.402 u[IU]/mL — ABNORMAL LOW (ref 0.450–4.500)

## 2021-01-13 LAB — ANTIBODY SCREEN: Antibody Screen: NEGATIVE

## 2021-01-13 LAB — T4, FREE: Free T4: 1.12 ng/dL (ref 0.82–1.77)

## 2021-01-13 LAB — T3: T3, Total: 261 ng/dL — ABNORMAL HIGH (ref 71–180)

## 2021-01-13 MED ORDER — FERROUS SULFATE 325 (65 FE) MG PO TABS: 325.0000 mg | ORAL_TABLET | ORAL | 2 refills | Status: DC

## 2021-01-18 ENCOUNTER — Encounter: Payer: Self-pay | Admitting: *Deleted

## 2021-01-18 ENCOUNTER — Other Ambulatory Visit: Payer: Self-pay

## 2021-01-18 ENCOUNTER — Ambulatory Visit (INDEPENDENT_AMBULATORY_CARE_PROVIDER_SITE_OTHER): Payer: No Typology Code available for payment source | Admitting: *Deleted

## 2021-01-18 VITALS — BP 114/69 | HR 76

## 2021-01-18 DIAGNOSIS — Q270 Congenital absence and hypoplasia of umbilical artery: Secondary | ICD-10-CM

## 2021-01-18 DIAGNOSIS — O0993 Supervision of high risk pregnancy, unspecified, third trimester: Secondary | ICD-10-CM | POA: Diagnosis not present

## 2021-01-18 DIAGNOSIS — Z8759 Personal history of other complications of pregnancy, childbirth and the puerperium: Secondary | ICD-10-CM

## 2021-01-18 DIAGNOSIS — Z3A30 30 weeks gestation of pregnancy: Secondary | ICD-10-CM

## 2021-01-18 DIAGNOSIS — O30043 Twin pregnancy, dichorionic/diamniotic, third trimester: Secondary | ICD-10-CM

## 2021-01-18 DIAGNOSIS — Z98891 History of uterine scar from previous surgery: Secondary | ICD-10-CM

## 2021-01-18 NOTE — Progress Notes (Addendum)
   NURSE VISIT- NST  SUBJECTIVE:  Molly Burton is a 25 y.o. G2P1001 female at [redacted]w[redacted]d, here for a NST for pregnancy complicated by Multiple gestation.  She reports decreased  fetal movement of twin A, contractions: none, vaginal bleeding: none, membranes: intact.   OBJECTIVE:  BP 114/69   Pulse 76   LMP 06/18/2020   Appears well, no apparent distress  No results found for this or any previous visit (from the past 24 hour(s)).  NST: FHR baseline Baby A 130 bpm, Variability: moderate, Accelerations:present, Decelerations:  Absent= Cat 1/Reactive FHT baseline Baby B 135, Variability:moderate, Acceleration:present, Decelerations:absent=cat 1/Reactive  Toco: none   ASSESSMENT: G2P1001 at [redacted]w[redacted]d with Multiple gestation with decreased fetal movement of baby A NST reactive  PLAN: EFM strip reviewed by Cathie Beams, CNM   Recommendations: keep next appointment as scheduled    Jobe Marker  01/18/2021 4:19 PM  Chart reviewed for nurse visit. Agree with plan of care.  Jacklyn Shell, [place] 01/18/2021 7:32 PM

## 2021-01-27 ENCOUNTER — Encounter: Payer: Self-pay | Admitting: *Deleted

## 2021-01-29 ENCOUNTER — Encounter: Payer: Self-pay | Admitting: *Deleted

## 2021-01-29 ENCOUNTER — Other Ambulatory Visit: Payer: Self-pay | Admitting: Women's Health

## 2021-01-29 MED ORDER — CITRANATAL ASSURE 35-1 & 300 MG PO MISC: ORAL | 3 refills | Status: DC

## 2021-02-02 ENCOUNTER — Encounter: Payer: Self-pay | Admitting: Women's Health

## 2021-02-02 ENCOUNTER — Ambulatory Visit (INDEPENDENT_AMBULATORY_CARE_PROVIDER_SITE_OTHER): Payer: No Typology Code available for payment source

## 2021-02-02 ENCOUNTER — Other Ambulatory Visit: Payer: Self-pay

## 2021-02-02 ENCOUNTER — Ambulatory Visit (INDEPENDENT_AMBULATORY_CARE_PROVIDER_SITE_OTHER): Payer: No Typology Code available for payment source | Admitting: Women's Health

## 2021-02-02 VITALS — BP 119/76 | HR 87 | Wt 175.5 lb

## 2021-02-02 DIAGNOSIS — O30043 Twin pregnancy, dichorionic/diamniotic, third trimester: Secondary | ICD-10-CM

## 2021-02-02 DIAGNOSIS — Z98891 History of uterine scar from previous surgery: Secondary | ICD-10-CM

## 2021-02-02 DIAGNOSIS — O0993 Supervision of high risk pregnancy, unspecified, third trimester: Secondary | ICD-10-CM | POA: Diagnosis not present

## 2021-02-02 DIAGNOSIS — Q270 Congenital absence and hypoplasia of umbilical artery: Secondary | ICD-10-CM

## 2021-02-02 DIAGNOSIS — Z8759 Personal history of other complications of pregnancy, childbirth and the puerperium: Secondary | ICD-10-CM

## 2021-02-02 DIAGNOSIS — Z3A32 32 weeks gestation of pregnancy: Secondary | ICD-10-CM

## 2021-02-02 LAB — POCT URINALYSIS DIPSTICK OB
Blood, UA: NEGATIVE
Glucose, UA: NEGATIVE
Leukocytes, UA: NEGATIVE
Nitrite, UA: NEGATIVE

## 2021-02-02 NOTE — Patient Instructions (Signed)
Molly Burton, I greatly value your feedback.  If you receive a survey following your visit with [name] today, we appreciate you taking the time to fill it out.  Thanks, Molly Burton, CNM, WHNP-BC  [facility] ([address] [place], [place] [address]) Entrance C, located off of [address] valet parking   Go to [email or web address] to register for FREE online childbirth classes    Call the office [phone]) or go to [facility] if:  You begin to have strong, frequent contractions  Your water breaks.  Sometimes it is a big gush of fluid, sometimes it is just a trickle that keeps getting your panties wet or running down your legs  You have vaginal bleeding.  It is normal to have a small amount of spotting if your cervix was checked.   You don't feel your baby moving like normal.  If you don't, get you something to eat and drink and lay down and focus on feeling your baby move.  You should feel at least 10 movements in 2 hours.  If you don't, you should call the office or go to [facility].   Call the office [phone]) or go to [facility] for these signs of pre-eclampsia:  Severe headache that does not go away with Tylenol  Visual changes- seeing spots, double, blurred vision  Pain under your right breast or upper abdomen that does not go away with Tums or heartburn medicine  Nausea and/or vomiting  Severe swelling in your hands, feet, and face    Home Blood Pressure Monitoring for Patients   Your provider has recommended that you check your blood pressure (BP) at least once a week at home. If you do not have a blood pressure cuff at home, one will be provided for you. Contact your provider if you have not received your monitor within 1 week.   Helpful Tips for Accurate Home Blood Pressure Checks  . Don't smoke, exercise, or drink caffeine 30 minutes before checking your BP . Use the restroom before checking your BP (a full  bladder can raise your pressure) . Relax in a comfortable upright chair . Feet on the ground . Left arm resting comfortably on a flat surface at the level of your heart . Legs uncrossed . Back supported . Sit quietly and don't talk . Place the cuff on your bare arm . Adjust snuggly, so that only two fingertips can fit between your skin and the top of the cuff . Check 2 readings separated by at least one minute . Keep a log of your BP readings . For a visual, please reference this diagram: http://ccnc.care/bpdiagram  Provider Name: [facility]     Phone: [phone]  Zone 1: ALL CLEAR  Continue to monitor your symptoms:  . BP reading is less than 140 (top number) or less than 90 (bottom number)  . No right upper stomach pain . No headaches or seeing spots . No feeling nauseated or throwing up . No swelling in face and hands  Zone 2: CAUTION Call your doctor's office for any of the following:  . BP reading is greater than 140 (top number) or greater than 90 (bottom number)  . Stomach pain under your ribs in the middle or right side . Headaches or seeing spots . Feeling nauseated or throwing up . Swelling in face and hands  Zone 3: EMERGENCY  Seek immediate medical care if you have any of  the following:  . BP reading is greater than160 (top number) or greater than 110 (bottom number) . Severe headaches not improving with Tylenol . Serious difficulty catching your breath . Any worsening symptoms from Zone 2  Preterm Labor and Birth Information  The normal length of a pregnancy is 39-41 weeks. Preterm labor is when labor starts before 37 completed weeks of pregnancy. What are the risk factors for preterm labor? Preterm labor is more likely to occur in women who:  Have certain infections during pregnancy such as a bladder infection, sexually transmitted infection, or infection inside the uterus (chorioamnionitis).  Have a shorter-than-normal cervix.  Have gone into  preterm labor before.  Have had surgery on their cervix.  Are younger than age 54 or older than age 25.  Are African American.  Are pregnant with twins or multiple babies (multiple gestation).  Take street drugs or smoke while pregnant.  Do not gain enough weight while pregnant.  Became pregnant shortly after having been pregnant. What are the symptoms of preterm labor? Symptoms of preterm labor include:  Cramps similar to those that can happen during a menstrual period. The cramps may happen with diarrhea.  Pain in the abdomen or lower back.  Regular uterine contractions that may feel like tightening of the abdomen.  A feeling of increased pressure in the pelvis.  Increased watery or bloody mucus discharge from the vagina.  Water breaking (ruptured amniotic sac). Why is it important to recognize signs of preterm labor? It is important to recognize signs of preterm labor because babies who are born prematurely may not be fully developed. This can put them at an increased risk for:  Long-term (chronic) heart and lung problems.  Difficulty immediately after birth with regulating body systems, including blood sugar, body temperature, heart rate, and breathing rate.  Bleeding in the brain.  Cerebral palsy.  Learning difficulties.  Death. These risks are highest for babies who are born before 48 weeks of pregnancy. How is preterm labor treated? Treatment depends on the length of your pregnancy, your condition, and the health of your baby. It may involve: 1. Having a stitch (suture) placed in your cervix to prevent your cervix from opening too early (cerclage). 2. Taking or being given medicines, such as: ? Hormone medicines. These may be given early in pregnancy to help support the pregnancy. ? Medicine to stop contractions. ? Medicines to help mature the baby's lungs. These may be prescribed if the risk of delivery is high. ? Medicines to prevent your baby from  developing cerebral palsy. If the labor happens before 34 weeks of pregnancy, you may need to stay in the hospital. What should I do if I think I am in preterm labor? If you think that you are going into preterm labor, call your health care provider right away. How can I prevent preterm labor in future pregnancies? To increase your chance of having a full-term pregnancy:  Do not use any tobacco products, such as cigarettes, chewing tobacco, and e-cigarettes. If you need help quitting, ask your health care provider.  Do not use street drugs or medicines that have not been prescribed to you during your pregnancy.  Talk with your health care provider before taking any herbal supplements, even if you have been taking them regularly.  Make sure you gain a healthy amount of weight during your pregnancy.  Watch for infection. If you think that you might have an infection, get it checked right away.  Make sure to  tell your health care provider if you have gone into preterm labor before. This information is not intended to replace advice given to you by your health care provider. Make sure you discuss any questions you have with your health care provider. Document Revised: 03/08/2019 Document Reviewed: 04/06/2016 Elsevier Patient Education  [place].

## 2021-02-02 NOTE — Progress Notes (Signed)
[name] DI/DI TWINS:  BABY A : cephalic right,fhr 138 bpm,svp of fluid 4.6 cm,posterior placenta gr 0,efw 2285 g 74% BABY B:  Cephalic left,fhr 150 bpm,svp of fluid 4.5 cm,anterior placenta gr 0,efw 2073 g 46%,discordance 9.3%,single umbilical artery

## 2021-02-02 NOTE — Progress Notes (Addendum)
HIGH-RISK PREGNANCY VISIT Patient name: Molly Burton MRN [id number]  Date of birth: Apr 05, 1996 Chief Complaint:   High Risk Gestation ([name] today; feels dehydrated)  History of Present Illness:   Molly Burton is a 25 y.o. G26P1001 female at [redacted]w[redacted]d with an Estimated Date of Delivery: 03/25/21 being seen today for ongoing management of a high-risk pregnancy complicated by multiple gestation di-di twins, twin B w/ SUA, prev c/s, hyperthyroidism stable w/o meds.  Today she reports no complaints. Wants RCS.  Depression screen [place] 2/9 09/16/2020 08/01/2018  Decreased Interest 3 0  Down, Depressed, Hopeless 0 0  PHQ - 2 Score 3 0  Altered sleeping 0 0  Tired, decreased energy 3 1  Change in appetite 0 1  Feeling bad or failure about yourself  0 0  Trouble concentrating 0 0  Moving slowly or fidgety/restless 0 0  Suicidal thoughts 0 0  PHQ-9 Score 6 2    Contractions: Irregular. Vag. Bleeding: None.  Movement: Present. denies leaking of fluid.  Review of Systems:   Pertinent items are noted in HPI Denies abnormal vaginal discharge w/ itching/odor/irritation, headaches, visual changes, shortness of breath, chest pain, abdominal pain, severe nausea/vomiting, or problems with urination or bowel movements unless otherwise stated above. Pertinent History Reviewed:  Reviewed past medical,surgical, social, obstetrical and family history.  Reviewed problem list, medications and allergies. Physical Assessment:   Vitals:   02/02/21 0940  BP: 119/76  Pulse: 87  Weight: 175 lb 8 oz (79.6 kg)  Body mass index is 32.1 kg/m.           Physical Examination:   General appearance: alert, well appearing, and in no distress  Mental status: alert, oriented to person, place, and time  Skin: warm & dry   Extremities: Edema: Trace    Cardiovascular: normal heart rate noted  Respiratory: normal respiratory effort, no distress  Abdomen: gravid, soft, non-tender  Pelvic: Cervical exam deferred          Fetal Status: Fetal Heart Rate (bpm): 138/150   Movement: Present    Fetal Surveillance Testing today:  [name] DI/DI TWINS:  BABY A : cephalic right,fhr 138 bpm,svp of fluid 4.6 cm,posterior placenta gr 0,efw 2285 g 74% BABY B:  Cephalic left,fhr 150 bpm,svp of fluid 4.5 cm,anterior placenta gr 0,efw 2073 g 46%,discordance 9.3%,single umbilical artery    Chaperone: N/A    Results for orders placed or performed in visit on 02/02/21 (from the past 24 hour(s))  POC Urinalysis Dipstick OB   Collection Time: 02/02/21  9:43 AM  Result Value Ref Range   Color, UA     Clarity, UA     Glucose, UA Negative Negative   Bilirubin, UA     Ketones, UA 4+    Spec Grav, UA     Blood, UA neg    pH, UA     POC,PROTEIN,UA Small (1+) Negative, Trace, Small (1+), Moderate (2+), Large (3+), 4+   Urobilinogen, UA     Nitrite, UA neg    Leukocytes, UA Negative Negative   Appearance     Odor      Assessment & Plan:  High-risk pregnancy: G2P1001 at [redacted]w[redacted]d with an Estimated Date of Delivery: 03/25/21   1) Di-Di twins, stable, 9.3% discordance today  2) Baby B w/ SUA, stable  3) Prev c/s> wants RCS, schedule w/ MD next visit, note routed to [facility]  4) Hyperthyroidism> no meds, stable  5) H/O GHTN> ASA, reviewed pre-e s/s, reasons  to seek care  6) Dehydrated> cut back on sodas, increase water Meds: No orders of the defined types were placed in this encounter.   Labs/procedures today: U/S  Treatment Plan:  U/S q4wks    2x/wk testing @ 36wks or weekly BPP    Deliver @ 38wks:____   Reviewed: Preterm labor symptoms and general obstetric precautions including but not limited to vaginal bleeding, contractions, leaking of fluid and fetal movement were reviewed in detail with the patient.  All questions were answered. Has home bp cuff. Check bp weekly, let us know if >140/90.   Follow-up: Return in 2 weeks (on 02/16/2021) for HROB, in person, MD or CNM; then 4wks from now for HROB w/ bpp/efw twins and hrob  md/cnm.   Future Appointments  Date Time Provider Department Center  03/01/2021  3:15 PM [facility] - [facility]TO[facility]YN [name] [facility] None  03/01/2021  4:30 PM Eure, Amaryllis Dyke, MD [facility]YN    Orders Placed This Encounter  Procedures   US OB Follow Up   US FETAL BPP WO NON STRESS   US OB Follow Up AddL Gest   POC Urinalysis Dipstick OB   Cheral Marker CNM, [facility] 02/02/2021 3:10 PM

## 2021-02-05 ENCOUNTER — Telehealth: Payer: Self-pay | Admitting: Obstetrics & Gynecology

## 2021-02-05 NOTE — Telephone Encounter (Signed)
I returned pt's call. Pt states she has already spoken with Dr. Despina Hidden. Encounter closed. JSY

## 2021-02-05 NOTE — Telephone Encounter (Signed)
Pt called, would like a return call from [facility] or Dr. Despina Hidden - questions about ultrasound  Please advise & call pt

## 2021-02-12 ENCOUNTER — Telehealth: Payer: Self-pay | Admitting: Women's Health

## 2021-02-12 NOTE — Telephone Encounter (Signed)
Pt requesting a call back from [name], she has questions about something she saw on an ultrasound image

## 2021-02-16 NOTE — Telephone Encounter (Signed)
Called pt to get clarification on request to speak with Molly Burton. Pt stated that she is still concerned about one of her baby's head looking swollen. Despite a previous conversation with Dr Despina Hidden regarding the same concerns. She stated that she has continuously worried and can't shake thinking that something is wrong. Tried to allay her fears by confirming what Dr Despina Hidden said. She has an appt with [name] on 3/23 and another [name] on 4/4. Pt wants a follow-up msg sent via Mychart.

## 2021-02-16 NOTE — Telephone Encounter (Signed)
Sent pt a Mychart msg related to concerns per pt request.

## 2021-02-17 ENCOUNTER — Other Ambulatory Visit: Payer: Self-pay

## 2021-02-17 ENCOUNTER — Ambulatory Visit (INDEPENDENT_AMBULATORY_CARE_PROVIDER_SITE_OTHER): Payer: No Typology Code available for payment source | Admitting: Advanced Practice Midwife

## 2021-02-17 VITALS — BP 137/83 | HR 82 | Wt 193.0 lb

## 2021-02-17 DIAGNOSIS — Z3A34 34 weeks gestation of pregnancy: Secondary | ICD-10-CM

## 2021-02-17 DIAGNOSIS — O09893 Supervision of other high risk pregnancies, third trimester: Secondary | ICD-10-CM

## 2021-02-17 DIAGNOSIS — Z98891 History of uterine scar from previous surgery: Secondary | ICD-10-CM

## 2021-02-17 LAB — POCT URINALYSIS DIPSTICK OB
Blood, UA: NEGATIVE
Glucose, UA: NEGATIVE
Ketones, UA: NEGATIVE
Leukocytes, UA: NEGATIVE
Nitrite, UA: NEGATIVE
POC,PROTEIN,UA: NEGATIVE

## 2021-02-17 NOTE — Progress Notes (Signed)
HIGH-RISK PREGNANCY VISIT Patient name: Molly Burton MRN [id number]  Date of birth: [birth year] Chief Complaint:   Routine Prenatal Visit  History of Present Illness:   Molly Burton is a 25 y.o. G55P1001 female at [redacted]w[redacted]d with an Estimated Date of Delivery: 03/25/21 being seen today for ongoing management of a high-risk pregnancy complicated by multiple gestation di/di twins, twin B SUA, prev C/S, hyperthyroidism.  Today she reports some increased BLE, no s/s pre-e.  Depression screen [facility] 2/9 09/16/2020 08/01/2018  Decreased Interest 3 0  Down, Depressed, Hopeless 0 0  PHQ - 2 Score 3 0  Altered sleeping 0 0  Tired, decreased energy 3 1  Change in appetite 0 1  Feeling bad or failure about yourself  0 0  Trouble concentrating 0 0  Moving slowly or fidgety/restless 0 0  Suicidal thoughts 0 0  PHQ-9 Score 6 2    Contractions: Irritability. Vag. Bleeding: None.  Movement: Present. denies leaking of fluid.  Review of Systems:   Pertinent items are noted in HPI Denies abnormal vaginal discharge w/ itching/odor/irritation, headaches, visual changes, shortness of breath, chest pain, abdominal pain, severe nausea/vomiting, or problems with urination or bowel movements unless otherwise stated above. Pertinent History Reviewed:  Reviewed past medical,surgical, social, obstetrical and family history.  Reviewed problem list, medications and allergies. Physical Assessment:   Vitals:   02/17/21 1411  BP: 137/83  Pulse: 82  Weight: 193 lb (87.5 kg)  Body mass index is 35.3 kg/m.           Physical Examination:   General appearance: alert, well appearing, and in no distress  Mental status: alert, oriented to person, place, and time  Skin: warm & dry   Extremities: Edema: Mild pitting, slight indentation    Cardiovascular: normal heart rate noted  Respiratory: normal respiratory effort, no distress  Abdomen: gravid, soft, non-tender  Pelvic: Cervical exam deferred         Fetal  Status: Fetal Heart Rate (bpm): 143/135   Movement: Present    Fetal Surveillance Testing today: doppler    Results for orders placed or performed in visit on 02/17/21 (from the past 24 hour(s))  POC Urinalysis Dipstick OB   Collection Time: 02/17/21  2:18 PM  Result Value Ref Range   Color, UA     Clarity, UA     Glucose, UA Negative Negative   Bilirubin, UA     Ketones, UA neg    Spec Grav, UA     Blood, UA neg    pH, UA     POC,PROTEIN,UA Negative Negative, Trace, Small (1+), Moderate (2+), Large (3+), 4+   Urobilinogen, UA     Nitrite, UA neg    Leukocytes, UA Negative Negative   Appearance     Odor      Assessment & Plan:  High-risk pregnancy: G2P1001 at [redacted]w[redacted]d with an Estimated Date of Delivery: 03/25/21   1) Di/di twins, stable, 9.3% discordance at 32wks  2) Baby B with SUA, stable  3) Prev C/S, for rLTCS on 03/15/21  4) Hyperthyroidism, stable without meds  5) Hx gHTN, increased swelling & 18lb weight gain; asymptomatic and neg proteinuria; reviewed reasons to notify us  Meds: No orders of the defined types were placed in this encounter.   Labs/procedures today: none  Treatment Plan:  Start 2x/wk testing @ 36wks- already scheduled for next time  Reviewed: Preterm labor symptoms and general obstetric precautions including but not limited to vaginal bleeding, contractions,  leaking of fluid and fetal movement were reviewed in detail with the patient.  All questions were answered. Has home bp cuff. Check bp weekly, let us know if >140/90.   Follow-up: Return for As scheduled.   Future Appointments  Date Time Provider Department Center  03/01/2021  3:15 PM [facility] - [facility]TO[facility]YN [name] [facility] None  03/01/2021  4:15 PM Eure, Amaryllis Dyke, MD [facility] FTOBGYN    Orders Placed This Encounter  Procedures  . POC Urinalysis Dipstick OB   Arabella Merles [facility] 02/17/2021 2:54 PM

## 2021-02-18 ENCOUNTER — Telehealth: Payer: Self-pay

## 2021-02-18 NOTE — Telephone Encounter (Signed)
Pt returning a call about information from her employer for the [facility]

## 2021-02-22 ENCOUNTER — Encounter: Payer: Self-pay | Admitting: *Deleted

## 2021-02-23 ENCOUNTER — Telehealth: Payer: Self-pay | Admitting: Women's Health

## 2021-02-23 NOTE — Telephone Encounter (Signed)
Patient called stating that she would like to speak with Tish, Patient did not state the reason for the call. Please contact pt

## 2021-02-24 ENCOUNTER — Other Ambulatory Visit: Payer: No Typology Code available for payment source

## 2021-02-26 ENCOUNTER — Other Ambulatory Visit: Payer: Self-pay | Admitting: Women's Health

## 2021-02-26 DIAGNOSIS — O30043 Twin pregnancy, dichorionic/diamniotic, third trimester: Secondary | ICD-10-CM

## 2021-02-26 DIAGNOSIS — O0993 Supervision of high risk pregnancy, unspecified, third trimester: Secondary | ICD-10-CM

## 2021-03-01 ENCOUNTER — Encounter: Payer: Self-pay | Admitting: Obstetrics & Gynecology

## 2021-03-01 ENCOUNTER — Ambulatory Visit (INDEPENDENT_AMBULATORY_CARE_PROVIDER_SITE_OTHER): Payer: No Typology Code available for payment source

## 2021-03-01 ENCOUNTER — Ambulatory Visit (INDEPENDENT_AMBULATORY_CARE_PROVIDER_SITE_OTHER): Payer: No Typology Code available for payment source | Admitting: Obstetrics & Gynecology

## 2021-03-01 ENCOUNTER — Other Ambulatory Visit: Payer: No Typology Code available for payment source

## 2021-03-01 ENCOUNTER — Encounter (HOSPITAL_COMMUNITY): Payer: Self-pay

## 2021-03-01 ENCOUNTER — Other Ambulatory Visit: Payer: Self-pay

## 2021-03-01 ENCOUNTER — Other Ambulatory Visit (HOSPITAL_COMMUNITY)
Admission: RE | Admit: 2021-03-01 | Discharge: 2021-03-01 | Disposition: A | Discharge status: Home / Self Care | Payer: No Typology Code available for payment source | Source: Ambulatory Visit | Attending: Obstetrics & Gynecology | Admitting: Obstetrics & Gynecology

## 2021-03-01 VITALS — BP 137/85 | HR 79 | Wt 194.0 lb

## 2021-03-01 DIAGNOSIS — O0993 Supervision of high risk pregnancy, unspecified, third trimester: Secondary | ICD-10-CM

## 2021-03-01 DIAGNOSIS — Z98891 History of uterine scar from previous surgery: Secondary | ICD-10-CM

## 2021-03-01 DIAGNOSIS — Q270 Congenital absence and hypoplasia of umbilical artery: Secondary | ICD-10-CM

## 2021-03-01 DIAGNOSIS — Z3A36 36 weeks gestation of pregnancy: Secondary | ICD-10-CM | POA: Insufficient documentation

## 2021-03-01 DIAGNOSIS — O30043 Twin pregnancy, dichorionic/diamniotic, third trimester: Secondary | ICD-10-CM | POA: Insufficient documentation

## 2021-03-01 DIAGNOSIS — Z1389 Encounter for screening for other disorder: Secondary | ICD-10-CM

## 2021-03-01 DIAGNOSIS — Z8759 Personal history of other complications of pregnancy, childbirth and the puerperium: Secondary | ICD-10-CM

## 2021-03-01 LAB — POCT URINALYSIS DIPSTICK OB
Blood, UA: NEGATIVE
Glucose, UA: NEGATIVE
Leukocytes, UA: NEGATIVE
Nitrite, UA: NEGATIVE

## 2021-03-01 NOTE — Patient Instructions (Signed)
Molly Burton  03/01/2021   Your procedure is scheduled on:  4/18/20220  Arrive at 530 at Entrance C on [address] at [facility]  and [employer]. You are invited to use the FREE valet parking or use the Visitor's parking deck.  Pick up the phone at the desk and dial [phone].  Call this number if you have problems the morning of surgery: [phone]  Remember:   Do not eat food:(After Midnight) [place] median[place].  Do not drink clear liquids: (After Midnight) Desps de medianoche.  Take these medicines the morning of surgery with A SIP OF WATER:  none   Do not wear jewelry, make-up or nail polish.  Do not wear lotions, powders, or perfumes. Do not wear deodorant.  Do not shave 48 hours prior to surgery.  Do not bring valuables to the hospital.  [facility] is not   responsible for any belongings or valuables brought to the hospital.  Contacts, dentures or bridgework may not be worn into surgery.  Leave suitcase in the car. After surgery it may be brought to your room.  For patients admitted to the hospital, checkout time is 11:00 AM the day of              discharge.      Please read over the following fact sheets that you were given:     Preparing for Surgery

## 2021-03-01 NOTE — Progress Notes (Unsigned)
[name] DI/DI twins 36+4 wks BABY A: female,cephalic right,posterior placenta gr 1,fhr 135 bpm,6/8 no breathing,svp of fluid 5.5 cm,EFW 3053 g 62% BABY B: female,cephalic left superior,anterior placenta gr 1,fhr 142 bpm,SUA,BPP 8/8,svp of fluid 7.1 cm,EFW 2890 g 45%,discordance 5.4%

## 2021-03-01 NOTE — Progress Notes (Signed)
HIGH-RISK PREGNANCY VISIT Patient name: Molly Burton MRN [id number]  Date of birth: Nov 04, 1996 Chief Complaint:   Routine Prenatal Visit, High Risk Gestation, and Pregnancy Ultrasound  History of Present Illness:   Molly Burton is a 25 y.o. G64P1001 female at [redacted]w[redacted]d with an Estimated Date of Delivery: 03/25/21 being seen today for ongoing management of a high-risk pregnancy complicated by DiDi twins, twin B with SUA.  Today she reports no complaints.  Depression screen [facility] 2/9 09/16/2020 08/01/2018  Decreased Interest 3 0  Down, Depressed, Hopeless 0 0  PHQ - 2 Score 3 0  Altered sleeping 0 0  Tired, decreased energy 3 1  Change in appetite 0 1  Feeling bad or failure about yourself  0 0  Trouble concentrating 0 0  Moving slowly or fidgety/restless 0 0  Suicidal thoughts 0 0  PHQ-9 Score 6 2    Contractions: Irritability. Vag. Bleeding: None.  Movement: Present. denies leaking of fluid.  Review of Systems:   Pertinent items are noted in HPI Denies abnormal vaginal discharge w/ itching/odor/irritation, headaches, visual changes, shortness of breath, chest pain, abdominal pain, severe nausea/vomiting, or problems with urination or bowel movements unless otherwise stated above. Pertinent History Reviewed:  Reviewed past medical,surgical, social, obstetrical and family history.  Reviewed problem list, medications and allergies. Physical Assessment:   Vitals:   03/01/21 1645  BP: 137/85  Pulse: 79  Weight: 194 lb (88 kg)  Body mass index is 34.37 kg/m.           Physical Examination:   General appearance: alert, well appearing, and in no distress  Mental status: alert, oriented to person, place, and time  Skin: warm & dry   Extremities: Edema: Trace    Cardiovascular: normal heart rate noted  Respiratory: normal respiratory effort, no distress  Abdomen: gravid, soft, non-tender  Pelvic: Cervical exam deferred  Dilation: Closed Effacement (%): Thick    Fetal Status:  Fetal Heart Rate (bpm): 142/135   Movement: Present Presentation: Vertex  Fetal Surveillance Testing today: Twin A 6/8, B 8/8 equal growth   Chaperone: Clint Bolder    Results for orders placed or performed in visit on 03/01/21 (from the past 24 hour(s))  POC Urinalysis Dipstick OB   Collection Time: 03/01/21  4:27 PM  Result Value Ref Range   Color, UA     Clarity, UA     Glucose, UA Negative Negative   Bilirubin, UA     Ketones, UA large    Spec Grav, UA     Blood, UA neg    pH, UA     POC,PROTEIN,UA Trace Negative, Trace, Small (1+), Moderate (2+), Large (3+), 4+   Urobilinogen, UA     Nitrite, UA neg    Leukocytes, UA Negative Negative   Appearance     Odor      Assessment & Plan:  High-risk pregnancy: G2P1001 at [redacted]w[redacted]d with an Estimated Date of Delivery: 03/25/21   1) DiDi twins, twin A 6/8 BPP no sustained breathing, NST tomorrow am,   2) Twin B SUA,   Meds: No orders of the defined types were placed in this encounter.   Labs/procedures today: U/S  Treatment Plan:  NST in am  Reviewed: Term labor symptoms and general obstetric precautions including but not limited to vaginal bleeding, contractions, leaking of fluid and fetal movement were reviewed in detail with the patient.  All questions were answered. Does have home bp cuff. Office bp cuff given: not  applicable. Check bp daily, let us know if consistently >140 and/or >90.  Follow-up: Return in about 1 day (around 03/02/2021) for 0930 NST, nurse visit.   Future Appointments  Date Time Provider Department Center  03/08/2021  2:00 PM [facility] - FTO[facility]YN [name] [facility] None  03/08/2021  3:10 PM [name], DO [facility] FTOBGYN  03/12/2021  9:00 AM [facility]-LD PAT 1 [facility]-INDC None  03/12/2021 10:00 AM [facility]-SCREENING [facility]SDSC None    Orders Placed This Encounter  Procedures  . Culture, beta strep (group b only)  . POC Urinalysis Dipstick OB   Lazaro Arms  03/01/2021 5:13 PM

## 2021-03-02 ENCOUNTER — Ambulatory Visit (INDEPENDENT_AMBULATORY_CARE_PROVIDER_SITE_OTHER): Payer: No Typology Code available for payment source | Admitting: *Deleted

## 2021-03-02 DIAGNOSIS — O0993 Supervision of high risk pregnancy, unspecified, third trimester: Secondary | ICD-10-CM | POA: Diagnosis not present

## 2021-03-02 DIAGNOSIS — O30043 Twin pregnancy, dichorionic/diamniotic, third trimester: Secondary | ICD-10-CM

## 2021-03-02 NOTE — Progress Notes (Addendum)
   NURSE VISIT- NST  SUBJECTIVE:  Molly Burton is a 25 y.o. G2P1001 female at [redacted]w[redacted]d, here for a NST for pregnancy complicated by Multiple gestation.  She reports active fetal movement, contractions: none, vaginal bleeding: none, membranes: intact.   OBJECTIVE:  BP 131/77   Pulse 85   LMP 06/18/2020   Appears well, no apparent distress  Results for orders placed or performed in visit on 03/01/21 (from the past 24 hour(s))  POC Urinalysis Dipstick OB   Collection Time: 03/01/21  4:27 PM  Result Value Ref Range   Color, UA     Clarity, UA     Glucose, UA Negative Negative   Bilirubin, UA     Ketones, UA large    Spec Grav, UA     Blood, UA neg    pH, UA     POC,PROTEIN,UA Trace Negative, Trace, Small (1+), Moderate (2+), Large (3+), 4+   Urobilinogen, UA     Nitrite, UA neg    Leukocytes, UA Negative Negative   Appearance     Odor      NST: FHR baseline 140 bpm, Variability: moderate, Accelerations:present, Decelerations:  Absent= Cat 1/Reactive Toco: occasional    ASSESSMENT: G2P1001 at [redacted]w[redacted]d with Multiple gestation NST reactive  PLAN: EFM strip reviewed by Dr. Despina Hidden   Recommendations: keep next appointment as scheduled    Jobe Marker  03/02/2021 12:24 PM   Attestation of Attending Supervision of Nursing Visit Encounter: Evaluation and management procedures were performed by the nursing staff under my supervision and collaboration.  I have reviewed the nurse's note and chart, and I agree with the management and plan.  Rockne Coons MD Attending Physician for the Center for [facility] 03/02/2021 5:16 PM

## 2021-03-03 ENCOUNTER — Telehealth: Payer: Self-pay | Admitting: *Deleted

## 2021-03-03 LAB — CERVICOVAGINAL ANCILLARY ONLY
Chlamydia: NEGATIVE
Comment: NEGATIVE
Comment: NORMAL
Neisseria Gonorrhea: NEGATIVE

## 2021-03-03 NOTE — Telephone Encounter (Signed)
Patient called with questions regarding baby A getting 6/8 on BPP on Tuesday.  She came yesterday for NST but patient does not remember what Dr Despina Hidden said in regards to that.  Informed patient that since it was 6/8, an NST needed to be done within 24 hours of U/S which was why she came and baby A was very active on the monitor. Pt verbalized understanding with all questions answered.

## 2021-03-03 NOTE — Telephone Encounter (Signed)
Patient wanted to discuss ultrasound results with Tish. Clinical staff will follow up with patient.

## 2021-03-05 ENCOUNTER — Other Ambulatory Visit: Payer: Self-pay | Admitting: Obstetrics & Gynecology

## 2021-03-05 DIAGNOSIS — O30043 Twin pregnancy, dichorionic/diamniotic, third trimester: Secondary | ICD-10-CM

## 2021-03-06 LAB — CULTURE, BETA STREP (GROUP B ONLY): Strep Gp B Culture: NEGATIVE

## 2021-03-08 ENCOUNTER — Encounter (HOSPITAL_COMMUNITY): Payer: Self-pay | Admitting: Family Medicine

## 2021-03-08 ENCOUNTER — Inpatient Hospital Stay (HOSPITAL_COMMUNITY): Payer: No Typology Code available for payment source | Admitting: Anesthesiology

## 2021-03-08 ENCOUNTER — Encounter (HOSPITAL_COMMUNITY)
Admission: AD | Disposition: A | Discharge status: Home / Self Care | Payer: Self-pay | Source: Home / Self Care | Attending: Obstetrics & Gynecology

## 2021-03-08 ENCOUNTER — Other Ambulatory Visit: Payer: Self-pay

## 2021-03-08 ENCOUNTER — Ambulatory Visit (INDEPENDENT_AMBULATORY_CARE_PROVIDER_SITE_OTHER): Payer: No Typology Code available for payment source | Admitting: Obstetrics & Gynecology

## 2021-03-08 ENCOUNTER — Inpatient Hospital Stay (HOSPITAL_COMMUNITY)
Admission: AD | Admit: 2021-03-08 | Discharge: 2021-03-10 | DRG: 787 | Disposition: A | Discharge status: Home / Self Care | Payer: No Typology Code available for payment source | Attending: Obstetrics & Gynecology | Admitting: Obstetrics & Gynecology

## 2021-03-08 ENCOUNTER — Ambulatory Visit (INDEPENDENT_AMBULATORY_CARE_PROVIDER_SITE_OTHER): Payer: No Typology Code available for payment source

## 2021-03-08 ENCOUNTER — Encounter: Payer: Self-pay | Admitting: Obstetrics & Gynecology

## 2021-03-08 VITALS — BP 144/92 | HR 75 | Wt 199.4 lb

## 2021-03-08 DIAGNOSIS — Z20822 Contact with and (suspected) exposure to covid-19: Secondary | ICD-10-CM | POA: Diagnosis present

## 2021-03-08 DIAGNOSIS — Z3A00 Weeks of gestation of pregnancy not specified: Secondary | ICD-10-CM | POA: Diagnosis not present

## 2021-03-08 DIAGNOSIS — O0990 Supervision of high risk pregnancy, unspecified, unspecified trimester: Secondary | ICD-10-CM

## 2021-03-08 DIAGNOSIS — O30043 Twin pregnancy, dichorionic/diamniotic, third trimester: Secondary | ICD-10-CM

## 2021-03-08 DIAGNOSIS — Z98891 History of uterine scar from previous surgery: Secondary | ICD-10-CM

## 2021-03-08 DIAGNOSIS — Z3A37 37 weeks gestation of pregnancy: Secondary | ICD-10-CM

## 2021-03-08 DIAGNOSIS — O34211 Maternal care for low transverse scar from previous cesarean delivery: Principal | ICD-10-CM | POA: Diagnosis present

## 2021-03-08 DIAGNOSIS — O0993 Supervision of high risk pregnancy, unspecified, third trimester: Secondary | ICD-10-CM

## 2021-03-08 DIAGNOSIS — Z8759 Personal history of other complications of pregnancy, childbirth and the puerperium: Secondary | ICD-10-CM

## 2021-03-08 DIAGNOSIS — O1404 Mild to moderate pre-eclampsia, complicating childbirth: Secondary | ICD-10-CM | POA: Diagnosis present

## 2021-03-08 DIAGNOSIS — D62 Acute posthemorrhagic anemia: Secondary | ICD-10-CM | POA: Diagnosis not present

## 2021-03-08 DIAGNOSIS — Q270 Congenital absence and hypoplasia of umbilical artery: Secondary | ICD-10-CM

## 2021-03-08 DIAGNOSIS — R030 Elevated blood-pressure reading, without diagnosis of hypertension: Secondary | ICD-10-CM | POA: Diagnosis present

## 2021-03-08 DIAGNOSIS — O134 Gestational [pregnancy-induced] hypertension without significant proteinuria, complicating childbirth: Secondary | ICD-10-CM | POA: Diagnosis not present

## 2021-03-08 DIAGNOSIS — O30049 Twin pregnancy, dichorionic/diamniotic, unspecified trimester: Secondary | ICD-10-CM | POA: Diagnosis present

## 2021-03-08 DIAGNOSIS — E0590 Thyrotoxicosis, unspecified without thyrotoxic crisis or storm: Secondary | ICD-10-CM | POA: Diagnosis present

## 2021-03-08 DIAGNOSIS — O9081 Anemia of the puerperium: Secondary | ICD-10-CM | POA: Diagnosis not present

## 2021-03-08 DIAGNOSIS — O1400 Mild to moderate pre-eclampsia, unspecified trimester: Secondary | ICD-10-CM | POA: Diagnosis present

## 2021-03-08 LAB — COMPREHENSIVE METABOLIC PANEL
ALT: 20 U/L (ref 0–44)
AST: 25 U/L (ref 15–41)
Albumin: 2.8 g/dL — ABNORMAL LOW (ref 3.5–5.0)
Alkaline Phosphatase: 143 U/L — ABNORMAL HIGH (ref 38–126)
Anion gap: 8 (ref 5–15)
BUN: <5 mg/dL — ABNORMAL LOW (ref 6–20)
CO2: 20 mmol/L — ABNORMAL LOW (ref 22–32)
Calcium: 8.4 mg/dL — ABNORMAL LOW (ref 8.9–10.3)
Chloride: 109 mmol/L (ref 98–111)
Creatinine, Ser: 0.69 mg/dL (ref 0.44–1.00)
GFR, Estimated: >60 mL/min (ref >60)
Glucose, Bld: 77 mg/dL (ref 70–99)
Potassium: 3.4 mmol/L — ABNORMAL LOW (ref 3.5–5.1)
Sodium: 137 mmol/L (ref 135–145)
Total Bilirubin: 0.4 mg/dL (ref 0.3–1.2)
Total Protein: 6.1 g/dL — ABNORMAL LOW (ref 6.5–8.1)

## 2021-03-08 LAB — CBC
HCT: 34.6 % — ABNORMAL LOW (ref 36.0–46.0)
Hemoglobin: 11.3 g/dL — ABNORMAL LOW (ref 12.0–15.0)
MCH: 30.7 pg (ref 26.0–34.0)
MCHC: 32.7 g/dL (ref 30.0–36.0)
MCV: 94 fL (ref 80.0–100.0)
Platelets: 210 10*3/uL (ref 150–400)
RBC: 3.68 MIL/uL — ABNORMAL LOW (ref 3.87–5.11)
RDW: 13.3 % (ref 11.5–15.5)
WBC: 10.7 10*3/uL — ABNORMAL HIGH (ref 4.0–10.5)
nRBC: 0 % (ref 0.0–0.2)

## 2021-03-08 LAB — POCT URINALYSIS DIPSTICK OB
Blood, UA: NEGATIVE
Glucose, UA: NEGATIVE
Ketones, UA: NEGATIVE
Leukocytes, UA: NEGATIVE
Nitrite, UA: NEGATIVE
POC,PROTEIN,UA: NEGATIVE

## 2021-03-08 LAB — RESP PANEL BY RT-PCR (FLU A&B, COVID) ARPGX2
Influenza A by PCR: NEGATIVE
Influenza B by PCR: NEGATIVE
SARS Coronavirus 2 by RT PCR: NEGATIVE

## 2021-03-08 LAB — TYPE AND SCREEN
ABO/RH(D): O POS
Antibody Screen: NEGATIVE

## 2021-03-08 LAB — PROTEIN / CREATININE RATIO, URINE
Creatinine, Urine: 73.21 mg/dL
Protein Creatinine Ratio: 0.36 mg/mg{Cre} — ABNORMAL HIGH (ref 0.00–0.15)
Total Protein, Urine: 26 mg/dL

## 2021-03-08 SURGERY — Surgical Case
Anesthesia: Spinal

## 2021-03-08 MED ORDER — TETANUS-DIPHTH-ACELL PERTUSSIS 5-2.5-18.5 LF-MCG/0.5 IM SUSY: 0.5000 mL | PREFILLED_SYRINGE | Freq: Once | INTRAMUSCULAR | Status: DC

## 2021-03-08 MED ORDER — FENTANYL CITRATE (PF) 100 MCG/2ML IJ SOLN
INTRAMUSCULAR | Status: DC | PRN
  Administered 2021-03-08: 15 ug via INTRATHECAL

## 2021-03-08 MED ORDER — SOD CITRATE-CITRIC ACID 500-334 MG/5ML PO SOLN
30.0000 mL | Freq: Once | ORAL | Status: AC
Start: 2021-03-08 — End: 2021-03-08
  Administered 2021-03-08: 30 mL via ORAL
  Filled 2021-03-08: qty 15

## 2021-03-08 MED ORDER — OXYTOCIN-SODIUM CHLORIDE 30-0.9 UT/500ML-% IV SOLN
INTRAVENOUS | Status: DC | PRN
  Administered 2021-03-08: 500 m[IU]/min via INTRAVENOUS

## 2021-03-08 MED ORDER — MEASLES, MUMPS & RUBELLA VAC IJ SOLR: 0.5000 mL | Freq: Once | INTRAMUSCULAR | Status: DC

## 2021-03-08 MED ORDER — MORPHINE SULFATE (PF) 0.5 MG/ML IJ SOLN
INTRAMUSCULAR | Status: AC
  Filled 2021-03-08: qty 10

## 2021-03-08 MED ORDER — NALBUPHINE HCL 10 MG/ML IJ SOLN: 5.0000 mg | Freq: Once | INTRAMUSCULAR | Status: DC | PRN

## 2021-03-08 MED ORDER — WITCH HAZEL-GLYCERIN EX PADS: 1.0000 "application " | MEDICATED_PAD | CUTANEOUS | Status: DC | PRN

## 2021-03-08 MED ORDER — NALBUPHINE HCL 10 MG/ML IJ SOLN: 5.0000 mg | INTRAMUSCULAR | Status: DC | PRN

## 2021-03-08 MED ORDER — LACTATED RINGERS IV BOLUS
1000.0000 mL | Freq: Once | INTRAVENOUS | Status: AC
  Administered 2021-03-08: 1000 mL via INTRAVENOUS

## 2021-03-08 MED ORDER — COCONUT OIL OIL
1.0000 "application " | TOPICAL_OIL | Status: DC | PRN
  Administered 2021-03-10: 1 via TOPICAL

## 2021-03-08 MED ORDER — KETOROLAC TROMETHAMINE 30 MG/ML IJ SOLN
30.0000 mg | Freq: Once | INTRAMUSCULAR | Status: AC | PRN
  Administered 2021-03-08: 30 mg via INTRAVENOUS

## 2021-03-08 MED ORDER — MENTHOL 3 MG MT LOZG: 1.0000 | LOZENGE | OROMUCOSAL | Status: DC | PRN

## 2021-03-08 MED ORDER — ENOXAPARIN SODIUM 40 MG/0.4ML ~~LOC~~ SOLN
40.0000 mg | SUBCUTANEOUS | Status: DC
  Administered 2021-03-09: 40 mg via SUBCUTANEOUS
  Filled 2021-03-08 (×2): qty 0.4

## 2021-03-08 MED ORDER — KETOROLAC TROMETHAMINE 30 MG/ML IJ SOLN
INTRAMUSCULAR | Status: AC
  Filled 2021-03-08: qty 1

## 2021-03-08 MED ORDER — IBUPROFEN 800 MG PO TABS
800.0000 mg | ORAL_TABLET | Freq: Four times a day (QID) | ORAL | Status: DC
  Administered 2021-03-10 (×2): 800 mg via ORAL
  Filled 2021-03-08 (×2): qty 1

## 2021-03-08 MED ORDER — SCOPOLAMINE 1 MG/3DAYS TD PT72
MEDICATED_PATCH | TRANSDERMAL | Status: AC
  Filled 2021-03-08: qty 1

## 2021-03-08 MED ORDER — BUPIVACAINE IN DEXTROSE 0.75-8.25 % IT SOLN
INTRATHECAL | Status: DC | PRN
  Administered 2021-03-08: 12.75 mg via INTRATHECAL

## 2021-03-08 MED ORDER — SIMETHICONE 80 MG PO CHEW
80.0000 mg | CHEWABLE_TABLET | Freq: Three times a day (TID) | ORAL | Status: DC
  Administered 2021-03-09 – 2021-03-10 (×4): 80 mg via ORAL
  Filled 2021-03-08 (×4): qty 1

## 2021-03-08 MED ORDER — DIBUCAINE (PERIANAL) 1 % EX OINT: 1.0000 "application " | TOPICAL_OINTMENT | CUTANEOUS | Status: DC | PRN

## 2021-03-08 MED ORDER — MORPHINE SULFATE (PF) 0.5 MG/ML IJ SOLN
INTRAMUSCULAR | Status: DC | PRN
  Administered 2021-03-08: 150 ug via INTRATHECAL

## 2021-03-08 MED ORDER — TRANEXAMIC ACID-NACL 1000-0.7 MG/100ML-% IV SOLN
INTRAVENOUS | Status: DC | PRN
  Administered 2021-03-08: 1000 mg via INTRAVENOUS

## 2021-03-08 MED ORDER — DEXAMETHASONE SODIUM PHOSPHATE 4 MG/ML IJ SOLN
INTRAMUSCULAR | Status: DC | PRN
  Administered 2021-03-08: 4 mg via INTRAVENOUS

## 2021-03-08 MED ORDER — LACTATED RINGERS IV SOLN: INTRAVENOUS | Status: DC

## 2021-03-08 MED ORDER — ONDANSETRON HCL 4 MG/2ML IJ SOLN: 4.0000 mg | Freq: Three times a day (TID) | INTRAMUSCULAR | Status: DC | PRN

## 2021-03-08 MED ORDER — KETOROLAC TROMETHAMINE 30 MG/ML IJ SOLN: 30.0000 mg | Freq: Once | INTRAMUSCULAR | Status: DC

## 2021-03-08 MED ORDER — OXYCODONE HCL 5 MG/5ML PO SOLN: 5.0000 mg | Freq: Once | ORAL | Status: DC | PRN

## 2021-03-08 MED ORDER — OXYCODONE HCL 5 MG PO TABS
5.0000 mg | ORAL_TABLET | ORAL | Status: DC | PRN
  Administered 2021-03-09: 5 mg via ORAL

## 2021-03-08 MED ORDER — ONDANSETRON HCL 4 MG/2ML IJ SOLN
INTRAMUSCULAR | Status: AC
  Filled 2021-03-08: qty 2

## 2021-03-08 MED ORDER — NALOXONE HCL 4 MG/10ML IJ SOLN
1.0000 ug/kg/h | INTRAVENOUS | Status: DC | PRN
  Filled 2021-03-08: qty 5

## 2021-03-08 MED ORDER — FENTANYL CITRATE (PF) 100 MCG/2ML IJ SOLN
INTRAMUSCULAR | Status: AC
  Filled 2021-03-08: qty 2

## 2021-03-08 MED ORDER — ACETAMINOPHEN 325 MG PO TABS: 650.0000 mg | ORAL_TABLET | ORAL | Status: DC | PRN

## 2021-03-08 MED ORDER — KETOROLAC TROMETHAMINE 30 MG/ML IJ SOLN
30.0000 mg | Freq: Four times a day (QID) | INTRAMUSCULAR | Status: AC
  Administered 2021-03-09 (×4): 30 mg via INTRAVENOUS
  Filled 2021-03-08 (×4): qty 1

## 2021-03-08 MED ORDER — NALBUPHINE HCL 10 MG/ML IJ SOLN
5.0000 mg | INTRAMUSCULAR | Status: DC | PRN
  Administered 2021-03-09: 5 mg via INTRAVENOUS

## 2021-03-08 MED ORDER — STERILE WATER FOR IRRIGATION IR SOLN
Status: DC | PRN
  Administered 2021-03-08: 1

## 2021-03-08 MED ORDER — SIMETHICONE 80 MG PO CHEW
80.0000 mg | CHEWABLE_TABLET | ORAL | Status: DC | PRN
Start: 2021-03-08 — End: 2021-03-10

## 2021-03-08 MED ORDER — PHENYLEPHRINE HCL-NACL 20-0.9 MG/250ML-% IV SOLN
INTRAVENOUS | Status: AC
  Filled 2021-03-08: qty 250

## 2021-03-08 MED ORDER — ONDANSETRON HCL 4 MG/2ML IJ SOLN: 4.0000 mg | Freq: Once | INTRAMUSCULAR | Status: DC | PRN

## 2021-03-08 MED ORDER — SENNOSIDES-DOCUSATE SODIUM 8.6-50 MG PO TABS
2.0000 | ORAL_TABLET | ORAL | Status: DC
  Administered 2021-03-09 – 2021-03-10 (×2): 2 via ORAL
  Filled 2021-03-08 (×2): qty 2

## 2021-03-08 MED ORDER — HYDROMORPHONE HCL 1 MG/ML IJ SOLN: 0.2500 mg | INTRAMUSCULAR | Status: DC | PRN

## 2021-03-08 MED ORDER — DIPHENHYDRAMINE HCL 25 MG PO CAPS
25.0000 mg | ORAL_CAPSULE | ORAL | Status: DC | PRN
  Filled 2021-03-08: qty 1

## 2021-03-08 MED ORDER — PHENYLEPHRINE HCL-NACL 20-0.9 MG/250ML-% IV SOLN
INTRAVENOUS | Status: DC | PRN
  Administered 2021-03-08: 60 ug/min via INTRAVENOUS

## 2021-03-08 MED ORDER — OXYCODONE HCL 5 MG PO TABS
5.0000 mg | ORAL_TABLET | Freq: Once | ORAL | Status: DC | PRN
Start: 2021-03-08 — End: 2021-03-08

## 2021-03-08 MED ORDER — SCOPOLAMINE 1 MG/3DAYS TD PT72
1.0000 | MEDICATED_PATCH | Freq: Once | TRANSDERMAL | Status: DC
  Administered 2021-03-09: 1.5 mg via TRANSDERMAL
  Filled 2021-03-08: qty 1

## 2021-03-08 MED ORDER — PRENATAL MULTIVITAMIN CH
1.0000 | ORAL_TABLET | Freq: Every day | ORAL | Status: DC
  Administered 2021-03-09 – 2021-03-10 (×2): 1 via ORAL
  Filled 2021-03-08 (×2): qty 1

## 2021-03-08 MED ORDER — DIPHENHYDRAMINE HCL 50 MG/ML IJ SOLN: 12.5000 mg | INTRAMUSCULAR | Status: DC | PRN

## 2021-03-08 MED ORDER — NALOXONE HCL 0.4 MG/ML IJ SOLN: 0.4000 mg | INTRAMUSCULAR | Status: DC | PRN

## 2021-03-08 MED ORDER — OXYTOCIN-SODIUM CHLORIDE 30-0.9 UT/500ML-% IV SOLN: 2.5000 [IU]/h | INTRAVENOUS | Status: AC

## 2021-03-08 MED ORDER — DIPHENHYDRAMINE HCL 25 MG PO CAPS
25.0000 mg | ORAL_CAPSULE | Freq: Four times a day (QID) | ORAL | Status: DC | PRN
  Administered 2021-03-09: 25 mg via ORAL

## 2021-03-08 MED ORDER — FAMOTIDINE IN NACL 20-0.9 MG/50ML-% IV SOLN
20.0000 mg | Freq: Once | INTRAVENOUS | Status: AC
  Administered 2021-03-08: 20 mg via INTRAVENOUS
  Filled 2021-03-08: qty 50

## 2021-03-08 MED ORDER — NALBUPHINE HCL 10 MG/ML IJ SOLN
5.0000 mg | Freq: Once | INTRAMUSCULAR | Status: DC | PRN
  Filled 2021-03-08: qty 1

## 2021-03-08 MED ORDER — ONDANSETRON HCL 4 MG/2ML IJ SOLN
INTRAMUSCULAR | Status: DC | PRN
  Administered 2021-03-08: 4 mg via INTRAVENOUS

## 2021-03-08 MED ORDER — DEXAMETHASONE SODIUM PHOSPHATE 10 MG/ML IJ SOLN
INTRAMUSCULAR | Status: AC
  Filled 2021-03-08: qty 1

## 2021-03-08 MED ORDER — CEFAZOLIN SODIUM-DEXTROSE 2-4 GM/100ML-% IV SOLN
2.0000 g | INTRAVENOUS | Status: AC
  Administered 2021-03-08: 2 g via INTRAVENOUS
  Filled 2021-03-08: qty 100

## 2021-03-08 MED ORDER — OXYTOCIN-SODIUM CHLORIDE 30-0.9 UT/500ML-% IV SOLN
INTRAVENOUS | Status: AC
  Filled 2021-03-08: qty 500

## 2021-03-08 MED ORDER — SODIUM CHLORIDE 0.9% FLUSH: 3.0000 mL | INTRAVENOUS | Status: DC | PRN

## 2021-03-08 SURGICAL SUPPLY — 41 items
ADH SKN CLS APL DERMABOND .7 (GAUZE/BANDAGES/DRESSINGS) ×2
CHLORAPREP W/TINT 26ML (MISCELLANEOUS) ×2 IMPLANT
CLAMP CORD UMBIL (MISCELLANEOUS) IMPLANT
CLOTH BEACON ORANGE TIMEOUT ST (SAFETY) ×2 IMPLANT
DERMABOND ADVANCED (GAUZE/BANDAGES/DRESSINGS) ×2
DERMABOND ADVANCED .7 DNX12 (GAUZE/BANDAGES/DRESSINGS) ×2 IMPLANT
DRSG OPSITE POSTOP 4X10 (GAUZE/BANDAGES/DRESSINGS) ×2 IMPLANT
ELECT REM PT RETURN 9FT ADLT (ELECTROSURGICAL) ×2
ELECTRODE REM PT RTRN 9FT ADLT (ELECTROSURGICAL) ×1 IMPLANT
EXTRACTOR VACUUM BELL STYLE (SUCTIONS) IMPLANT
GLOVE BIOGEL PI IND STRL 7.0 (GLOVE) ×1 IMPLANT
GLOVE BIOGEL PI IND STRL 8 (GLOVE) ×1 IMPLANT
GLOVE BIOGEL PI INDICATOR 7.0 (GLOVE) ×1
GLOVE BIOGEL PI INDICATOR 8 (GLOVE) ×1
GLOVE ECLIPSE 8.0 STRL XLNG CF (GLOVE) ×2 IMPLANT
GOWN STRL REUS W/TWL LRG LVL3 (GOWN DISPOSABLE) ×4 IMPLANT
KIT ABG SYR 3ML LUER SLIP (SYRINGE) ×2 IMPLANT
NDL HYPO 25X5/8 SAFETYGLIDE (NEEDLE) ×1 IMPLANT
NEEDLE HYPO 18GX1.5 BLUNT FILL (NEEDLE) ×2 IMPLANT
NEEDLE HYPO 22GX1.5 SAFETY (NEEDLE) ×2 IMPLANT
NEEDLE HYPO 25X5/8 SAFETYGLIDE (NEEDLE) ×2 IMPLANT
NS IRRIG 1000ML POUR BTL (IV SOLUTION) ×2 IMPLANT
PACK C SECTION WH (CUSTOM PROCEDURE TRAY) ×2 IMPLANT
PAD ABD 7.5X8 STRL (GAUZE/BANDAGES/DRESSINGS) ×2 IMPLANT
PAD OB MATERNITY 4.3X12.25 (PERSONAL CARE ITEMS) ×2 IMPLANT
PENCIL SMOKE EVAC W/HOLSTER (ELECTROSURGICAL) ×2 IMPLANT
RTRCTR C-SECT PINK 25CM LRG (MISCELLANEOUS) IMPLANT
SPONGE GAUZE 4X4 12PLY STER LF (GAUZE/BANDAGES/DRESSINGS) ×4 IMPLANT
SUT CHROMIC 0 CT 1 (SUTURE) ×2 IMPLANT
SUT MNCRL 0 VIOLET CTX 36 (SUTURE) ×2 IMPLANT
SUT MONOCRYL 0 CTX 36 (SUTURE) ×4
SUT PLAIN 2 0 (SUTURE)
SUT PLAIN 2 0 XLH (SUTURE) IMPLANT
SUT PLAIN ABS 2-0 CT1 27XMFL (SUTURE) IMPLANT
SUT VIC AB 0 CTX 36 (SUTURE) ×2
SUT VIC AB 0 CTX36XBRD ANBCTRL (SUTURE) ×1 IMPLANT
SUT VIC AB 4-0 KS 27 (SUTURE) IMPLANT
SYR 20CC LL (SYRINGE) ×4 IMPLANT
TOWEL OR 17X24 6PK STRL BLUE (TOWEL DISPOSABLE) ×2 IMPLANT
TRAY FOLEY W/BAG SLVR 14FR LF (SET/KITS/TRAYS/PACK) IMPLANT
WATER STERILE IRR 1000ML POUR (IV SOLUTION) ×2 IMPLANT

## 2021-03-08 NOTE — Anesthesia Postprocedure Evaluation (Signed)
Anesthesia Post Note  Patient: Molly Burton  Procedure(s) Performed: CESAREAN SECTION MULTI-GESTATIONAL (N/A )     Patient location during evaluation: PACU Anesthesia Type: Spinal Level of consciousness: awake and alert and oriented Pain management: pain level controlled Vital Signs Assessment: post-procedure vital signs reviewed and stable Respiratory status: spontaneous breathing, nonlabored ventilation and respiratory function stable Cardiovascular status: blood pressure returned to baseline and stable Postop Assessment: no headache, no backache, spinal receding and no apparent nausea or vomiting Anesthetic complications: no   No complications documented.  Last Vitals:  Vitals:   03/08/21 2100 03/08/21 2115  BP: 123/88 (!) 131/94  Pulse: 87   Resp: 12 19  Temp:    SpO2: 100%     Last Pain:  Vitals:   03/08/21 2115  TempSrc:   PainSc: 0-No pain    LLE Motor Response: No movement due to regional block (03/08/21 2115) LLE Sensation: Tingling (03/08/21 2115) RLE Motor Response: No movement due to regional block (03/08/21 2115) RLE Sensation: Tingling (03/08/21 2115)      Lannie Fields

## 2021-03-08 NOTE — Anesthesia Preprocedure Evaluation (Signed)
Anesthesia Evaluation  Patient identified by MRN, date of birth, ID band Patient awake    Reviewed: Allergy & Precautions, NPO status , Patient's Chart, lab work & pertinent test results  Airway Mallampati: II  TM Distance: >3 FB Neck ROM: Full    Dental no notable dental hx. (+) Teeth Intact, Dental Advisory Given   Pulmonary neg pulmonary ROS,    Pulmonary exam normal breath sounds clear to auscultation       Cardiovascular hypertension (gest HTN), Pt. on medications Normal cardiovascular exam Rhythm:Regular Rate:Normal     Neuro/Psych negative neurological ROS  negative psych ROS   GI/Hepatic negative GI ROS, Neg liver ROS,   Endo/Other    Renal/GU      Musculoskeletal   Abdominal   Peds  Hematology negative hematology ROS (+) anemia , Lab Results      Component                Value               Date                      WBC                      11.1 (H)            01/12/2021                HGB                      10.6 (L)            01/12/2021                HCT                      31.3 (L)            01/12/2021                MCV                      92                  01/12/2021                PLT                      190                 01/12/2021              Anesthesia Other Findings   Reproductive/Obstetrics (+) Pregnancy Twins                             Anesthesia Physical Anesthesia Plan  ASA: III  Anesthesia Plan: Spinal   Post-op Pain Management:    Induction:   PONV Risk Score and Plan: 3 and Treatment may vary due to age or medical condition and Ondansetron  Airway Management Planned: Natural Airway  Additional Equipment: None  Intra-op Plan:   Post-operative Plan:   Informed Consent: I have reviewed the patients History and Physical, chart, labs and discussed the procedure including the risks, benefits and alternatives for the proposed anesthesia  with the patient or authorized representative who has indicated his/her understanding and acceptance.  Dental advisory given  Plan Discussed with: CRNA and Anesthesiologist  Anesthesia Plan Comments: ( C/s under spinal for twins for [facility])        Anesthesia Quick Evaluation

## 2021-03-08 NOTE — Discharge Summary (Addendum)
Postpartum Discharge Summary     Patient Name: Molly Burton DOB: 11-30-1995 MRN: [id number]  Date of admission: 03/08/2021 Delivery date:   Rishita, Petron [[id number]]  03/08/2021    Tocara, Mennen [place] [[id number]]  03/08/2021   Delivering provider:    Nancee Liter Solene [[id number]]  Castalia, Willits, Dexter [[id number]]  Tania Ade H   Date of discharge: 03/10/2021  Admitting diagnosis: Cesarean delivery delivered [O82] Intrauterine pregnancy: [redacted]w[redacted]d    Secondary diagnosis:  Active Problems:   Hyperthyroidism   History of cesarean delivery   Supervision of high-risk pregnancy   Dichorionic diamniotic twin pregnancy   Mild preeclampsia   Single umbilical artery   Cesarean delivery delivered   Acute blood loss anemia  Additional problems: none    Discharge diagnosis: Term Pregnancy Delivered and Preeclampsia (mild)                                              Post partum procedures:None Augmentation: N/A Complications: None  Hospital course: Sceduled C/S   25y.o. yo G2P1001 at 342w4das admitted to the hospital 03/08/2021 for scheduled cesarean section with the following indication:Elective Repeat. Patient presented to MAU 03/08/21 for elevated blood pressure and was found to have p/c ratio 0.36, meeting criteria for pre-eclampsia. Patient declined vaginal attempt despite cephalic/cephalic presentation of twins. Decision made to proceed with rLTCS at that time. Delivery details are as follows:  Membrane Rupture Time/Date:    StBrecklynn, Jian0[[id number]]8:[place]0[[id number]]8:14 PM  ,   StCaryle, Helgeson0[[id number]]03/08/2021    StRai, Sinagra0[[id number]]03/08/2021    Delivery Method:   StKennis, Buell0[[id number]]C-Section, Low Transverse    StArienna, Benegasestiny [[id number]]C-Section, Low Transverse   Details of operation can be found in separate operative note.  Patient had an  uncomplicated postpartum course. Her blood pressures were normotensive and she was not started on blood pressure medications at discharge. Due to history of preeclampsia, instructed to follow up in one week for BP check. She is ambulating, tolerating a regular diet, passing flatus, and urinating well. Patient is discharged home in stable condition on  03/10/21        Newborn Data: Birth date:   StArlissa, Monteverde0[[id number]]03/08/2021    StShantia, Sanfordestiny [[id number]]03/08/2021   Birth time:   StDelphine, Siz[place]ore0[[id number]]8:[place]    StStacee, Earpestiny [[id number]]8:14 PM   Gender:   StYing, Blankenhorn0[[id number]]Female    StJanelie, Goltz0[[id number]]Female   Living status:   StOcean, Kearley0[[id number]]  BMSXJD    BZMCEY, EMVVestiny [[id number]]Living   Apgars:   StTeletha, Petrea0[[id number]]8 [address] [0[[id number]]8 [place],   StSheretha, Shadd0[[id number]]9 [address] [0[[id number]]9   Weight:   StLaural, Eiland0[[id number]]  1537    StNusaybah, Ivieestiny [0[[id number]]2761 g      Magnesium Sulfate received: No BMZ received: No Rhophylac:N/A MMR:N/A T-DaP: Not given   Physical exam  Vitals:   03/09/21 1351 03/09/21 1424 03/09/21 2027 03/10/21 0533  BP: 120/71 120/86 118/79 118/78  Pulse: 70 60 60 66  Resp: _0 Temp: 99.5 F (37.5 C) 98.3 F (36.8 C) 98.4 F (36.9 C) 98 F (36.7 C)  TempSrc: Oral Oral Oral   SpO2: 99% 99% 99%    General: alert, cooperative and no distress Lochia: appropriate Uterine Fundus: firm Incision: Healing well with no significant drainage DVT Evaluation: No evidence of DVT seen on physical exam. Labs: Lab Results  Component Value Date   WBC 14.9 (H) 03/09/2021   HGB 9.8 (L) 03/09/2021   HCT 29.6 (L) 03/09/2021   MCV 93.4 03/09/2021   PLT 187 03/09/2021   CMP Latest Ref Rng & Units 03/08/2021  Glucose 70 - 99 mg/dL 77   BUN 6 - 20 mg/dL <5(L)  Creatinine 0.44 - 1.00 mg/dL 0.69  Sodium 135 - 145 mmol/L 137  Potassium 3.5 - 5.1 mmol/L 3.4(L)  Chloride 98 - 111 mmol/L 109  CO2 22 - 32 mmol/L 20(L)  Calcium 8.9 - 10.3 mg/dL 8.4(L)  Total Protein 6.5 - 8.1 g/dL 6.1(L)  Total Bilirubin 0.3 - 1.2 mg/dL 0.4  Alkaline Phos 38 - 126 U/L 143(H)  AST 15 - 41 U/L 25  ALT 0 - 44 U/L 20   Edinburgh Score: Edinburgh Postnatal Depression Scale Screening Tool 03/09/2021  I have been able to laugh and see the funny side of things. 0  I have looked forward with enjoyment to things. 0  I have blamed myself unnecessarily when things went wrong. 0  I have been anxious or worried for no good reason. 1  I have felt scared or panicky for no good reason. 1  Things have been getting on top of me. 0  I have been so unhappy that I have had difficulty sleeping. 0  I have felt sad or miserable. 0  I have been so unhappy that I have been crying. 0  The thought of harming myself has occurred to me. 0  Edinburgh Postnatal Depression Scale Total 2     After visit meds:  Allergies as of 03/10/2021   No Known Allergies     Medication List    TAKE these medications   aspirin 81 MG chewable tablet Chew 2 tablets (162 mg total) by mouth daily.   CitraNatal Assure 35-1 & 300 MG tablet One tablet and one capsule daily What changed:   how much to take  how to take this  when to take this  additional instructions   Doxylamine-Pyridoxine 10-10 MG Tbec Commonly known as: Diclegis 2 tabs q hs, if sx persist add 1 tab q am on day 3, if sx persist add 1 tab q afternoon on day 4 What changed:   how much to take  how to take this  when to take this  additional instructions   ferrous sulfate 325 (65 FE) MG tablet Take 1 tablet (325 mg total) by mouth every other day.        Discharge home in stable condition Infant Feeding: Breast Infant Disposition:home with mother Discharge instruction: per After Visit  Summary and Postpartum booklet. Activity: Advance as tolerated. Pelvic rest for 6 weeks.  Diet: routine diet Future Appointments: Future Appointments  Date Time Provider [place]  03/18/2021 11:10 AM Janyth Pupa, DO [facility]  04/19/2021 11:10 AM Roma Schanz, CNM [facility]   Follow up Visit: Message sent to [facility] 03/08/21 by Sylvester Harder.   Please schedule this patient for a In person postpartum  visit in 6 weeks with the following provider: Any provider. Additional Postpartum F/U:Incision check 1 week and BP check 1 week  High risk pregnancy complicated by: HTN Delivery mode:     Taraoluwa, Thakur [[id number]]  C-Section, Low Transverse    Caidynce, Muzyka [[id number]]  C-Section, Low Transverse   Anticipated Birth Control:  Still deciding between IUD and Nexplanon outpatient  03/10/2021 Delora Fuel, MD  I saw and evaluated the patient. I agree with the findings and the plan of care as documented in the resident's note.  Sharene Skeans, MD [facility] Family Medicine Fellow, [facility] for [facility], [place]

## 2021-03-08 NOTE — MAU Provider Note (Signed)
History     CSN: [id number]  Arrival date and time: 03/08/21 1727     Chief Complaint  Patient presents with  . Hypertension   HPI This is a 25 yo G2P1001 at [redacted]w[redacted]d with pregnancy complicated by prior c/s, di/di twins, h/o GHTN on ASA. Was seen in office, had severe range BP. No headache, nause, vomiting, scotoma, RUQ pain. Good movement. NO contractions. NPO since 10am.  OB History    Gravida  2   Para  1   Term  1   Preterm      AB      Living  1     SAB      IAB      Ectopic      Multiple      Live Births  1           Past Medical History:  Diagnosis Date  . Chronic kidney disease    UTI  . Hypertension     Past Surgical History:  Procedure Laterality Date  . CESAREAN SECTION N/A 03/12/2019   Procedure: CESAREAN SECTION;  Surgeon: Levie Heritage, DO;  Location: [facility] LD [facility]S;  Service: Obstetrics;  Laterality: N/A;  . TONSILLECTOMY      Family History  Problem Relation Age of Onset  . Thyroid disease Maternal Grandmother   . Thyroid disease Paternal Grandmother     Social History   Tobacco Use  . Smoking status: Never Smoker  . Smokeless tobacco: Never Used  Vaping Use  . Vaping Use: Never used  Substance Use Topics  . Alcohol use: Not Currently    Comment: Pt did have wine on 08/05/2018  . Drug use: Not Currently    Types: Marijuana    Comment: not since 07/03/18    Allergies: No Known Allergies  Medications Prior to Admission  Medication Sig Dispense Refill Last Dose  . aspirin 81 MG chewable tablet Chew 2 tablets (162 mg total) by mouth daily. 60 tablet 7 Past Week at Unknown time  . ferrous sulfate 325 (65 FE) MG tablet Take 1 tablet (325 mg total) by mouth every other day. 45 tablet 2 Past Week at Unknown time  . Prenat w/o A-FeCbGl-DSS-FA-DHA (CITRANATAL ASSURE) 35-1 & 300 MG tablet One tablet and one capsule daily (Patient taking differently: Take 1 tablet by mouth daily.) 90 each 3 Past Week at Unknown time  .  Doxylamine-Pyridoxine (DICLEGIS) 10-10 MG TBEC 2 tabs q hs, if sx persist add 1 tab q am on day 3, if sx persist add 1 tab q afternoon on day 4 (Patient taking differently: Take 2 tablets by mouth at bedtime. if sx persist add 1 tab q am on day 3, if sx persist add 1 tab q afternoon on day 4) 100 tablet 6     Review of Systems Physical Exam   Blood pressure 139/89, pulse 82, temperature 98.2 F (36.8 C), temperature source Oral, resp. rate 18, last menstrual period 06/18/2020, SpO2 99 %, not currently breastfeeding.  Physical Exam Vitals reviewed. Exam conducted with a chaperone present.  Constitutional:      Appearance: Normal appearance.  HENT:     Head: Normocephalic and atraumatic.  Cardiovascular:     Rate and Rhythm: Normal rate and regular rhythm.     Pulses: Normal pulses.  Pulmonary:     Effort: Pulmonary effort is normal.  Abdominal:     General: Abdomen is flat.     Palpations: Abdomen is soft.  Skin:  General: Skin is warm and dry.     Capillary Refill: Capillary refill takes less than 2 seconds.  Neurological:     General: No focal deficit present.     Mental Status: She is alert.  Psychiatric:        Mood and Affect: Mood normal.        Behavior: Behavior normal.        Thought Content: Thought content normal.        Judgment: Judgment normal.     MAU Course  Procedures  MDM With elevated BP in office, indication for delivery with DI/DI twins between 37-38 weeks. Patient would like rpt c/s.   Assessment and Plan  The risks of cesarean section discussed with the patient included but were not limited to: bleeding which may require transfusion or reoperation; infection which may require antibiotics; injury to bowel, bladder, ureters or other surrounding organs; injury to the fetus; need for additional procedures including hysterectomy in the event of a life-threatening hemorrhage; placental abnormalities wth subsequent pregnancies, incisional problems,  thromboembolic phenomenon and other postoperative/anesthesia complications. The patient concurred with the proposed plan, giving informed written consent for the procedure.   Patient has been NPO since 10am she will remain NPO for procedure. Anesthesia and OR aware.  Preoperative prophylactic Ancef ordered on call to the OR.  To OR when ready.  Levie Heritage, DO 03/08/2021 6:05 PM     Levie Heritage 03/08/2021, 6:02 PM

## 2021-03-08 NOTE — Progress Notes (Signed)
HIGH-RISK PREGNANCY VISIT Patient name: Molly Burton MRN [id number]  Date of birth: 1996-04-08 Chief Complaint:   Routine Prenatal Visit ([name] today)  History of Present Illness:   Molly Burton is a 25 y.o. G35P1001 female at [redacted]w[redacted]d with an Estimated Date of Delivery: 03/25/21 being seen today for ongoing management of a high-risk pregnancy complicated by: 1) Di/di twins- twin B with SUA 2) h/o gestHTN 3) h/o C-section.    Today she reports pelvic pressure and dicomfort, but not anything different from her normal.   Contractions: Not present. Vag. Bleeding: None.  Movement: Present. denies leaking of fluid. Denies headache, blurry vision or RUQ pain. +LE edema- has been present for weeks  Depression screen [facility] 2/9 09/16/2020 08/01/2018  Decreased Interest 3 0  Down, Depressed, Hopeless 0 0  PHQ - 2 Score 3 0  Altered sleeping 0 0  Tired, decreased energy 3 1  Change in appetite 0 1  Feeling bad or failure about yourself  0 0  Trouble concentrating 0 0  Moving slowly or fidgety/restless 0 0  Suicidal thoughts 0 0  PHQ-9 Score 6 2     Current Outpatient Medications  Medication Instructions  . aspirin 162 mg, Oral, Daily  . Doxylamine-Pyridoxine (DICLEGIS) 10-10 MG TBEC 2 tabs q hs, if sx persist add 1 tab q am on day 3, if sx persist add 1 tab q afternoon on day 4  . ferrous sulfate 325 mg, Oral, Every other day  . Prenat w/o A-FeCbGl-DSS-FA-DHA (CITRANATAL ASSURE) 35-1 & 300 MG tablet One tablet and one capsule daily     Review of Systems:   Pertinent items are noted in HPI Denies abnormal vaginal discharge w/ itching/odor/irritation, headaches, visual changes, shortness of breath, chest pain, abdominal pain, severe nausea/vomiting, or problems with urination or bowel movements unless otherwise stated above. Pertinent History Reviewed:  Reviewed past medical,surgical, social, obstetrical and family history.  Reviewed problem list, medications and allergies. Physical  Assessment:   Vitals:   03/08/21 1456 03/08/21 1523 03/08/21 1524  BP: (!) 160/101 (!) 167/97 (!) 144/92  Pulse: 75    Weight: 199 lb 6.4 oz (90.4 kg)    Body mass index is 35.32 kg/m.  Repeat BP 140/90           Physical Examination:   General appearance: alert, well appearing, and in no distress  Mental status: alert, oriented to person, place, and time  Skin: warm & dry   Extremities: Edema: Mild pitting, slight indentation    Cardiovascular: normal heart rate noted  Respiratory: normal respiratory effort, no distress  Abdomen: gravid, soft, non-tender  Pelvic: Cervical exam deferred         Fetal Status:     Movement: Present    Fetal Surveillance Testing today:  BPP-  Twin A- vertex, 124, BPP 8/8, Twin B- vertex, 140, BPP 8/8   Chaperone: N/A    Results for orders placed or performed in visit on 03/08/21 (from the past 24 hour(s))  POC Urinalysis Dipstick OB   Collection Time: 03/08/21  3:02 PM  Result Value Ref Range   Color, UA     Clarity, UA     Glucose, UA Negative Negative   Bilirubin, UA     Ketones, UA neg    Spec Grav, UA     Blood, UA neg    pH, UA     POC,PROTEIN,UA Negative Negative, Trace, Small (1+), Moderate (2+), Large (3+), 4+   Urobilinogen, UA  Nitrite, UA neg    Leukocytes, UA Negative Negative   Appearance     Odor       Assessment & Plan:  High-risk pregnancy: G2P1001 at [redacted]w[redacted]d with an Estimated Date of Delivery: 03/25/21   1) Di/Di twin pregnancy with SUA of Twin B  2) gestational HTN- BP elevated today -pt sent to MAU for further evaluation/work up -discussed with patient likelihood of delivery today, last meal around 10am -taking ASA 162mg  daily -currently asymptomatic, UA neg for protein  3) h/o C-section- desires repeat  Meds: No orders of the defined types were placed in this encounter.   Labs/procedures today: none  Treatment Plan:  Pt sent to MAU for further evaluation/management  Reviewed: Term labor symptoms and  general obstetric precautions including but not limited to vaginal bleeding, contractions, leaking of fluid and fetal movement were reviewed in detail with the patient.  All questions were answered. Pt has home bp cuff. Check bp weekly, let know if >140/90.   Follow-up: If pt does not stay for delivery, discussed plan for repeat BP check on Thursday  Future Appointments  Date Time Provider Department Center  03/12/2021  9:00 AM [facility] PAT 1 [facility] None  03/12/2021 10:00 AM [facility]ING [facility] None    Orders Placed This Encounter  Procedures  . POC Urinalysis Dipstick OB    03/14/2021, DO Attending Obstetrician & Gynecologist, [facility] for [facility]

## 2021-03-08 NOTE — Op Note (Signed)
Cesarean Section Procedure Note   Molly Burton  03/08/2021  Indications: Elective repeat cesarean section, history of one prior cesarean delivery, declines vaginal attempt, twin gestation   Pre-operative Diagnosis: Twin gestation, inc BPs.   Post-operative Diagnosis: Same   Surgeon: Surgeon(s) and Role:    * Eure, Amaryllis Dyke, MD - Primary    * Alric Seton, MD - Assisting   Anesthesia: spinal    Estimated Blood Loss:352mL  Total IV Fluids:   Urine Output: 100CC OF clear urine  Specimens: placentas to pathology   Findings: Normal appearing uterus, fallopian tubes and ovaries bilaterally.  Baby condition / location: Twin A, viable female infant, APGARs 8;9, weight 3065g. Twin B, viable female infant, APGARs 8;9. Weight 2761g. Both twins to couplet care/skin to skin.   Complications: no complications  Indications: Molly Burton is a 25 y.o. G2P1001 with an IUP [redacted]w[redacted]d presenting with elevated blood pressure and protein/creatinine ratio 0.36. Patient with history of prior cesarean delivery for arrest of dilation and declines vaginal attempt.  The risks, benefits, complications, treatment options, and expected outcomes were discussed with the patient . The patient concurred with the proposed plan, giving informed consent. identified as Molly Burton and the procedure verified as C-Section Delivery.  Procedure Details: A Time Out was held and the above information confirmed.  The patient was taken back to the operative suite where spinal anesthesia was placed.  After induction of anesthesia, the patient was draped and prepped in the usual sterile manner and placed in a dorsal supine position with a leftward tilt. A transverse was made and carried down through the subcutaneous tissue to the fascia. Fascial incision was made and extended transversely. The fascia was separated from the underlying rectus tissue superiorly. The peritoneum was identified and entered.  Peritoneal incision was extended longitudinally. A low transverse uterine incision was made. Delivered from cephalic presentation was a 3065 gram living newborn infant female with Apgar scores of 8 at one minute and 9 at five minutes. After 1 minute umbilical cord of twin A was clamped and cut. Delivered from cephalic presentation was a 2761 gram Living newborn infant female with Apgar scores of 8 at one minute and 9 at five minutes. After 1 minute umbilical cord from twin B was clamped and cut. Cord ph was sent from both umbilical cords and marked appropriately. Cord blood was obtained from both umbilical cords and marked appropriately. The placentas were removed Intact and appeared normal. The placentas were sent to pathology in the setting of twin gestation with twin B having a single umbilical artery. The uterine outline, tubes and ovaries appeared normal. The uterine incision was closed with running locked sutures of 0-monocryl. A locked figure of eight stitch was placed to ensure hemostasis. Hemostasis was observed. The fascia was then reapproximated with running sutures of 0Vicryl. The skin was closed with 4-0Vicryl.   Instrument, sponge, and needle counts were correct prior the abdominal closure and were correct at the conclusion of the case.     Disposition: PACU - hemodynamically stable.   Maternal Condition: stable       Signed: Catalina Pizza 03/08/2021 8:55 PM

## 2021-03-08 NOTE — Transfer of Care (Signed)
Immediate Anesthesia Transfer of Care Note  Patient: Molly Burton  Procedure(s) Performed: CESAREAN SECTION MULTI-GESTATIONAL (N/A )  Patient Location: PACU  Anesthesia Type:Spinal  Level of Consciousness: awake, alert , oriented and patient cooperative  Airway & Oxygen Therapy: Patient Spontanous Breathing  Post-op Assessment: Report given to RN, Post -op Vital signs reviewed and stable and Patient moving all extremities X 4  Post vital signs: Reviewed and stable  Last Vitals:  Vitals Value Taken Time  BP 132/76 03/08/21 2053  Temp    Pulse 87 03/08/21 2054  Resp    SpO2 98 % 03/08/21 2054  Vitals shown include unvalidated device data.  Last Pain:  Vitals:   03/08/21 1750  TempSrc: Oral         Complications: No complications documented.

## 2021-03-08 NOTE — H&P (Signed)
Obstetric Preoperative History and Physical  Molly Burton is a 25 y.o. G2P1001 with IUP at [redacted]w[redacted]d presenting for elevated blood pressure readings in office. Patient has Di-Di Twins and has a history of c section.  Reports good fetal movement, no bleeding, no contractions, no leaking of fluid. No headache, nause, vomiting, scotoma, RUQ pain.  No acute preoperative concerns.    Cesarean Section Indication: history of prior c section, di-di twins   Prenatal Course [facility]  Pregnancy complications or risks: Patient Active Problem List   Diagnosis Date Noted  . 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 History of cesarean delivery 09/16/2020  . Supervision of high-risk pregnancy 09/16/2020  . Dichorionic diamniotic twin pregnancy 09/16/2020  . History of gestational hypertension 09/16/2020  . Arthralgia   . Hyperthyroidism 02/08/2018   She plans to breastfeed She desires unsure for postpartum contraception.   Prenatal labs and studies: ABO, Rh: --/--/PENDING (04/11 1742) Antibody: PENDING (04/11 1742) Rubella: 1.38 (10/20 1127) RPR: Non Reactive (02/15 0814)  HBsAg: Negative (10/20 1127)  HIV: Non Reactive (02/15 0814)  QZE:SPQZRAQT/-- (04/05 1338) 1 hr Glucola  Normal  Genetic screening normal Anatomy US normal, single umbilical artery in twin b   Prenatal Transfer Tool  Maternal Diabetes: No Genetic Screening: Normal Maternal Ultrasounds/Referrals: Normal Fetal Ultrasounds or other Referrals:  Referred to Materal Fetal Medicine  Maternal Substance Abuse:  No Significant Maternal Medications:  None Significant Maternal Lab Results: None  Past Medical History:  Diagnosis Date  . Chronic kidney disease    UTI  . Hypertension     Past Surgical History:  Procedure Laterality Date  . CESAREAN SECTION N/A 03/12/2019   Procedure: CESAREAN SECTION;  Surgeon: Levie Heritage, DO;  Location: [facility] LD [facility]S;  Service: Obstetrics;  Laterality: N/A;  . TONSILLECTOMY       OB History  Gravida Para Term Preterm AB Living  2 1 1     1   SAB IAB Ectopic Multiple Live Births          1    # Outcome Date GA Lbr Len/2nd Weight Sex Delivery Anes PTL Lv  2 Current           1 Term 03/12/19 [redacted]w[redacted]d  3841 g M CS-LTranv EPI  LIV     Complications: Fetal Intolerance    Social History   Socioeconomic History  . Marital status: Single    Spouse name: Not on file  . Number of children: 1  . Years of education: N/A  . Highest education level: Bachelor's degree (e.g., BA, AB, BS)  Occupational History  . Not on file  Tobacco Use  . Smoking status: Never Smoker  . Smokeless tobacco: Never Used  Vaping Use  . Vaping Use: Never used  Substance and Sexual Activity  . Alcohol use: Not Currently    Comment: Pt did have wine on 08/05/2018  . Drug use: Not Currently    Types: Marijuana    Comment: not since 07/03/18  . Sexual activity: Yes    Birth control/protection: None  Other Topics Concern  . Not on file  Social History Narrative  . Not on file   Social Determinants of Health   Financial Resource Strain: Low Risk   . Difficulty of Paying Living Expenses: Not hard at all  Food Insecurity: No Food Insecurity  . Worried About 09/02/18 in the Last Year: Never true  . Ran Out of Food in the Last Year:  Never true  Transportation Needs: No Transportation Needs  . Lack of Transportation (Medical): No  . Lack of Transportation (Non-Medical): No  Physical Activity: Inactive  . Days of Exercise per Week: 0 days  . Minutes of Exercise per Session: 0 min  Stress: No Stress Concern Present  . Feeling of Stress : Not at all  Social Connections: Moderately Isolated  . Frequency of Communication with Friends and Family: More than three times a week  . Frequency of Social Gatherings with Friends and Family: More than three times a week  . Attends Religious Services: 1 to 4 times per year  . Active Member of Clubs or Organizations: No  . Attends Occupational hygienist Meetings: Never  . Marital Status: Never married    Family History  Problem Relation Age of Onset  . Thyroid disease Maternal Grandmother   . Thyroid disease Paternal Grandmother     Medications Prior to Admission  Medication Sig Dispense Refill Last Dose  . aspirin 81 MG chewable tablet Chew 2 tablets (162 mg total) by mouth daily. 60 tablet 7 Past Week at Unknown time  . ferrous sulfate 325 (65 FE) MG tablet Take 1 tablet (325 mg total) by mouth every other day. 45 tablet 2 Past Week at Unknown time  . Prenat w/o A-FeCbGl-DSS-FA-DHA (CITRANATAL ASSURE) 35-1 & 300 MG tablet One tablet and one capsule daily (Patient taking differently: Take 1 tablet by mouth daily.) 90 each 3 Past Week at Unknown time  . Doxylamine-Pyridoxine (DICLEGIS) 10-10 MG TBEC 2 tabs q hs, if sx persist add 1 tab q am on day 3, if sx persist add 1 tab q afternoon on day 4 (Patient taking differently: Take 2 tablets by mouth at bedtime. if sx persist add 1 tab q am on day 3, if sx persist add 1 tab q afternoon on day 4) 100 tablet 6     No Known Allergies  Review of Systems: Pertinent items noted in HPI and remainder of comprehensive ROS otherwise negative.  Physical Exam: BP (!) 145/85   Pulse 73   Temp 98.2 F (36.8 C) (Oral)   Resp 18   LMP 06/18/2020   SpO2 99%  FHR by Doppler: Twin A 125 Twin B 135 bpm CONSTITUTIONAL: Well-developed, well-nourished female in no acute distress.  HENT:  Normocephalic, atraumatic NECK: Normal range of motion, supple, no masses SKIN: Skin is warm and dry. No rash noted. Not diaphoretic. No erythema. No pallor. NEUROLGIC: Alert and oriented to person, place, and time.  Marland Kitchen PSYCHIATRIC: Normal mood and affect.   CARDIOVASCULAR: Normal heart rate noted RESPIRATORY: Effort and breath sounds normal ABDOMEN: Soft, nontender, nondistended, gravid. Well-healed Pfannenstiel incision. PELVIC: Deferred MUSCULOSKELETAL: Normal range of motion.    Pertinent  Labs/Studies:   Results for orders placed or performed during the hospital encounter of 03/08/21 (from the past 72 hour(s))  Type and screen [facility]     Status: None (Preliminary result)   Collection Time: 03/08/21  5:42 PM  Result Value Ref Range   ABO/RH(D) PENDING    Antibody Screen PENDING    Sample Expiration      03/11/2021,2359 Performed at [facility], [address] [place], [place] [address]     Assessment and Plan: VADA SWIFT is a 25 y.o. G2P1001 at [redacted]w[redacted]d being admitted for scheduled cesarean section. The risks of cesarean section discussed with the patient included but were not limited to: bleeding which may require transfusion or  reoperation; infection which may require antibiotics; injury to bowel, bladder, ureters or other surrounding organs; injury to the fetus; need for additional procedures including hysterectomy in the event of a life-threatening hemorrhage; placental abnormalities with subsequent pregnancies, incisional problems, thromboembolic phenomenon and other postoperative/anesthesia complications. The patient concurred with the proposed plan, giving informed written consent for the procedure. Patient has been NPO since 10AM she will remain NPO for procedure. Anesthesia and OR aware. Preoperative prophylactic antibiotics and SCDs ordered on call to the OR. To OR when ready.   Pregnancy Complications:  #Di-Di Twins: single umbilical artery in fetus B  #Prior C/S  #Elevated BP: at least meets criteria for gestational HTN, labs pending.   Contraception: undecided Circumcision: yes x1  Casper Harrison, MD [facility] Fellow, [facility] for [facility], [facility]

## 2021-03-08 NOTE — Anesthesia Procedure Notes (Signed)
Spinal  Patient location during procedure: OB Start time: 03/08/2021 7:47 PM End time: 03/08/2021 7:50 PM Reason for block: surgical anesthesia Staffing Performed: anesthesiologist  Anesthesiologist: Trevor Iha, MD Preanesthetic Checklist Completed: patient identified, IV checked, risks and benefits discussed, surgical consent, monitors and equipment checked, pre-op evaluation and timeout performed Spinal Block Patient position: sitting Prep: DuraPrep and site prepped and draped Patient monitoring: heart rate, cardiac monitor, continuous pulse ox and blood pressure Approach: midline Location: L3-4 Injection technique: single-shot Needle Needle type: Pencan  Needle gauge: 24 G Needle length: 10 cm Needle insertion depth: 5 cm Assessment Sensory level: T4 Events: CSF return Additional Notes 1 Attempt (s). Pt tolerated procedure well.

## 2021-03-08 NOTE — Progress Notes (Signed)
[name] 37+4 wks,DI/DI twins BABY A,female,cephalic right,BPP 8/8,posterior placenta 2,SVP of fluid 3 cm,fhr 124 bpm BABY B,female,SUA,cephalic left,BPP 8/8,anterior placenta gr 2,fhr 140 bpm,SVP of fluid 4.3 cm

## 2021-03-08 NOTE — Discharge Instructions (Signed)

## 2021-03-09 ENCOUNTER — Encounter (HOSPITAL_COMMUNITY): Payer: Self-pay | Admitting: Obstetrics & Gynecology

## 2021-03-09 DIAGNOSIS — D62 Acute posthemorrhagic anemia: Secondary | ICD-10-CM

## 2021-03-09 LAB — CBC
HCT: 29.6 % — ABNORMAL LOW (ref 36.0–46.0)
Hemoglobin: 9.8 g/dL — ABNORMAL LOW (ref 12.0–15.0)
MCH: 30.9 pg (ref 26.0–34.0)
MCHC: 33.1 g/dL (ref 30.0–36.0)
MCV: 93.4 fL (ref 80.0–100.0)
Platelets: 187 10*3/uL (ref 150–400)
RBC: 3.17 MIL/uL — ABNORMAL LOW (ref 3.87–5.11)
RDW: 13.2 % (ref 11.5–15.5)
WBC: 14.9 10*3/uL — ABNORMAL HIGH (ref 4.0–10.5)
nRBC: 0 % (ref 0.0–0.2)

## 2021-03-09 LAB — RPR: RPR Ser Ql: NONREACTIVE

## 2021-03-09 MED ORDER — ASCORBIC ACID 500 MG PO TABS
250.0000 mg | ORAL_TABLET | ORAL | Status: DC
  Administered 2021-03-09: 250 mg via ORAL
  Filled 2021-03-09: qty 1

## 2021-03-09 MED ORDER — FERROUS SULFATE 325 (65 FE) MG PO TABS
325.0000 mg | ORAL_TABLET | ORAL | Status: DC
  Administered 2021-03-09: 325 mg via ORAL
  Filled 2021-03-09: qty 1

## 2021-03-09 NOTE — Lactation Note (Signed)
This note was copied from a baby's chart. Lactation Consultation Note  Patient Name: Nellene Courtois [id number] Date: 03/09/2021 Reason for consult: Follow-up assessment Age:25 years   Twins [redacted]w[redacted]d.  Mother had Baby Boy B latched upon entering at the end of feeding 30 min. Intermittent sucks and swallows observed.  More active than at earlier feeding today. Baby Boy B had been supplemented prior to breastfeeding session with 15 ml. Mother is pumping q 3 hours when able with last pumping volume 90 ml.   Baby girl was sleeping.  Mother stated she did not breastfeed with the last session but was supplemented with 15 ml. Reviewed LPI feeding plan and suggest baby boy feedings to be limited to 30 min. Reviewed volume guidelines increasing per day of life and as babies desire.   Maternal Data  HTN  Feeding Mother's Current Feeding Choice: Breast Milk  LATCH Score Latch: Grasps breast easily, tongue down, lips flanged, rhythmical sucking.  Audible Swallowing: A few with stimulation  Type of Nipple: Everted at rest and after stimulation  Comfort (Breast/Nipple): Soft / non-tender  Hold (Positioning): Assistance needed to correctly position infant at breast and maintain latch.  LATCH Score: 8   Lactation Tools Discussed/Used  DEBP   Interventions Interventions: Breast feeding basics reviewed;DEBP;Education  Discharge Pump: DEBP  Consult Status Consult Status: Follow-up Date: 03/10/21 Follow-up type: In-patient    Dahlia Byes [facility] 03/09/2021, 1:50 PM

## 2021-03-09 NOTE — Lactation Note (Signed)
This note was copied from a baby's chart. Lactation Consultation Note Baby 4 hrs. Old  Baby latched well to mom after several tries. Swallowing noted. Baby was dimpling at first then finally got a deeper latch. Mom has large everted nipples. Babies have to open wide to get a deep latch. The boy twin opens wider than the girl twin.  LC gave baby boy colostrum w/curve tip syring and gloved finger. Baby boy didn't seal mouth around finger well, some colostrum would spill from corner of mouth. Readjustment tried several times. MGM held baby while LC gave colostrum. Baby spit up some colostrum with burp.  Mom encouraged to feed baby 8-12 times/24 hours and with feeding cues.  Mom will call for assistance or questions. Mom has DEBP at home.  Patient Name: Molly Burton [id number] Date: 03/09/2021 Reason for consult: Initial assessment;Early term 37-38.6wks;Multiple gestation Age:64 hours  Maternal Data Has patient been taught Hand Expression?: Yes Does the patient have breastfeeding experience prior to this delivery?: Yes How long did the patient breastfeed?: 18 months  Feeding    LATCH Score    Audible Swallowing: A few with stimulation  Type of Nipple: Everted at rest and after stimulation  Comfort (Breast/Nipple): Soft / non-tender  Hold (Positioning): Assistance needed to correctly position infant at breast and maintain latch.      Lactation Tools Discussed/Used Tools: Pump Breast pump type: Double-Electric Breast Pump Pump Education: Setup, frequency, and cleaning;Milk Storage Pumping frequency: q3  Interventions Interventions: Breast feeding basics reviewed;Support pillows;Assisted with latch;Position options;Skin to skin;Expressed milk;Breast massage;Hand express;Breast compression;Adjust position;DEBP  Discharge Pump: DEBP  Consult Status Consult Status: Follow-up Date: 03/09/21 Follow-up type: In-patient    [name], Diamond Nickel 03/09/2021, 1:18 AM

## 2021-03-09 NOTE — Lactation Note (Signed)
This note was copied from a baby's chart. Lactation Consultation Note  Patient Name: Annalei Friesz [id number] Date: 03/09/2021 Reason for consult: Follow-up assessment;Early term 37-38.6wks;Multiple gestation Age:26 hours   Twins, [redacted]w[redacted]d.  Mother requesting assistance with latching. Baby Girl A recently breastfed. Mother hand expressed drops and then attempted to latch Baby Boy B.  Baby Boy would open wide, suck a few times and fall back asleep. Mother is going to eat breakfast then pump.  She had been able to express 10 ml. LC will return to help with pumping.  Mother has not pumped yet. Grandmother asking for a pacifier.  Education provided.  Pacifier use not recommended at this time.   Maternal Data Has patient been taught Hand Expression?: No   Lactation Tools Discussed/Used Tools: Pump Breast pump type: Double-Electric Breast Pump Reason for Pumping:  (stimulation and supplementation) Pumping frequency: q 3 hours  Interventions  Education  Discharge Pump: DEBP  Consult Status Consult Status: Follow-up Date: 03/09/21 Follow-up type: In-patient    Dahlia Byes [facility] 03/09/2021, 9:02 AM

## 2021-03-09 NOTE — Lactation Note (Signed)
This note was copied from a baby's chart. Lactation Consultation Note Baby 4 hrs old. LC assisted to the breast. Baby girl needed stimulation suckling at the breast. Took multiple tries to get baby latched and suckling well. Baby had difficulty opening wide enough at first. After BF baby took 12 ml colostrum from curve tip syring and gloved finger from LC. Baby had emesis afterwards of some thick mucous colostrum.  Mom has flowing colostrum. Collected 28 ml colostrum.  Mom encouraged to feed baby 8-12 times/24 hours and with feeding cues. Mom knows to pump q3h for 15-20 min. Set up DEBP in room. Mom didn't need to pump at this time d/t hand expressing colostrum. Milk storage reviewed. Mom encouraged to do skin-to-skin.  Mom feeling nauseated. Showed mom how to connect parts. Mom stated she remembers that. Didn't show mom how to use pump d/t not feeling well. Discussed feeding one twin at a time. Breast massage at intervals, supplementing, and pumping.  Mom BF her now 39 yr old for 18 months.  Encouraged mom to rest while she can. Call for assistance or questions. Lactation brochure given.   Patient Name: Molly Burton [id number] Date: 03/09/2021 Reason for consult: Initial assessment;Early term 37-38.6wks;Multiple gestation Age:52 hours  Maternal Data Has patient been taught Hand Expression?: Yes Does the patient have breastfeeding experience prior to this delivery?: Yes How long did the patient breastfeed?: 18 months  Feeding    LATCH Score Latch: Repeated attempts needed to sustain latch, nipple held in mouth throughout feeding, stimulation needed to elicit sucking reflex.  Audible Swallowing: None  Type of Nipple: Everted at rest and after stimulation  Comfort (Breast/Nipple): Soft / non-tender  Hold (Positioning): Assistance needed to correctly position infant at breast and maintain latch.  LATCH Score: 6   Lactation Tools Discussed/Used Tools: Pump Breast  pump type: Double-Electric Breast Pump Pump Education: Setup, frequency, and cleaning;Milk Storage Reason for Pumping: twins Pumping frequency: Q3  Interventions Interventions: Breast feeding basics reviewed;Support pillows;Assisted with latch;Position options;Skin to skin;Expressed milk;Breast massage;Hand express;DEBP;Breast compression;Adjust position  Discharge Pump: DEBP  Consult Status Consult Status: Follow-up Date: 03/09/21 Follow-up type: In-patient    Charyl Dancer 03/09/2021, 12:57 AM

## 2021-03-09 NOTE — Progress Notes (Signed)
Subjective: POD#1 rTLCS Patient is doing well without complaints. Ambulating without difficulty. Passing flatus. Tolerating PO. Abdominal pain improved. Vaginal bleeding decreased. Foley catheter in place.  Objective: Vital signs in last 24 hours: Temp:  [97.1 F (36.2 C)-98.3 F (36.8 C)] 98.3 F (36.8 C) (04/12 0741) Pulse Rate:  [66-95] 71 (04/12 0341) Resp:  [12-29] 18 (04/12 0741) BP: (108-167)/(71-101) 115/75 (04/12 0741) SpO2:  [92 %-100 %] 100 % (04/12 0741) Weight:  [90.4 kg] 90.4 kg (04/11 1456)  Physical Exam:  General: alert, cooperative and no distress Lochia: appropriate Uterine Fundus: firm Incision: dressing c/d/i DVT Evaluation: No evidence of DVT seen on physical exam.  Recent Labs    03/08/21 1730 03/09/21 0509  HGB 11.3* 9.8*  HCT 34.6* 29.6*    Assessment/Plan: POD#1 rLTCS  -doing well, meeting pp milestones  -awaiting void after urinary catheter removal  -VSS  -undecided on contraception, counseled  -desires circ for boy, consented  #preE w/o SF  -BP stable  -asymptomatic  #Acute blood loss anemia  -hgb 9.8, PO iron started  -asymptomatic  Plan for discharge POD#2 or #3.  Alric Seton 03/09/2021, 8:18 AM

## 2021-03-09 NOTE — Progress Notes (Signed)
I have reviewed and concur with students documentation.  

## 2021-03-10 NOTE — Lactation Note (Signed)
This note was copied from a baby's chart. Lactation Consultation Note  Patient Name: Molly Burton [id number] Date: 03/10/2021 Reason for consult: Follow-up assessment;Multiple gestation;Infant weight loss;Early term 37-38.6wks Age:26 hours  Visited with mom of 30 hours old ETI NICU twins baby boy and baby girl, she's a P2 and experienced BF. She reported BF her almost 26 y.o (he'll be 2 tomorrow) till 16 months. Mom and babies are going home today, reviewed sore nipples & engorgement prevention/treatment, lactogenesis II, pumping schedule, normal newborn behavior and cluster feeding.  Girl A Mom reports that baby girl does better with slow flow nipples (unlike her brother) and that she's been working on latching on the breast and doing about 20 minute feedings. Per mom baby girl is the best feeder for both, breast and bottle.  Boy B Mom reports that baby boy does better with extra slow flow nipples (the slow flow were too fast for him) and he's still working on latching on the breast. He can latch for about 10 minutes at a time and then she has to supplement afterwards.   Mom LC encouraged mom to continue pumping after feedings for a few more weeks until babies can completely empty the breast, and to supplement afterwards according to supplementation guidelines. Babies taking about 30-35 ml/feeding, mom pumping about 70-100 ml/pumping session, praised her for her efforts.   Mom's goal is to do exclusive breastmilk feedings, explained to mom the importance of consistent pumping every 2-3 hours after feedings to achieve this goal. Mom asked for more extra slow flow nipples and breastmilk bottles, LC provided with these items. GOB present in the room at the time of [facility] consultation. Family reported all questions and concerns were answered, they're both aware of LC OP services and will contact PRN.   Maternal Data    Feeding Mother's Current Feeding Choice: Breast Milk  LATCH Score                     Lactation Tools Discussed/Used Tools: Pump;Flanges;Coconut oil Flange Size: 24 Breast pump type: Double-Electric Breast Pump Pump Education: Setup, frequency, and cleaning;Milk Storage Reason for Pumping: ETI twins Pumping frequency: q 3 hours Pumped volume: 100 mL  Interventions Interventions: Breast feeding basics reviewed;Coconut oil;DEBP  Discharge Discharge Education: Engorgement and breast care;Warning signs for feeding baby;Outpatient recommendation Pump: DEBP  Consult Status Consult Status: Complete Date: 03/10/21 Follow-up type: Call as needed    Molly Burton 03/10/2021, 4:39 PM

## 2021-03-11 ENCOUNTER — Telehealth: Payer: Self-pay | Admitting: Obstetrics & Gynecology

## 2021-03-11 ENCOUNTER — Other Ambulatory Visit: Payer: Self-pay | Admitting: Advanced Practice Midwife

## 2021-03-11 LAB — SURGICAL PATHOLOGY

## 2021-03-11 MED ORDER — OXYCODONE HCL 5 MG PO TABS: 5.0000 mg | ORAL_TABLET | Freq: Four times a day (QID) | ORAL | 0 refills | Status: DC | PRN

## 2021-03-11 MED ORDER — IBUPROFEN 600 MG PO TABS: 600.0000 mg | ORAL_TABLET | Freq: Four times a day (QID) | ORAL | 0 refills | Status: DC | PRN

## 2021-03-11 NOTE — Telephone Encounter (Signed)
Prescriptions sent

## 2021-03-11 NOTE — Telephone Encounter (Signed)
Patient stated that the hospital sent her home without any pain medication. Patient stated that she would like some medication for her pain. Clinical staff will follow up with patient.

## 2021-03-11 NOTE — Progress Notes (Signed)
Oxycodone and ibuprofen for cs (not sent home with any pain meds)

## 2021-03-12 ENCOUNTER — Other Ambulatory Visit (HOSPITAL_COMMUNITY): Payer: No Typology Code available for payment source

## 2021-03-12 ENCOUNTER — Encounter (HOSPITAL_COMMUNITY)
Admission: RE | Admit: 2021-03-12 | Discharge: 2021-03-12 | Disposition: A | Discharge status: Home / Self Care | Payer: No Typology Code available for payment source | Source: Ambulatory Visit | Attending: Obstetrics & Gynecology | Admitting: Obstetrics & Gynecology

## 2021-03-15 ENCOUNTER — Inpatient Hospital Stay (HOSPITAL_COMMUNITY): Admit: 2021-03-15 | Payer: No Typology Code available for payment source | Admitting: Obstetrics & Gynecology

## 2021-03-15 ENCOUNTER — Encounter (HOSPITAL_COMMUNITY): Payer: Self-pay

## 2021-03-15 SURGERY — Surgical Case
Anesthesia: Regional

## 2021-03-18 ENCOUNTER — Ambulatory Visit (INDEPENDENT_AMBULATORY_CARE_PROVIDER_SITE_OTHER): Payer: No Typology Code available for payment source | Admitting: Obstetrics & Gynecology

## 2021-03-18 ENCOUNTER — Other Ambulatory Visit: Payer: Self-pay

## 2021-03-18 ENCOUNTER — Encounter: Payer: Self-pay | Admitting: Obstetrics & Gynecology

## 2021-03-18 DIAGNOSIS — O1493 Unspecified pre-eclampsia, third trimester: Secondary | ICD-10-CM

## 2021-03-18 MED ORDER — NIFEDIPINE ER OSMOTIC RELEASE 30 MG PO TB24
30.0000 mg | ORAL_TABLET | Freq: Every day | ORAL | 0 refills | Status: DC
Start: 2021-03-18 — End: 2021-03-23

## 2021-03-18 NOTE — Patient Instructions (Addendum)
IF blood pressure is greater than 160 (top) or 110 (bottom)- please call and/or go immediately [facility].  Should you note- headache, swelling, overall worsening of postpartum care- go to hospital.  Please check your blood pressure about 2 hrs after taking the medication.

## 2021-03-18 NOTE — Progress Notes (Signed)
    Post Partum Visit Note  Molly Burton is a 25 y.o. G61P2003 female who presents for a postoperative visit. She is ~2 week postop following a repeat C-section for twins completed on 03/08/21.    Pregnancy complicated by: -Preeclampsia no severe features- no Mag, no medication She has not been taking BP at home.    She does not intermittent HA, which she thought was due to fatigue.   Denies headache currently, no blurry vision. Other than the fatigue, she reported no acute complaints.  Minimal bleeding. Denies fever or chills.  Tolerating gen diet.  +Flatus, Regular BMs. Her only concern is the protrusion of her stomach.   Objective:  BP (!) 156/97 (BP Location: Right Arm, Patient Position: Sitting, Cuff Size: Normal)   Pulse 62   Ht 5\' 3"  (1.6 m)   Wt 155 lb (70.3 kg)   Breastfeeding Yes   BMI 27.46 kg/m   Repeat BP 150/100   Physical Examination:  GENERAL ASSESSMENT: well developed and well nourished SKIN: normal color, no lesions CHEST: normal air exchange, respiratory effort normal with no retractions HEART: regular rate and rhythm ABDOMEN: soft, non-distended INCISION: C/D/I- healing appropriately EXTREMITY: no edema, no calf tenderness PSYCH: mood appropriate, normal affect       Assessment:    s/p repeat C-section 03/08/21 complicated preeclampsia  Plan:   1) Preeclampsia -discussed concern for potential worsening of preeclampsia -plan to start on ProcardiaXL 30 mg daily -advised to check BP daily and given precautions- that should she note elevation or symptoms- pt to go directly to [facility] -  -plan for repeat BP check early next week  2) Rectus Diastasis -reassured pt that unfortunately this happens frequently -encouraged pt to give body more time to heal- may consider referral to PT in the future  -from a C-section standpoint- meeting milestones appropriately -plan to review contraception at next visit  [facility], DO Attending Obstetrician &  Gynecologist, [facility] for Molly Burton, Molly Burton

## 2021-03-23 ENCOUNTER — Encounter: Payer: Self-pay | Admitting: Women's Health

## 2021-03-23 ENCOUNTER — Encounter: Payer: Self-pay | Admitting: *Deleted

## 2021-03-23 ENCOUNTER — Ambulatory Visit (INDEPENDENT_AMBULATORY_CARE_PROVIDER_SITE_OTHER): Payer: No Typology Code available for payment source | Admitting: *Deleted

## 2021-03-23 ENCOUNTER — Other Ambulatory Visit: Payer: Self-pay

## 2021-03-23 VITALS — BP 137/93 | HR 71 | Ht 63.0 in | Wt 151.5 lb

## 2021-03-23 DIAGNOSIS — O165 Unspecified maternal hypertension, complicating the puerperium: Secondary | ICD-10-CM | POA: Insufficient documentation

## 2021-03-23 DIAGNOSIS — Z0130 Encounter for examination of blood pressure without abnormal findings: Secondary | ICD-10-CM

## 2021-03-23 MED ORDER — NIFEDIPINE ER OSMOTIC RELEASE 60 MG PO TB24: 60.0000 mg | ORAL_TABLET | Freq: Every day | ORAL | 6 refills | Status: DC

## 2021-03-23 NOTE — Progress Notes (Addendum)
   NURSE VISIT- BLOOD PRESSURE CHECK  SUBJECTIVE:  Molly Burton is a 25 y.o. G36P3003 female here for BP check. She is postpartum, delivery date 03/08/21    HYPERTENSION ROS:  Pregnant/postpartum:  . Severe headaches that don't go away with tylenol/other medicines: No  . Visual changes (seeing spots/double/blurred vision) No  . Severe pain under right breast breast or in center of upper chest No  . Severe nausea/vomiting No  . Taking medicines as instructed yes  .   OBJECTIVE: 1st BP 155/104 BP (!) 137/93 (BP Location: Right Arm, Patient Position: Sitting, Cuff Size: Normal)   Pulse 71   Ht 5\' 3"  (1.6 m)   Wt 151 lb 8 oz (68.7 kg)   Breastfeeding Yes   BMI 26.84 kg/m  1st BP reading was 155/104. Appearance alert, well appearing, and in no distress.  ASSESSMENT: Postpartum  blood pressure check  PLAN: Discussed with , CNM, [facility]   Recommendations: Procardia will be increased to 60 mg daily.   Follow-up: Friday for BP check with nurse. Take med 1 hour before visit on Friday.    Wednesday  03/23/2021 12:13 PM   Chart reviewed for nurse visit. Agree with plan of care. Increase procardia to 60mg  daily. New rx sent.  03/25/2021, 03/23/2021 1:26 PM

## 2021-03-23 NOTE — Addendum Note (Signed)
Addended by: Cheral Marker on: 03/23/2021 01:28 PM   Modules accepted: Orders

## 2021-03-29 ENCOUNTER — Ambulatory Visit (INDEPENDENT_AMBULATORY_CARE_PROVIDER_SITE_OTHER): Payer: No Typology Code available for payment source

## 2021-03-29 ENCOUNTER — Other Ambulatory Visit: Payer: Self-pay

## 2021-03-29 VITALS — BP 133/92 | HR 77

## 2021-03-29 DIAGNOSIS — Z8759 Personal history of other complications of pregnancy, childbirth and the puerperium: Secondary | ICD-10-CM

## 2021-03-29 NOTE — Progress Notes (Addendum)
   NURSE VISIT- BLOOD PRESSURE CHECK  SUBJECTIVE:  Molly Burton is a 25 y.o. G50P2003 female here for BP check. She is postpartum, delivery date 03/08/21    HYPERTENSION ROS:  Pregnant/postpartum:  . Severe headaches that don't go away with tylenol/other medicines: No  . Visual changes (seeing spots/double/blurred vision) No  . Severe pain under right breast breast or in center of upper chest No  . Severe nausea/vomiting No  . Taking medicines as instructed - Pt accidentally took 30 mg instead of 60 mg for the past 3 days.  OBJECTIVE: 1st BP 143/92 BP (!) 133/92 (BP Location: Right Arm, Patient Position: Sitting, Cuff Size: Normal)   Pulse 77   Breastfeeding Yes   Appearance alert, well appearing, and in no distress, oriented to person, place, and time and normal appearing weight.  ASSESSMENT: Postpartum  blood pressure check  PLAN: Discussed with [name], CNM, [facility]   Recommendations: Take 60 mg as prescribed   Follow-up: in one week   Anaaya Fuster A [place]  03/29/2021 4:11 PM   Chart reviewed for nurse visit. Agree with plan of care. Notices decrease in milk production- will send tips.  Cheral Marker, [place] 03/29/2021 4:15 PM

## 2021-04-19 ENCOUNTER — Ambulatory Visit: Payer: No Typology Code available for payment source | Admitting: Women's Health

## 2021-04-22 ENCOUNTER — Ambulatory Visit (INDEPENDENT_AMBULATORY_CARE_PROVIDER_SITE_OTHER): Payer: No Typology Code available for payment source | Admitting: Women's Health

## 2021-04-22 ENCOUNTER — Other Ambulatory Visit: Payer: Self-pay

## 2021-04-22 ENCOUNTER — Encounter: Payer: Self-pay | Admitting: Women's Health

## 2021-04-22 DIAGNOSIS — E0590 Thyrotoxicosis, unspecified without thyrotoxic crisis or storm: Secondary | ICD-10-CM | POA: Diagnosis not present

## 2021-04-22 DIAGNOSIS — Z98891 History of uterine scar from previous surgery: Secondary | ICD-10-CM

## 2021-04-22 NOTE — Progress Notes (Signed)
POSTPARTUM VISIT Patient name: Molly Burton MRN [id number]  Date of birth: [birth year] Chief Complaint:   Postpartum Care  History of Present Illness:   Molly Burton is a 25 y.o. G17P2003 African American female being seen today for a postpartum visit. She is 6 weeks postpartum following a repeat cesarean section, low transverse incision at 37.4 gestational weeks d/t pre-e w/ twins. IOL: no, for n/a. Anesthesia: spinal.  Laceration: n/a.  Complications: none. Inpatient contraception: no.   Pregnancy complicated by hyperthyroidism, twin gestation, pre-e. Tobacco use: no. Substance use disorder: no. Last pap smear: 08/29/18 and results were NILM w/ HRHPV not done. Next pap smear due: Oct No LMP recorded. (Menstrual status: Lactating).  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 Still taking, took this am. Has still been getting some elevated bp readings at home, thinks her cuff may be wrong. Bleeding none. Bowel function is normal. Bladder function is normal. Urinary incontinence? no, fecal incontinence? no Patient is not sexually active. Last sexual activity: prior to birth of baby. Desired contraception: Unsure. Patient does want a pregnancy in the future.  Desired family size is unsure #of children. Wants to wait 5-34yrs for next baby.   Upstream - 04/22/21 0945      Pregnancy Intention Screening   Does the patient want to become pregnant in the next year? No    Does the patient's partner want to become pregnant in the next year? No    Would the patient like to discuss contraceptive options today? No      Contraception Wrap Up   Current Method --   breast feeding   End Method --   breast feeding   Contraception Counseling Provided No          The pregnancy intention screening data noted above was reviewed. Potential methods of contraception were discussed. The patient elected to proceed with Abstinence. Will let us know  if she decides for contraception from [name].   Edinburgh Postpartum Depression Screening: negative  Edinburgh Postnatal Depression Scale - 04/22/21 0941      Edinburgh Postnatal Depression Scale:  In the Past 7 Days   I have been able to laugh and see the funny side of things. 0    I have looked forward with enjoyment to things. 0    I have blamed myself unnecessarily when things went wrong. 0    I have been anxious or worried for no good reason. 0    I have felt scared or panicky for no good reason. 0    Things have been getting on top of me. 0    I have been so unhappy that I have had difficulty sleeping. 0    I have felt sad or miserable. 0    I have been so unhappy that I have been crying. 0    The thought of harming myself has occurred to me. 0    Edinburgh Postnatal Depression Scale Total 0          Baby's course has been uncomplicated. Baby is feeding by breast: milk supply adequate. Infant has a pediatrician/family doctor? Yes.  Childcare strategy if returning to work/school: n/a-working from home.  Pt has material needs met for her and baby: Yes.   Review of Systems:   Pertinent items are noted in HPI Denies Abnormal vaginal discharge w/ itching/odor/irritation, headaches, visual changes, shortness of breath, chest pain, abdominal pain, severe nausea/vomiting, or  problems with urination or bowel movements. Pertinent History Reviewed:  Reviewed past medical,surgical, obstetrical and family history.  Reviewed problem list, medications and allergies. OB History  Gravida Para Term Preterm AB Living  $Remov'2 2 2     3  'zybbiM$ SAB IAB Ectopic Multiple Live Births        1 3    # Outcome Date GA Lbr Len/2nd Weight Sex Delivery Anes PTL Lv  2A Term 03/08/21 [redacted]w[redacted]d  6 lb 12.1 oz (3.065 kg) F CS-LTranv Spinal  LIV  2B Term 03/08/21 [redacted]w[redacted]d  6 lb 1.4 oz (2.761 kg) M CS-LTranv Spinal  LIV  1 Term 03/12/19 [redacted]w[redacted]d  8 lb 7.5 oz (3.841 kg) M CS-LTranv EPI  LIV     Complications: Fetal Intolerance    Physical Assessment:   Vitals:   04/22/21 0933  BP: 126/79  Pulse: 80  Weight: 147 lb (66.7 kg)  Height: $Remove'5\' 2"'eoarYaP$  (1.575 m)  Body mass index is 26.89 kg/m.       Physical Examination:   General appearance: alert, well appearing, and in no distress  Mental status: alert, oriented to person, place, and time  Skin: warm & dry   Cardiovascular: normal heart rate noted   Respiratory: normal respiratory effort, no distress   Breasts: deferred, no complaints   Abdomen: soft, non-tender, c/s incision well-healed  Pelvic: examination not indicated. Thin prep pap obtained: No (offered, due in Oct, wants to wait until then)  Rectal: not examined  Extremities: Edema: none   Chaperone: N/A         No results found for this or any previous visit (from the past 24 hour(s)).  Assessment & Plan:  1) Postpartum exam 2) 6 wks s/p repeat cesarean section, low transverse incision of twins, pre-e 3) breast feeding 4) Depression screening 5) Contraception counseling>declines contraception right now, plans to remain abstinent for awhile 6) PPHTN> on procardia $RemoveBefo'60mg'NaUJEopyZJK$ . Don't take Sun & Mon, come in Tues for bp check w/ nurse, bring home cuff to correlate (has been getting high readings at home but thinks cuff is wrong). If on Sun/Mon notices headache/feels bad/bp high, can restart procardia 7) Hyperthyroidism> no meds, will recheck TSH, T3, free T4 on Tues   Essential components of care per ACOG recommendations:  1.  Mood and well being:  . If positive depression screen, discussed and plan developed.  . If using tobacco we discussed reduction/cessation and risk of relapse . If current substance abuse, we discussed and referral to local resources was offered.   2. Infant care and feeding:  . If breastfeeding, discussed returning to work, pumping, breastfeeding-associated pain, guidance regarding return to fertility while lactating if not using another method. If needed, patient was provided with a  letter to be allowed to pump q 2-3hrs to support lactation in a private location with access to a refrigerator to store breastmilk.   . Recommended that all caregivers be immunized for flu, pertussis and other preventable communicable diseases . If pt does not have material needs met for her/baby, referred to local resources for help obtaining these.  3. Sexuality, contraception and birth spacing . Provided guidance regarding sexuality, management of dyspareunia, and resumption of intercourse . Discussed avoiding interpregnancy interval <88mths and recommended birth spacing of 18 months  4. Sleep and fatigue . Discussed coping options for fatigue and sleep disruption . Encouraged family/partner/community support of 4 hrs of uninterrupted sleep to help with mood and fatigue  5. Physical recovery  . If pt had  a C/S, assessed incisional pain and providing guidance on normal vs prolonged recovery . If pt had a laceration, perineal healing and pain reviewed.  . If urinary or fecal incontinence, discussed management and referred to PT or uro/gyn if indicated  . Patient is safe to resume physical activity. Discussed attainment of healthy weight.  6.  Chronic disease management . Discussed pregnancy complications if any, and their implications for future childbearing and long-term maternal health. . Review recommendations for prevention of recurrent pregnancy complications, such as 17 hydroxyprogesterone caproate to reduce risk for recurrent PTB not applicable, or aspirin to reduce risk of preeclampsia yes. . Pt had GDM: no. If yes, 2hr GTT scheduled: not applicable. . Reviewed medications and non-pregnant dosing including consideration of whether pt is breastfeeding using a reliable resource such as LactMed: yes . Referred for f/u w/ PCP or subspecialist providers as indicated: not applicable  7. Health maintenance . Mammogram at 26yo or earlier if indicated . Pap smears as indicated  Meds: No  orders of the defined types were placed in this encounter.   Follow-up: Return for Tues , nurse bp check & labs.   Orders Placed This Encounter  Procedures  . TSH  . T3  . T4, free    [place], [facility] 04/22/2021 10:03 AM

## 2021-04-22 NOTE — Patient Instructions (Signed)
Do not take procardia on Sun or Mon, bring your cuff with you on Tues

## 2021-06-21 ENCOUNTER — Encounter: Payer: Self-pay | Admitting: Nurse Practitioner

## 2021-06-21 ENCOUNTER — Other Ambulatory Visit: Payer: Self-pay

## 2021-06-21 ENCOUNTER — Ambulatory Visit (INDEPENDENT_AMBULATORY_CARE_PROVIDER_SITE_OTHER): Payer: No Typology Code available for payment source | Admitting: Nurse Practitioner

## 2021-06-21 VITALS — BP 160/95 | HR 63 | Temp 98.4°F | Ht 62.0 in | Wt 152.0 lb

## 2021-06-21 DIAGNOSIS — Z7689 Persons encountering health services in other specified circumstances: Secondary | ICD-10-CM

## 2021-06-21 DIAGNOSIS — Z30430 Encounter for insertion of intrauterine contraceptive device: Secondary | ICD-10-CM | POA: Insufficient documentation

## 2021-06-21 DIAGNOSIS — Z0001 Encounter for general adult medical examination with abnormal findings: Secondary | ICD-10-CM | POA: Insufficient documentation

## 2021-06-21 DIAGNOSIS — E0590 Thyrotoxicosis, unspecified without thyrotoxic crisis or storm: Secondary | ICD-10-CM | POA: Diagnosis not present

## 2021-06-21 DIAGNOSIS — O165 Unspecified maternal hypertension, complicating the puerperium: Secondary | ICD-10-CM

## 2021-06-21 DIAGNOSIS — L84 Corns and callosities: Secondary | ICD-10-CM | POA: Diagnosis not present

## 2021-06-21 MED ORDER — NIFEDIPINE ER OSMOTIC RELEASE 60 MG PO TB24: 60.0000 mg | ORAL_TABLET | Freq: Every day | ORAL | 3 refills | Status: DC

## 2021-06-21 NOTE — Progress Notes (Signed)
New Patient Office Visit  Subjective:  Patient ID: Molly Burton, female    DOB: [birth year]  Age: 25 y.o. MRN: [id number]  CC:  Chief Complaint  Patient presents with   New Patient (Initial Visit)    Here to establish care. Would like referral to Podiatry for dry feet, ongoing for awhile now.    HPI Molly Burton presents for new patient visit. No recent PCP. Last physical was about a year ago. No recent labs with PCP.  She is followed by [facility] for GYN needs.  She has had some postpartum HTN. She stopped taking nifedipine because she was concerned with it decreasing her milk supply. She delivered twins in April.  Pt states she has dry feet and would like podiatry referral. She has tried scraping her feet while at home, but it is not helping.  She was taking glycopyrrolate for sweaty palms and she hasn't taken that in over 2 years.   Past Medical History:  Diagnosis Date   Hypertension     Past Surgical History:  Procedure Laterality Date   CESAREAN SECTION N/A 03/12/2019   Procedure: CESAREAN SECTION;  Surgeon: Truett Mainland, DO;  Location: [place] LD ORS;  Service: Obstetrics;  Laterality: N/A;   CESAREAN SECTION MULTI-GESTATIONAL N/A 03/08/2021   Procedure: CESAREAN SECTION MULTI-GESTATIONAL;  Surgeon: Florian Buff, MD;  Location: [facility] LD ORS;  Service: Obstetrics;  Laterality: N/A;   TONSILLECTOMY      Family History  Problem Relation Age of Onset   Thyroid disease Maternal Grandmother    Thyroid disease Paternal Grandmother     Social History   Socioeconomic History   Marital status: Single    Spouse name: Not on file   Number of children: 3   Years of education: N/A   Highest education level: Bachelor's degree (e.g., BA, AB, BS)  Occupational History   Occupation: [employer]    Comment: Insurance- Group coverage- underwriting  Tobacco Use   Smoking status: Never   Smokeless tobacco: Never  Vaping Use   Vaping Use: Never used   Substance and Sexual Activity   Alcohol use: Not Currently    Comment: Pt did have wine on 08/05/2018   Drug use: Not Currently    Types: Marijuana    Comment: not since 07/03/18   Sexual activity: Not Currently    Birth control/protection: None  Other Topics Concern   Not on file  Social History Narrative   Not on file   Social Determinants of Health   Financial Resource Strain: Low Risk    Difficulty of Paying Living Expenses: Not hard at all  Food Insecurity: No Food Insecurity   Worried About Charity fundraiser in the Last Year: Never true   [place] in the Last Year: Never true  Transportation Needs: No Transportation Needs   Lack of Transportation (Medical): No   Lack of Transportation (Non-Medical): No  Physical Activity: Inactive   Days of Exercise per Week: 0 days   Minutes of Exercise per Session: 0 min  Stress: No Stress Concern Present   Feeling of Stress : Not at all  Social Connections: Moderately Isolated   Frequency of Communication with Friends and Family: More than three times a week   Frequency of Social Gatherings with Friends and Family: Once a week   Attends Religious Services: 1 to 4 times per year   Active Member of [employer] or Organizations: No   Attends [employer]  or Organization Meetings: Never   Marital Status: Never married  Intimate Partner Violence: Not At Risk   Fear of Current or Ex-Partner: No   Emotionally Abused: No   Physically Abused: No   Sexually Abused: No    ROS Review of Systems  Constitutional: Negative.   Respiratory: Negative.    Cardiovascular: Negative.   Skin:        Calluses to feet  Psychiatric/Behavioral: Negative.     Objective:   Today's Vitals: BP (!) 160/95 (BP Location: Left Arm, Patient Position: Sitting, Cuff Size: Normal)   Pulse 63   Temp 98.4 F (36.9 C) (Temporal)   Ht 5' 2" (1.575 m)   Wt 152 lb (68.9 kg)   SpO2 96%   Breastfeeding Yes   BMI 27.80 kg/m   Physical Exam Constitutional:       Appearance: Normal appearance.  Cardiovascular:     Rate and Rhythm: Normal rate and regular rhythm.     Pulses: Normal pulses.     Heart sounds: Normal heart sounds.  Pulmonary:     Effort: Pulmonary effort is normal.     Breath sounds: Normal breath sounds.  Neurological:     Mental Status: She is alert.  Psychiatric:        Mood and Affect: Mood normal.        Behavior: Behavior normal.        Thought Content: Thought content normal.        Judgment: Judgment normal.    Assessment & Plan:   Problem List Items Addressed This Visit       Cardiovascular and Mediastinum   Postpartum hypertension    -she stopped her nifedipine 60 mg bc her BP was improving BP Readings from Last 3 Encounters:  06/21/21 (!) 160/95  04/22/21 126/79  03/29/21 (!) 133/92   -she can check her BP at home -goal BP < 140/90 -Rx. Nifedipine d/t currently lactating      Relevant Medications   NIFEdipine (PROCARDIA XL) 60 MG 24 hr tablet     Endocrine   Hyperthyroidism    -check TSH with next set of labs         Musculoskeletal and Integument   Callus of heel    -to bilateral feet -pt requesting podiatry referral; will make the referral       Relevant Orders   Ambulatory referral to Podiatry     Other   Encounter to establish care - Primary    -no recent PCP; has been seeing [facility] for GYN needs       Relevant Orders   CBC with Differential/Platelet   CMP14+EGFR   Lipid Panel With LDL/HDL Ratio   TSH    Outpatient Encounter Medications as of 06/21/2021  Medication Sig   NIFEdipine (PROCARDIA XL) 60 MG 24 hr tablet Take 1 tablet (60 mg total) by mouth daily.   Prenat w/o A-FeCbGl-[id number]-DHA (CITRANATAL ASSURE) 35-1 & 300 MG tablet One tablet and one capsule daily (Patient taking differently: Take 1 tablet by mouth daily.)   [DISCONTINUED] NIFEdipine (PROCARDIA XL) 60 MG 24 hr tablet Take 1 tablet (60 mg total) by mouth daily. (Patient not taking: Reported on 06/21/2021)    No facility-administered encounter medications on file as of 06/21/2021.    Follow-up: Return in about 1 month (around 07/22/2021) for Physical Exam (nifedipine).   JOSEPH M GRAY, NP  

## 2021-06-21 NOTE — Patient Instructions (Signed)
Please have fasting labs drawn 2-3 days prior to your appointment so we can discuss the results during your office visit.  

## 2021-06-21 NOTE — Assessment & Plan Note (Signed)
-  she stopped her nifedipine 60 mg bc her BP was improving BP Readings from Last 3 Encounters:  06/21/21 (!) 160/95  04/22/21 126/79  03/29/21 (!) 133/92   -she can check her BP at home -goal BP < 140/90 -Rx. Nifedipine d/t currently lactating

## 2021-06-21 NOTE — Assessment & Plan Note (Signed)
-  no recent PCP; has been seeing [facility] for GYN needs

## 2021-06-21 NOTE — Assessment & Plan Note (Addendum)
-  to bilateral feet -pt requesting podiatry referral; will make the referral

## 2021-06-21 NOTE — Assessment & Plan Note (Signed)
-  check TSH with next set of labs

## 2021-06-27 ENCOUNTER — Encounter (HOSPITAL_COMMUNITY): Payer: Self-pay

## 2021-06-27 ENCOUNTER — Ambulatory Visit (HOSPITAL_COMMUNITY)
Admission: EM | Admit: 2021-06-27 | Discharge: 2021-06-27 | Disposition: A | Discharge status: Home / Self Care | Payer: No Typology Code available for payment source | Attending: Family Medicine | Admitting: Family Medicine

## 2021-06-27 ENCOUNTER — Other Ambulatory Visit: Payer: Self-pay

## 2021-06-27 DIAGNOSIS — H00012 Hordeolum externum right lower eyelid: Secondary | ICD-10-CM

## 2021-06-27 MED ORDER — ERYTHROMYCIN 5 MG/GM OP OINT: TOPICAL_OINTMENT | OPHTHALMIC | 0 refills | Status: DC

## 2021-06-27 NOTE — ED Triage Notes (Signed)
Pt present right eye irration with some swelling on the bottom eyelid. She noticed it three days ago with some itching.

## 2021-06-27 NOTE — ED Provider Notes (Signed)
[facility]    CSN: [id number] Arrival date & time: 06/27/21  1115      History   Chief Complaint Chief Complaint  Patient presents with   Eye Problem    Right eye    HPI Molly Burton is a 25 y.o. female.   3-day history of right lower eyelid red, swollen region that is minimally tender.  Has not been trying thing over-the-counter for symptoms.  Denies drainage, fevers, conjunctival redness or itching or drainage, injury to the area, new products used.  No visual changes.   Past Medical History:  Diagnosis Date   Hypertension     Patient Active Problem List   Diagnosis Date Noted   Encounter to establish care 06/21/2021   Callus of heel 06/21/2021   Postpartum hypertension 03/23/2021   Acute blood loss anemia 03/09/2021   History of cesarean delivery 09/16/2020   H/O pre-eclampsia 09/16/2020   Arthralgia    Hyperthyroidism 02/08/2018    Past Surgical History:  Procedure Laterality Date   CESAREAN SECTION N/A 03/12/2019   Procedure: CESAREAN SECTION;  Surgeon: Levie Heritage, DO;  Location: [facility];  Service: Obstetrics;  Laterality: N/A;   CESAREAN SECTION MULTI-GESTATIONAL N/A 03/08/2021   Procedure: CESAREAN SECTION MULTI-GESTATIONAL;  Surgeon: Lazaro Arms, MD;  Location: [facility];  Service: Obstetrics;  Laterality: N/A;   TONSILLECTOMY      OB History     Gravida  2   Para  2   Term  2   Preterm      AB      Living  3      SAB      IAB      Ectopic      Multiple  1   Live Births  3            Home Medications    Prior to Admission medications   Medication Sig Start Date End Date Taking? Authorizing Provider  erythromycin ophthalmic ointment Place a 1/2 inch ribbon of ointment into the right lower eyelid BID prn. 06/27/21  Yes Particia Nearing, PA-C  NIFEdipine (PROCARDIA XL) 60 MG 24 hr tablet Take 1 tablet (60 mg total) by mouth daily. 06/21/21   Heather Roberts, NP  Prenat w/o [id number]  (CITRANATAL ASSURE) 35-1 & 300 MG tablet One tablet and one capsule daily Patient taking differently: Take 1 tablet by mouth daily. 01/29/21   Cheral Marker, CNM    Family History Family History  Problem Relation Age of Onset   Thyroid disease Maternal Grandmother    Thyroid disease Paternal Grandmother     Social History Social History   Tobacco Use   Smoking status: Never   Smokeless tobacco: Never  Vaping Use   Vaping Use: Never used  Substance Use Topics   Alcohol use: Not Currently    Comment: Pt did have wine on 08/05/2018   Drug use: Not Currently    Types: Marijuana    Comment: not since 07/03/18     Allergies   Patient has no known allergies.   Review of Systems Review of Systems Per HPI  Physical Exam Triage Vital Signs ED Triage Vitals  Enc Vitals Group     BP 06/27/21 1225 133/86     Pulse Rate 06/27/21 1225 99     Resp 06/27/21 1225 16     Temp 06/27/21 1225 98.7 F (37.1 C)     Temp Source 06/27/21 1225 Oral  SpO2 06/27/21 1225 100 %     Weight --      Height --      Head Circumference --      Peak Flow --      Pain Score 06/27/21 1224 0     Pain Loc --      Pain Edu? --      Excl. in GC? --    No data found.  Updated Vital Signs BP 133/86 (BP Location: Left Arm)   Pulse 99   Temp 98.7 F (37.1 C) (Oral)   Resp 16   SpO2 100%   Breastfeeding Yes   Visual Acuity Right Eye Distance:   Left Eye Distance:   Bilateral Distance:    Right Eye Near:   Left Eye Near:    Bilateral Near:     Physical Exam Vitals and nursing note reviewed.  Constitutional:      Appearance: Normal appearance. She is not ill-appearing.  HENT:     Head: Atraumatic.     Mouth/Throat:     Mouth: Mucous membranes are moist.  Eyes:     Extraocular Movements: Extraocular movements intact.     Pupils: Pupils are equal, round, and reactive to light.     Comments: Small erythematous stye right lower medial lash line.  No active drainage.  Minimally  tender to palpation  Cardiovascular:     Rate and Rhythm: Normal rate and regular rhythm.     Heart sounds: Normal heart sounds.  Pulmonary:     Effort: Pulmonary effort is normal.     Breath sounds: Normal breath sounds.  Musculoskeletal:        General: Normal range of motion.     Cervical back: Normal range of motion and neck supple.  Skin:    General: Skin is warm and dry.  Neurological:     Mental Status: She is alert and oriented to person, place, and time.  Psychiatric:        Mood and Affect: Mood normal.        Thought Content: Thought content normal.        Judgment: Judgment normal.     UC Treatments / Results  Labs (all labs ordered are listed, but only abnormal results are displayed) Labs Reviewed - No data to display  EKG   Radiology No results found.  Procedures Procedures (including critical care time)  Medications Ordered in UC Medications - No data to display  Initial Impression / Assessment and Plan / UC Course  I have reviewed the triage vital signs and the nursing notes.  Pertinent labs & imaging results that were available during my care of the patient were reviewed by me and considered in my medical decision making (see chart for details).     Treat with warm compresses, erythromycin ointment.  Follow-up with eye specialist if not fully resolving or worsening. Final Clinical Impressions(s) / UC Diagnoses   Final diagnoses:  Hordeolum externum of right lower eyelid   Discharge Instructions   None    ED Prescriptions     Medication Sig Dispense Auth. Provider   erythromycin ophthalmic ointment Place a 1/2 inch ribbon of ointment into the right lower eyelid BID prn. 3.5 g Particia Nearing, PA-C      PDMP not reviewed this encounter.   Particia Nearing, [place] 06/27/21 1319

## 2021-06-28 ENCOUNTER — Ambulatory Visit: Payer: No Typology Code available for payment source | Admitting: Podiatry

## 2021-07-01 ENCOUNTER — Telehealth: Payer: Self-pay

## 2021-07-01 NOTE — Telephone Encounter (Signed)
Patient calling she states she been on a new bp medication for a little over a week and her bp is still elevated 135-90 last time she checked she wants to know if the medication should be dosed higher or if she should be seen ph# [phone]

## 2021-07-01 NOTE — Telephone Encounter (Signed)
BP was 160s/90s when she was in the clinic, so the medicine is working. We may increase the dose at the next appointment, but sometimes it takes a few weeks to get the maximum effect from the medication. I don't want to bump up the dose and then drop your BP too much. It's better to go slow than to move too fast.

## 2021-07-02 NOTE — Telephone Encounter (Signed)
Pt informed

## 2021-07-08 ENCOUNTER — Other Ambulatory Visit: Payer: Self-pay

## 2021-07-08 ENCOUNTER — Encounter: Payer: Self-pay | Admitting: Podiatry

## 2021-07-08 ENCOUNTER — Ambulatory Visit (INDEPENDENT_AMBULATORY_CARE_PROVIDER_SITE_OTHER): Payer: No Typology Code available for payment source | Admitting: Podiatry

## 2021-07-08 DIAGNOSIS — L859 Epidermal thickening, unspecified: Secondary | ICD-10-CM | POA: Diagnosis not present

## 2021-07-09 NOTE — Progress Notes (Signed)
  Subjective:  Patient ID: Molly Burton, female    DOB: [birth year],  MRN: [id number] HPI Chief Complaint  Patient presents with   Foot Pain    Sub 5th MPJ and medial heels bilateral - callused areas x years, tried trimming but comes back in 3-4 days   New Patient (Initial Visit)    25 y.o. female presents with the above complaint.   ROS: Denies fever chills nausea vomiting muscle aches pains calf pain back pain chest pain shortness of breath.  Past Medical History:  Diagnosis Date   Hypertension    Past Surgical History:  Procedure Laterality Date   CESAREAN SECTION N/A 03/12/2019   Procedure: CESAREAN SECTION;  Surgeon: Levie Heritage, DO;  Location: [facility];  Service: Obstetrics;  Laterality: N/A;   CESAREAN SECTION MULTI-GESTATIONAL N/A 03/08/2021   Procedure: CESAREAN SECTION MULTI-GESTATIONAL;  Surgeon: Lazaro Arms, MD;  Location: [facility];  Service: Obstetrics;  Laterality: N/A;   TONSILLECTOMY      Current Outpatient Medications:    NIFEdipine (ADALAT CC) 60 MG 24 hr tablet, Take 60 mg by mouth daily., Disp: , Rfl:   No Known Allergies Review of Systems Objective:  There were no vitals filed for this visit.  General: Well developed, nourished, in no acute distress, alert and oriented x3   Dermatological: Skin is warm, dry and supple bilateral. Nails x 10 are well maintained; remaining integument appears unremarkable at this time. There are no open sores, no preulcerative lesions, no rash or signs of infection present.  Thick dry skin around the heels.  No fissures are noted.  She also has thick callused areas to the plantar aspect of the bilateral heels.  Along the lateral foot.  F2  Vascular: Dorsalis Pedis artery and Posterior Tibial artery pedal pulses are 2/4 bilateral with immedate capillary fill time. Pedal hair growth present. No varicosities and no lower extremity edema present bilateral.   Neruologic: Grossly intact via light touch bilateral.  Vibratory intact via tuning fork bilateral. Protective threshold with Semmes Wienstein monofilament intact to all pedal sites bilateral. Patellar and Achilles deep tendon reflexes 2+ bilateral. No Babinski or clonus noted bilateral.   Musculoskeletal: No gross boney pedal deformities bilateral. No pain, crepitus, or limitation noted with foot and ankle range of motion bilateral. Muscular strength 5/5 in all groups tested bilateral.  Gait: Unassisted, Nonantalgic.    Radiographs:  None taken  Assessment & Plan:   Assessment: Tylomas and calluses secondary to open heeled shoes being worn the majority of the time.  Plan: Discussed etiology pathology and surgical therapies recommended that she obtain O'Keefe's hand and foot cream utilize this under occlusion with socks.  I also recommended close heeled shoes in her home.  And I will follow-up with her as needed.     Molly Burton, [place]

## 2021-07-23 ENCOUNTER — Other Ambulatory Visit: Payer: Self-pay

## 2021-07-23 ENCOUNTER — Ambulatory Visit (INDEPENDENT_AMBULATORY_CARE_PROVIDER_SITE_OTHER): Payer: No Typology Code available for payment source | Admitting: Nurse Practitioner

## 2021-07-23 ENCOUNTER — Encounter: Payer: Self-pay | Admitting: Nurse Practitioner

## 2021-07-23 ENCOUNTER — Encounter: Payer: Self-pay | Admitting: Allergy & Immunology

## 2021-07-23 ENCOUNTER — Ambulatory Visit (INDEPENDENT_AMBULATORY_CARE_PROVIDER_SITE_OTHER): Payer: No Typology Code available for payment source | Admitting: Allergy & Immunology

## 2021-07-23 VITALS — BP 144/99 | HR 68 | Ht 62.0 in | Wt 151.0 lb

## 2021-07-23 VITALS — BP 122/66 | HR 96 | Temp 98.6°F | Resp 16 | Ht 62.0 in | Wt 151.0 lb

## 2021-07-23 DIAGNOSIS — R519 Headache, unspecified: Secondary | ICD-10-CM

## 2021-07-23 DIAGNOSIS — E0590 Thyrotoxicosis, unspecified without thyrotoxic crisis or storm: Secondary | ICD-10-CM

## 2021-07-23 DIAGNOSIS — O165 Unspecified maternal hypertension, complicating the puerperium: Secondary | ICD-10-CM

## 2021-07-23 DIAGNOSIS — D62 Acute posthemorrhagic anemia: Secondary | ICD-10-CM | POA: Diagnosis not present

## 2021-07-23 DIAGNOSIS — Z0001 Encounter for general adult medical examination with abnormal findings: Secondary | ICD-10-CM | POA: Diagnosis not present

## 2021-07-23 DIAGNOSIS — J302 Other seasonal allergic rhinitis: Secondary | ICD-10-CM | POA: Diagnosis not present

## 2021-07-23 DIAGNOSIS — T7800XD Anaphylactic reaction due to unspecified food, subsequent encounter: Secondary | ICD-10-CM

## 2021-07-23 DIAGNOSIS — R5383 Other fatigue: Secondary | ICD-10-CM

## 2021-07-23 DIAGNOSIS — J3089 Other allergic rhinitis: Secondary | ICD-10-CM

## 2021-07-23 MED ORDER — AZELASTINE HCL 0.1 % NA SOLN: 2.0000 | Freq: Two times a day (BID) | NASAL | 5 refills | Status: DC

## 2021-07-23 MED ORDER — CETIRIZINE HCL 10 MG PO TABS: 10.0000 mg | ORAL_TABLET | Freq: Every day | ORAL | 5 refills | Status: DC

## 2021-07-23 MED ORDER — NIFEDIPINE ER OSMOTIC RELEASE 90 MG PO TB24: 90.0000 mg | ORAL_TABLET | Freq: Every day | ORAL | 1 refills | Status: DC

## 2021-07-23 MED ORDER — FLUTICASONE PROPIONATE 50 MCG/ACT NA SUSP: 2.0000 | Freq: Every day | NASAL | 5 refills | Status: DC

## 2021-07-23 MED ORDER — EPINEPHRINE 0.3 MG/0.3ML IJ SOAJ: 0.3000 mg | Freq: Once | INTRAMUSCULAR | 1 refills | Status: AC

## 2021-07-23 NOTE — Progress Notes (Signed)
Established Patient Office Visit  Subjective:  Patient ID: Molly Burton, female    DOB: 11-02-1996  Age: 25 y.o. MRN: [id number]  CC:  Chief Complaint  Patient presents with   Annual Exam    CPE    HPI [place] presents for physical exam.  She has fatigue and is sleeping 2-3 hours at a time between feedings. She is still breastfeeding.  She sees GYN for PAP screening.  Past Medical History:  Diagnosis Date   Hypertension     Past Surgical History:  Procedure Laterality Date   CESAREAN SECTION N/A 03/12/2019   Procedure: CESAREAN SECTION;  Surgeon: Truett Mainland, DO;  Location: [facility];  Service: Obstetrics;  Laterality: N/A;   CESAREAN SECTION MULTI-GESTATIONAL N/A 03/08/2021   Procedure: CESAREAN SECTION MULTI-GESTATIONAL;  Surgeon: Florian Buff, MD;  Location: [facility];  Service: Obstetrics;  Laterality: N/A;   TONSILLECTOMY      Family History  Problem Relation Age of Onset   Thyroid disease Maternal Grandmother    Thyroid disease Paternal Grandmother     Social History   Socioeconomic History   Marital status: Single    Spouse name: Not on file   Number of children: 3   Years of education: N/A   Highest education level: Bachelor's degree (e.g., BA, AB, BS)  Occupational History   Occupation: Manufacturing engineer [employer]    Comment: Insurance- Group coverage- underwriting  Tobacco Use   Smoking status: Never   Smokeless tobacco: Never  Vaping Use   Vaping Use: Never used  Substance and Sexual Activity   Alcohol use: Not Currently    Comment: Pt did have wine on 08/05/2018   Drug use: Not Currently    Types: Marijuana    Comment: not since 07/03/18   Sexual activity: Not Currently    Birth control/protection: None  Other Topics Concern   Not on file  Social History Narrative   Not on file   Social Determinants of Health   Financial Resource Strain: Low Risk    Difficulty of Paying Living Expenses: Not hard at all  Food Insecurity:  No Food Insecurity   Worried About Charity fundraiser in the Last Year: Never true   [place] in the Last Year: Never true  Transportation Needs: No Transportation Needs   Lack of Transportation (Medical): No   Lack of Transportation (Non-Medical): No  Physical Activity: Inactive   Days of Exercise per Week: 0 days   Minutes of Exercise per Session: 0 min  Stress: No Stress Concern Present   Feeling of Stress : Not at all  Social Connections: Moderately Isolated   Frequency of Communication with Friends and Family: More than three times a week   Frequency of Social Gatherings with Friends and Family: Once a week   Attends Religious Services: 1 to 4 times per year   Active Member of [employer] or Organizations: No   Attends Music therapist: Never   Marital Status: Never married  Human resources officer Violence: Not At Risk   Fear of Current or Ex-Partner: No   Emotionally Abused: No   Physically Abused: No   Sexually Abused: No    Outpatient Medications Prior to Visit  Medication Sig Dispense Refill   Prenatal Vit-Fe Fumarate-FA (PRENATAL MULTIVITAMIN) TABS tablet Take 1 tablet by mouth daily at 12 noon.     NIFEdipine (ADALAT CC) 60 MG 24 hr tablet Take 60 mg by mouth  daily.     No facility-administered medications prior to visit.    No Known Allergies  ROS Review of Systems  Constitutional:  Positive for fatigue. Negative for chills and fever.  HENT:  Positive for congestion. Negative for sinus pressure, sinus pain and sore throat.        Has allergist appointment today  Eyes: Negative.   Respiratory: Negative.    Cardiovascular: Negative.        BP has been elevated recently   Gastrointestinal: Negative.   Endocrine: Negative.   Genitourinary: Negative.   Musculoskeletal: Negative.   Skin: Negative.   Allergic/Immunologic: Negative.   Neurological:  Positive for headaches.       X3 weeks  Hematological: Negative.   Psychiatric/Behavioral: Negative.        Objective:    Physical Exam Constitutional:      Appearance: Normal appearance.  HENT:     Head: Normocephalic and atraumatic.     Right Ear: Tympanic membrane, ear canal and external ear normal.     Left Ear: Tympanic membrane, ear canal and external ear normal.     Nose: Nose normal.     Mouth/Throat:     Mouth: Mucous membranes are moist.     Pharynx: Oropharynx is clear.  Eyes:     Extraocular Movements: Extraocular movements intact.     Conjunctiva/sclera: Conjunctivae normal.     Pupils: Pupils are equal, round, and reactive to light.  Cardiovascular:     Rate and Rhythm: Normal rate and regular rhythm.     Pulses: Normal pulses.     Heart sounds: Normal heart sounds.  Pulmonary:     Effort: Pulmonary effort is normal.     Breath sounds: Normal breath sounds.  Abdominal:     General: Abdomen is flat. Bowel sounds are normal.     Palpations: Abdomen is soft.  Musculoskeletal:        General: Normal range of motion.     Cervical back: Normal range of motion and neck supple.  Skin:    General: Skin is warm and dry.     Capillary Refill: Capillary refill takes less than 2 seconds.  Neurological:     General: No focal deficit present.     Mental Status: She is alert and oriented to person, place, and time.     Cranial Nerves: No cranial nerve deficit.     Sensory: No sensory deficit.     Motor: No weakness.     Coordination: Coordination normal.     Gait: Gait normal.  Psychiatric:        Mood and Affect: Mood normal.        Behavior: Behavior normal.        Thought Content: Thought content normal.        Judgment: Judgment normal.    BP (!) 144/99 (BP Location: Left Arm, Patient Position: Sitting, Cuff Size: Large)   Pulse 68   Ht $R'5\' 2"'FD$  (1.575 m)   Wt 151 lb (68.5 kg)   SpO2 98%   Breastfeeding Yes   BMI 27.62 kg/m  Wt Readings from Last 3 Encounters:  07/23/21 151 lb (68.5 kg)  06/21/21 152 lb (68.9 kg)  04/22/21 147 lb (66.7 kg)     Health  Maintenance Due  Topic Date Due   PAP-Cervical Cytology Screening  08/29/2021   PAP SMEAR-Modifier  08/29/2021    There are no preventive care reminders to display for this patient.   Lab Results  Component Value Date   TSH 0.402 (L) 01/12/2021   Lab Results  Component Value Date   WBC 14.9 (H) 03/09/2021   HGB 9.8 (L) 03/09/2021   HCT 29.6 (L) 03/09/2021   MCV 93.4 03/09/2021   PLT 187 03/09/2021   Lab Results  Component Value Date   NA 137 03/08/2021   K 3.4 (L) 03/08/2021   CO2 20 (L) 03/08/2021   GLUCOSE 77 03/08/2021   BUN <5 (L) 03/08/2021   CREATININE 0.69 03/08/2021   BILITOT 0.4 03/08/2021   ALKPHOS 143 (H) 03/08/2021   AST 25 03/08/2021   ALT 20 03/08/2021   PROT 6.1 (L) 03/08/2021   ALBUMIN 2.8 (L) 03/08/2021   CALCIUM 8.4 (L) 03/08/2021   ANIONGAP 8 03/08/2021   No results found for: CHOL No results found for: HDL No results found for: LDLCALC No results found for: TRIG No results found for: CHOLHDL No results found for: HGBA1C    Assessment & Plan:   Problem List Items Addressed This Visit       Cardiovascular and Mediastinum   Postpartum hypertension   Relevant Medications   NIFEdipine (PROCARDIA XL/NIFEDICAL-XL) 90 MG 24 hr tablet   Other Relevant Orders   CBC with Differential/Platelet   CMP14+EGFR   Lipid Panel With LDL/HDL Ratio     Endocrine   Hyperthyroidism   Relevant Orders   TSH+T4F+T3Free     Other   Acute blood loss anemia    -checking labs; these were ordered, but she didn't have them drawn prior to the physical today      Relevant Orders   CBC with Differential/Platelet   Encounter for general adult medical examination with abnormal findings - Primary    -HTN on exam      Relevant Orders   CBC with Differential/Platelet   CMP14+EGFR   Lipid Panel With LDL/HDL Ratio   TSH+T4F+T3Free   Fatigue    -unsure of etiology; she will have labs drawn next week      Relevant Orders   TSH+T4F+T3Free   Headache     -ongoing x 3 weeks -unsure of etiology; DDx. Include HTN, migraine, sleep depravation, other -checking routine labs; increasing BP meds- will try to r/o HTN before treating for other causes since she is lactating and medication options may be limited      Relevant Medications   NIFEdipine (PROCARDIA XL/NIFEDICAL-XL) 90 MG 24 hr tablet    Meds ordered this encounter  Medications   NIFEdipine (PROCARDIA XL/NIFEDICAL-XL) 90 MG 24 hr tablet    Sig: Take 1 tablet (90 mg total) by mouth daily.    Dispense:  90 tablet    Refill:  1    Follow-up: Return in about 1 month (around 08/23/2021) for Med checkl (nifedipine).    Noreene Larsson, NP

## 2021-07-23 NOTE — Assessment & Plan Note (Signed)
-  checking labs; these were ordered, but she didn't have them drawn prior to the physical today

## 2021-07-23 NOTE — Progress Notes (Signed)
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 Seasonal and perennial allergic rhinitis - Testing today showed: grasses, ragweed, weeds, trees, indoor molds, outdoor molds, dust mites, cat, dog, and cockroach and mixed feather and mouse - Copy of test results provided.  - Avoidance measures provided. - Start taking: Zyrtec (cetirizine) 10mg  tablet once daily, Flonase (fluticasone) two sprays per nostril daily, and Astelin (azelastine) 2 sprays per nostril 1-2 times daily as needed - You can use an extra dose of the antihistamine, if needed, for breakthrough symptoms.  - Consider nasal saline rinses 1-2 times daily to remove allergens from the nasal cavities as well as help with mucous clearance (this is especially helpful to do before the nasal sprays are given) - We will start allergy shots as a means of long-term control. - Allergy shots "re-train" and "reset" the immune system to ignore environmental allergens and decrease the resulting immune response to those allergens (sneezing, itchy watery eyes, runny nose, nasal congestion, etc).    - Allergy shots improve symptoms in 75-85% of patients.  - Call your insurance company to check on coverage before making an appointment to start.   2. 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 - You might have experienced a cross contamination event when you reacted, but it could be a false negative. 02-15-1996 Training provided. - Anaphylaxis management plan provided.   2. Return in about 3 months (around 10/23/2021).    This note in its entirety was forwarded to the Provider who  requested this consultation.  Subjective:   Molly Burton is a 25 y.o. female presenting today for evaluation of  Chief Complaint  Patient presents with   Allergic Rhinitis     Says she is always sneezing and having a runny nose. Wants to se what she is allergic to    Molly Burton has a history of the following: Patient Active Problem List   Diagnosis Date Noted   Fatigue 07/23/2021   Headache 07/23/2021   Encounter for general adult medical examination with abnormal findings 06/21/2021   Callus of heel 06/21/2021   Postpartum hypertension 03/23/2021   Acute blood loss anemia 03/09/2021   History of cesarean delivery 09/16/2020   H/O pre-eclampsia 09/16/2020   Arthralgia    Hyperthyroidism 02/08/2018    History obtained from: chart review and patient.  Molly Burton was referred by 02/10/2018, NP.     Molly Burton is a 25 y.o. female presenting for an evaluation of chronic rhinitis .  Allergic Rhinitis Symptom History: She has had congestion since she was very young. It does not tend to get worse in a particular environment.  She has not used a nose spray or antihistamines. She does not get prednisone or antibiotics for this. This is her normal. She has never been tested at all. She has take Zyrtec in the past without improvement. She thinks that she has tried nose sprays. She did not feel that it was beneficial.    Food Allergy Symptom History: She reports that around 8-9 years ago, she had crab legs at the beach and she swelled up badly.  She took  Benadryl and got better. She did not have breathing problems. She does have some nose tingling when she smells it. She has avoided all shellfish since that time. She can eat regular dish.   She works at [employer].   Otherwise, there is no history of other atopic diseases, including asthma, drug allergies, stinging insect allergies, eczema, urticaria, or contact dermatitis. There is no significant infectious history.  Vaccinations are up to date.    Past Medical History: Patient Active Problem List   Diagnosis Date Noted   Fatigue 07/23/2021   Headache 07/23/2021   Encounter for general adult medical examination with abnormal findings 06/21/2021   Callus of heel 06/21/2021   Postpartum hypertension 03/23/2021   Acute blood loss anemia 03/09/2021   History of cesarean delivery 09/16/2020   H/O pre-eclampsia 09/16/2020   Arthralgia    Hyperthyroidism 02/08/2018    Medication List:  Allergies as of 07/23/2021   No Known Allergies      Medication List        Accurate as of July 23, 2021  5:09 PM. If you have any questions, ask your nurse or doctor.          azelastine 0.1 % nasal spray Commonly known as: ASTELIN Place 2 sprays into both nostrils 2 (two) times daily. Started by: Alfonse Spruce, MD   cetirizine 10 MG tablet Commonly known as: ZYRTEC Take 1 tablet (10 mg total) by mouth daily. Started by: Alfonse Spruce, MD   EPINEPHrine 0.3 mg/0.3 mL Soaj injection Commonly known as: EPI-PEN Inject 0.3 mg into the muscle once for 1 dose. As needed for life-threatening allergic reactions Started by: Alfonse Spruce, MD   fluticasone 50 MCG/ACT nasal spray Commonly known as: Flonase Place 2 sprays into both nostrils daily. Started by: Alfonse Spruce, MD   NIFEdipine 90 MG 24 hr tablet Commonly known as: PROCARDIA XL/NIFEDICAL-XL Take 1 tablet (90 mg total) by mouth daily. What changed:  medication strength how much to take Changed by: Heather Roberts, NP   prenatal multivitamin Tabs tablet Take 1 tablet by mouth daily at 12 noon.        Birth History: non-contributory  Developmental History: non-contributory  Past Surgical History: Past Surgical History:  Procedure Laterality Date   CESAREAN SECTION N/A 03/12/2019   Procedure: CESAREAN SECTION;  Surgeon: Levie Heritage, DO;  Location: [facility];  Service: Obstetrics;  Laterality: N/A;    CESAREAN SECTION MULTI-GESTATIONAL N/A 03/08/2021   Procedure: CESAREAN SECTION MULTI-GESTATIONAL;  Surgeon: Lazaro Arms, MD;  Location: [facility];  Service: Obstetrics;  Laterality: N/A;   TONSILLECTOMY       Family History: Family History  Problem Relation Age of Onset   Thyroid disease Maternal Grandmother    Thyroid disease Paternal Grandmother      Social History: Molly Burton lives at home with her family. She has a 26yo and new twin babies. She also lives with her husband. They live in a house that is 79 years old.  There is hardwood throughout the home.  She has electric heating and central cooling.  There is a dog inside and outside of the home.  There are no dust mite covers on the bedding.  There is no tobacco exposure.  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  She has done this for 1 year.  There are no fume, chemicals, or dust.  They do not use a HEPA filter.  There is  no tobacco exposure.     Review of Systems  Constitutional: Negative.  Negative for chills, fever, malaise/fatigue and weight loss.  HENT:  Positive for congestion and sinus pain. Negative for ear discharge and ear pain.   Eyes:  Negative for pain, discharge and redness.  Respiratory:  Negative for cough, sputum production, shortness of breath and wheezing.   Cardiovascular: Negative.  Negative for chest pain and palpitations.  Gastrointestinal:  Negative for abdominal pain, constipation, diarrhea, heartburn, nausea and vomiting.  Skin: Negative.  Negative for itching and rash.  Neurological:  Negative for dizziness and headaches.  Endo/Heme/Allergies:  Positive for environmental allergies. Does not bruise/bleed easily.      Objective:   Blood pressure 122/66, pulse 96, temperature 98.6 F (37 C), temperature source Temporal, resp. rate 16, height 5\' 2"  (1.575 m), weight 151 lb (68.5 kg), SpO2 97 %, currently breastfeeding. Body mass index is 27.62 kg/m.   Physical  Exam:   Physical Exam Vitals reviewed.  Constitutional:      Appearance: She is well-developed.     Comments: Very pleasant.  Talkative.  HENT:     Head: Normocephalic and atraumatic.     Right Ear: Tympanic membrane, ear canal and external ear normal. No drainage, swelling or tenderness. Tympanic membrane is not injected, scarred, erythematous, retracted or bulging.     Left Ear: Tympanic membrane, ear canal and external ear normal. No drainage, swelling or tenderness. Tympanic membrane is not injected, scarred, erythematous, retracted or bulging.     Nose: No nasal deformity, septal deviation, mucosal edema or rhinorrhea.     Right Turbinates: Enlarged, swollen and pale.     Left Turbinates: Enlarged, swollen and pale.     Right Sinus: No maxillary sinus tenderness or frontal sinus tenderness.     Left Sinus: No maxillary sinus tenderness or frontal sinus tenderness.     Mouth/Throat:     Mouth: Mucous membranes are not pale and not dry.     Pharynx: Uvula midline.  Eyes:     General: Allergic shiner present.        Right eye: No discharge.        Left eye: No discharge.     Conjunctiva/sclera: Conjunctivae normal.     Right eye: Right conjunctiva is not injected. No chemosis.    Left eye: Left conjunctiva is not injected. No chemosis.    Pupils: Pupils are equal, round, and reactive to light.  Cardiovascular:     Rate and Rhythm: Normal rate and regular rhythm.     Heart sounds: Normal heart sounds.  Pulmonary:     Effort: Pulmonary effort is normal. No tachypnea, accessory muscle usage or respiratory distress.     Breath sounds: Normal breath sounds. No wheezing, rhonchi or rales.     Comments: Moving air well in all lung fields.  No increased work of breathing. Chest:     Chest wall: No tenderness.  Abdominal:     Tenderness: There is no abdominal tenderness. There is no guarding or rebound.  Lymphadenopathy:     Head:     Right side of head: No submandibular, tonsillar  or occipital adenopathy.     Left side of head: No submandibular, tonsillar or occipital adenopathy.     Cervical: No cervical adenopathy.  Skin:    Coloration: Skin is not pale.     Findings: No abrasion, erythema, petechiae or rash. Rash is not papular, urticarial or vesicular.  Neurological:     Mental  Status: She is alert.  Psychiatric:        Behavior: Behavior is cooperative.     Diagnostic studies:   Allergy Studies:     Airborne Adult Perc - 07/23/21 1401     Time Antigen Placed 1401    Allergen Manufacturer Waynette ButteryGreer    Location Back    Number of Test 59    1. Control-Buffer 50% Glycerol Negative    2. Control-Histamine 1 mg/ml Negative    3. Albumin saline Negative    4. Bahia 4+    5. [place] 3+    6. Johnson 3+    7. Kentucky Blue 3+    8. Meadow Fescue 3+    9. Perennial Rye 4+    10. Sweet Vernal 4+    11. Timothy 2+    12. Cocklebur 2+    13. Burweed Mars[place]er 2+    14. Ragweed, short 2+    15. Ragweed, Giant Negative    16. Plantain,  English 2+    17. Lamb's Quarters 2+    18. Sheep Sorrell 2+    19. Rough Pigweed 2+    20. Marsh Elder, Rough 2+    21. Mugwort, Common 2+    22. Ash mix Negative    23. Birch mix Negative    24. Beech American Negative    25. Box, Elder Negative    26. Cedar, red Negative    27. Cottonwood, Eastern 2+    28. Elm mix 2+    29. Hickory 2+    30. Maple mix 3+    31. Oak, [place] mix 2+    32. Pecan Pollen 4+    33. Pine mix 4+    34. Sycamore Eastern Negative    35. Walnut, Black Pollen 3+    36. Alternaria alternata Negative    37. Cladosporium Herbarum Negative    38. Aspergillus mix Negative    39. Penicillium mix Negative    40. Bipolaris sorokiniana (Helminthosporium) Negative    41. Drechslera spicifera (Curvularia) Negative    42. Mucor plumbeus 2+    43. Fusarium moniliforme Negative    44. Aureobasidium pullulans (pullulara) Negative    45. Rhizopus oryzae Negative    46. Botrytis cinera Negative     47. Epicoccum nigrum Negative    48. Phoma betae Negative    49. Candida Albicans Negative    50. Trichophyton mentagrophytes Negative    51. Mite, D Farinae  5,000 AU/ml 2+    52. Mite, D Pteronyssinus  5,000 AU/ml 2+    53. Cat Hair 10,000 BAU/ml 2+    54.  Dog Epithelia 4+    55. Mixed Feathers 2+    56. Horse Epithelia 3+    57. Cockroach, German 3+    58. Mouse 2+    59. Tobacco Leaf Negative             Intradermal - 07/23/21 1448     Time Antigen Placed 1449    Allergen Manufacturer Waynette ButteryGreer    Location Arm    Number of Test 4    Control Negative    Mold 1 1+    Mold 2 Negative    Mold 4 2+             Food Adult Perc - 07/23/21 1400     Time Antigen Placed 1449    Allergen Manufacturer Waynette ButteryGreer    Location Arm    Number of allergen test 8  Control-buffer 50% Glycerol Negative    Control-Histamine 1 mg/ml 2+    8. Shellfish Mix --   10x15   25. Shrimp --   5x9   26. Crab Negative    27. Lobster --   5x8   28. Oyster --   2x3   29. Scallops --   2x3            Allergy testing results were read and interpreted by myself, documented by clinical staff.         Malachi Bonds, MD [facility]

## 2021-07-23 NOTE — Assessment & Plan Note (Signed)
-  HTN on exam

## 2021-07-23 NOTE — Patient Instructions (Addendum)
1. Seasonal and perennial allergic rhinitis - Testing today showed: grasses, ragweed, weeds, trees, indoor molds, outdoor molds, dust mites, cat, dog, and cockroach and mixed feather and mouse - Copy of test results provided.  - Avoidance measures provided. - Start taking: Zyrtec (cetirizine) 10mg  tablet once daily, Flonase (fluticasone) two sprays per nostril daily, and Astelin (azelastine) 2 sprays per nostril 1-2 times daily as needed - You can use an extra dose of the antihistamine, if needed, for breakthrough symptoms.  - Consider nasal saline rinses 1-2 times daily to remove allergens from the nasal cavities as well as help with mucous clearance (this is especially helpful to do before the nasal sprays are given) - We will start allergy shots as a means of long-term control. - Allergy shots "re-train" and "reset" the immune system to ignore environmental allergens and decrease the resulting immune response to those allergens (sneezing, itchy watery eyes, runny nose, nasal congestion, etc).    - Allergy shots improve symptoms in 75-85% of patients.  - Call your insurance company to check on coverage before making an appointment to start.   2. 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 - You might have experienced a cross contamination event when you reacted, but it could be a false negative. 02[birth year]1996 Training provided. - Anaphylaxis management plan provided.   2. Return in about 3 months (around 10/23/2021).    Please inform 10/25/2021 of any Emergency Department visits, hospitalizations, or changes in symptoms. Call us before going to the ED for breathing or allergy symptoms since we might be able to fit you in for a sick visit. Feel free to contact us anytime with any questions, problems, or concerns.  It was a pleasure to see you again today!  Websites that have reliable patient information: 1. American Academy of Asthma, Allergy, and  Immunology: www.aaaai.org 2. Food Allergy Research and Education (FARE): foodallergy.org 3. Mothers of Asthmatics: http://www.asthmacommunitynetwork.org 4. American College of Allergy, Asthma, and Immunology: www.acaai.org   COVID-19 Vaccine Information can be found at: [name] For questions related to vaccine distribution or appointments, please email [email or web address] or call [phone].   We realize that you might be concerned about having an allergic reaction to the COVID19 vaccines. To help with that concern, WE ARE OFFERING THE COVID19 VACCINES IN OUR OFFICE! Ask the front desk for dates!     "Like" [phone] on Facebook and Instagram for our latest updates!      A healthy democracy works best when [name] participate! Make sure you are registered to vote! If you have moved or changed any of your contact information, you will need to get this updated before voting!  In some cases, you MAY be able to register to vote online: [employer]      1. Control-Buffer 50% Glycerol Negative   2. Control-Histamine 1 mg/ml Negative   3. Albumin saline Negative   4. Bahia 4+   5. AromatherapyCrystals.be 3+   6. Johnson 3+   7. [place] Blue 3+   8. Meadow Fescue 3+   9. Perennial Rye 4+   10. Sweet Vernal 4+   11. Timothy 2+   12. Cocklebur 2+   13. Burweed Marshelder 2+   14. Ragweed, short 2+   15. Ragweed, Giant Negative   16. Plantain,  English 2+   17. Lamb's Quarters 2+   18. Sheep Sorrell 2+   19. Rough Pigweed 2+   20. [place] Elder, Rough 2+  21. Mugwort, Common 2+   22. Ash mix Negative   23. Birch mix Negative   24. Beech American Negative   25. Box, Elder Negative   26. Cedar, red Negative   27. Cottonwood, Eastern 2+   28. Elm mix 2+   29. Hickory 2+   30. Maple mix 3+   31. Oak, [place] mix 2+   32. Pecan Pollen 4+   33. Pine mix 4+   34. Sycamore Eastern  Negative   35. Walnut, Black Pollen 3+   36. Alternaria alternata Negative   37. Cladosporium Herbarum Negative   38. Aspergillus mix Negative   39. Penicillium mix Negative   40. Bipolaris sorokiniana (Helminthosporium) Negative   41. Drechslera spicifera (Curvularia) Negative   42. Mucor plumbeus 2+   43. Fusarium moniliforme Negative   44. Aureobasidium pullulans (pullulara) Negative   45. Rhizopus oryzae Negative   46. Botrytis cinera Negative   47. Epicoccum nigrum Negative   48. Phoma betae Negative   49. Candida Albicans Negative   50. Trichophyton mentagrophytes Negative   51. Mite, D Farinae  5,000 AU/ml 2+   52. Mite, D Pteronyssinus  5,000 AU/ml 2+   53. Cat Hair 10,000 BAU/ml 2+   54.  Dog Epithelia 4+   55. Mixed Feathers 2+   56. Horse Epithelia 3+   57. Cockroach, German 3+   58. Mouse 2+   59. Tobacco Leaf Negative    Control Negative   Mold 1 1+   Mold 2 Negative   Mold 4 2+     Reducing Pollen Exposure  The American Academy of Allergy, Asthma and Immunology suggests the following steps to reduce your exposure to pollen during allergy seasons.    Do not hang sheets or clothing out to dry; pollen may collect on these items. Do not mow lawns or spend time around freshly cut grass; mowing stirs up pollen. Keep windows closed at night.  Keep car windows closed while driving. Minimize morning activities outdoors, a time when pollen counts are usually at their highest. Stay indoors as much as possible when pollen counts or humidity is high and on windy days when pollen tends to remain in the air longer. Use air conditioning when possible.  Many air conditioners have filters that trap the pollen spores. Use a HEPA room air filter to remove pollen form the indoor air you breathe.  Control of Mold Allergen   Mold and fungi can grow on a variety of surfaces provided certain temperature and moisture conditions exist.  Outdoor molds grow on plants, decaying  vegetation and soil.  The major outdoor mold, Alternaria and Cladosporium, are found in very high numbers during hot and dry conditions.  Generally, a late Summer - Fall peak is seen for common outdoor fungal spores.  Rain will temporarily lower outdoor mold spore count, but counts rise rapidly when the rainy period ends.  The most important indoor molds are Aspergillus and Penicillium.  Dark, humid and poorly ventilated basements are ideal sites for mold growth.  The next most common sites of mold growth are the bathroom and the kitchen.  Outdoor (Seasonal) Mold Control   Use air conditioning and keep windows closed Avoid exposure to decaying vegetation. Avoid leaf raking. Avoid grain handling. Consider wearing a face mask if working in moldy areas.    Indoor (Perennial) Mold Control     Maintain humidity below 50%. Clean washable surfaces with 5% bleach solution. Remove sources e.g. contaminated carpets.  Control of Dust Mite Allergen    Dust mites play a major role in allergic asthma and rhinitis.  They occur in environments with high humidity wherever human skin is found.  Dust mites absorb humidity from the atmosphere (ie, they do not drink) and feed on organic matter (including shed human and animal skin).  Dust mites are a microscopic type of insect that you cannot see with the naked eye.  High levels of dust mites have been detected from mattresses, pillows, carpets, upholstered furniture, bed covers, clothes, soft toys and any woven material.  The principal allergen of the dust mite is found in its feces.  A gram of dust may contain 1,000 mites and 250,000 fecal particles.  Mite antigen is easily measured in the air during house cleaning activities.  Dust mites do not bite and do not cause harm to humans, other than by triggering allergies/asthma.    Ways to decrease your exposure to dust mites in your home:  Encase mattresses, box springs and pillows with a mite-impermeable  barrier or cover   Wash sheets, blankets and drapes weekly in hot water (130 F) with detergent and dry them in a dryer on the hot setting.  Have the room cleaned frequently with a vacuum cleaner and a damp dust-mop.  For carpeting or rugs, vacuuming with a vacuum cleaner equipped with a high-efficiency particulate air (HEPA) filter.  The dust mite allergic individual should not be in a room which is being cleaned and should wait 1 hour after cleaning before going into the room. Do not sleep on upholstered furniture (eg, couches).   If possible removing carpeting, upholstered furniture and drapery from the home is ideal.  Horizontal blinds should be eliminated in the rooms where the person spends the most time (bedroom, study, television room).  Washable vinyl, roller-type shades are optimal. Remove all non-washable stuffed toys from the bedroom.  Wash stuffed toys weekly like sheets and blankets above.   Reduce indoor humidity to less than 50%.  Inexpensive humidity monitors can be purchased at most hardware stores.  Do not use a humidifier as can make the problem worse and are not recommended.  Control of Dog or Cat Allergen  Avoidance is the best way to manage a dog or cat allergy. If you have a dog or cat and are allergic to dog or cats, consider removing the dog or cat from the home. If you have a dog or cat but don't want to find it a new home, or if your family wants a pet even though someone in the household is allergic, here are some strategies that may help keep symptoms at bay:  Keep the pet out of your bedroom and restrict it to only a few rooms. Be advised that keeping the dog or cat in only one room will not limit the allergens to that room. Don't pet, hug or kiss the dog or cat; if you do, wash your hands with soap and water. High-efficiency particulate air (HEPA) cleaners run continuously in a bedroom or living room can reduce allergen levels over time. Regular use of a  high-efficiency vacuum cleaner or a central vacuum can reduce allergen levels. Giving your dog or cat a bath at least once a week can reduce airborne allergen.  Control of Cockroach Allergen  Cockroach allergen has been identified as an important cause of acute attacks of asthma, especially in urban settings.  There are fifty-five species of cockroach that exist in [place], however  only three, the [place], [place] and [place] species produce allergen that can affect patients with Asthma.  Allergens can be obtained from fecal particles, egg casings and secretions from cockroaches.    Remove food sources. Reduce access to water. Seal access and entry points. Spray runways with 0.5-1% Diazinon or Chlorpyrifos Blow boric acid power under stoves and refrigerator. Place bait stations (hydramethylnon) at feeding sites.  Allergy Shots   Allergies are the result of a chain reaction that starts in the immune system. Your immune system controls how your body defends itself. For instance, if you have an allergy to pollen, your immune system identifies pollen as an invader or allergen. Your immune system overreacts by producing antibodies called Immunoglobulin E (IgE). These antibodies travel to cells that release chemicals, causing an allergic reaction.  The concept behind allergy immunotherapy, whether it is received in the form of shots or tablets, is that the immune system can be desensitized to specific allergens that trigger allergy symptoms. Although it requires time and patience, the payback can be long-term relief.  How Do Allergy Shots Work?  Allergy shots work much like a vaccine. Your body responds to injected amounts of a particular allergen given in increasing doses, eventually developing a resistance and tolerance to it. Allergy shots can lead to decreased, minimal or no allergy symptoms.  There generally are two phases: build-up and maintenance. Build-up often ranges from three  to six months and involves receiving injections with increasing amounts of the allergens. The shots are typically given once or twice a week, though more rapid build-up schedules are sometimes used.  The maintenance phase begins when the most effective dose is reached. This dose is different for each person, depending on how allergic you are and your response to the build-up injections. Once the maintenance dose is reached, there are longer periods between injections, typically two to four weeks.  Occasionally doctors give cortisone-type shots that can temporarily reduce allergy symptoms. These types of shots are different and should not be confused with allergy immunotherapy shots.  Who Can Be Treated with Allergy Shots?  Allergy shots may be a good treatment approach for people with allergic rhinitis (hay fever), allergic asthma, conjunctivitis (eye allergy) or stinging insect allergy.   Before deciding to begin allergy shots, you should consider:   The length of allergy season and the severity of your symptoms  Whether medications and/or changes to your environment can control your symptoms  Your desire to avoid long-term medication use  Time: allergy immunotherapy requires a major time commitment  Cost: may vary depending on your insurance coverage  Allergy shots for children age 9 and older are effective and often well tolerated. They might prevent the onset of new allergen sensitivities or the progression to asthma.  Allergy shots are not started on patients who are pregnant but can be continued on patients who become pregnant while receiving them. In some patients with other medical conditions or who take certain common medications, allergy shots may be of risk. It is important to mention other medications you talk to your allergist.   When Will I Feel Better?  Some may experience decreased allergy symptoms during the build-up phase. For others, it may take as long as 12 months on  the maintenance dose. If there is no improvement after a year of maintenance, your allergist will discuss other treatment options with you.  If you aren't responding to allergy shots, it may be because there is not enough dose of the allergen  in your vaccine or there are missing allergens that were not identified during your allergy testing. Other reasons could be that there are high levels of the allergen in your environment or major exposure to non-allergic triggers like tobacco smoke.  What Is the Length of Treatment?  Once the maintenance dose is reached, allergy shots are generally continued for three to five years. The decision to stop should be discussed with your allergist at that time. Some people may experience a permanent reduction of allergy symptoms. Others may relapse and a longer course of allergy shots can be considered.  What Are the Possible Reactions?  The two types of adverse reactions that can occur with allergy shots are local and systemic. Common local reactions include very mild redness and swelling at the injection site, which can happen immediately or several hours after. A systemic reaction, which is less common, affects the entire body or a particular body system. They are usually mild and typically respond quickly to medications. Signs include increased allergy symptoms such as sneezing, a stuffy nose or hives.  Rarely, a serious systemic reaction called anaphylaxis can develop. Symptoms include swelling in the throat, wheezing, a feeling of tightness in the chest, nausea or dizziness. Most serious systemic reactions develop within 30 minutes of allergy shots. This is why it is strongly recommended you wait in your doctor's office for 30 minutes after your injections. Your allergist is trained to watch for reactions, and his or her staff is trained and equipped with the proper medications to identify and treat them.  Who Should Administer Allergy Shots?  The preferred  location for receiving shots is your prescribing allergist's office. Injections can sometimes be given at another facility where the physician and staff are trained to recognize and treat reactions, and have received instructions by your prescribing allergist.

## 2021-07-23 NOTE — Patient Instructions (Addendum)
Please have labs drawn this week 

## 2021-07-23 NOTE — Assessment & Plan Note (Signed)
-  unsure of etiology; she will have labs drawn next week

## 2021-07-23 NOTE — Assessment & Plan Note (Signed)
-  ongoing x 3 weeks -unsure of etiology; DDx. Include HTN, migraine, sleep depravation, other -checking routine labs; increasing BP meds- will try to r/o HTN before treating for other causes since she is lactating and medication options may be limited

## 2021-07-27 ENCOUNTER — Other Ambulatory Visit: Payer: Self-pay | Admitting: Nurse Practitioner

## 2021-07-27 DIAGNOSIS — E876 Hypokalemia: Secondary | ICD-10-CM

## 2021-07-27 LAB — CBC WITH DIFFERENTIAL/PLATELET
Basophils Absolute: 0.1 10*3/uL (ref 0.0–0.2)
Basos: 1 %
EOS (ABSOLUTE): 0.2 10*3/uL (ref 0.0–0.4)
Eos: 3 %
Hematocrit: 38.5 % (ref 34.0–46.6)
Hemoglobin: 13 g/dL (ref 11.1–15.9)
Immature Grans (Abs): 0 10*3/uL (ref 0.0–0.1)
Immature Granulocytes: 0 %
Lymphocytes Absolute: 1.6 10*3/uL (ref 0.7–3.1)
Lymphs: 20 %
MCH: 28.2 pg (ref 26.6–33.0)
MCHC: 33.8 g/dL (ref 31.5–35.7)
MCV: 84 fL (ref 79–97)
Monocytes Absolute: 0.3 10*3/uL (ref 0.1–0.9)
Monocytes: 4 %
Neutrophils Absolute: 6 10*3/uL (ref 1.4–7.0)
Neutrophils: 72 %
Platelets: 267 10*3/uL (ref 150–450)
RBC: 4.61 x10E6/uL (ref 3.77–5.28)
RDW: 12.7 % (ref 11.7–15.4)
WBC: 8.2 10*3/uL (ref 3.4–10.8)

## 2021-07-27 LAB — TSH+T4F+T3FREE
Free T4: 1.31 ng/dL (ref 0.82–1.77)
T3, Free: 3.6 pg/mL (ref 2.0–4.4)
TSH: 0.574 u[IU]/mL (ref 0.450–4.500)

## 2021-07-27 LAB — CMP14+EGFR
ALT: 22 IU/L (ref 0–32)
AST: 15 IU/L (ref 0–40)
Albumin/Globulin Ratio: 2 (ref 1.2–2.2)
Albumin: 5.3 g/dL — ABNORMAL HIGH (ref 3.9–5.0)
Alkaline Phosphatase: 147 IU/L — ABNORMAL HIGH (ref 44–121)
BUN/Creatinine Ratio: 23 (ref 9–23)
BUN: 19 mg/dL (ref 6–20)
Bilirubin Total: 0.4 mg/dL (ref 0.0–1.2)
CO2: 21 mmol/L (ref 20–29)
Calcium: 9.7 mg/dL (ref 8.7–10.2)
Chloride: 102 mmol/L (ref 96–106)
Creatinine, Ser: 0.84 mg/dL (ref 0.57–1.00)
Globulin, Total: 2.6 g/dL (ref 1.5–4.5)
Glucose: 77 mg/dL (ref 65–99)
Potassium: 3.3 mmol/L — ABNORMAL LOW (ref 3.5–5.2)
Sodium: 144 mmol/L (ref 134–144)
Total Protein: 7.9 g/dL (ref 6.0–8.5)
eGFR: 98 mL/min/{1.73_m2} (ref >59)

## 2021-07-27 LAB — LIPID PANEL WITH LDL/HDL RATIO
Cholesterol, Total: 191 mg/dL (ref 100–199)
HDL: 60 mg/dL (ref >39)
LDL Chol Calc (NIH): 120 mg/dL — ABNORMAL HIGH (ref 0–99)
LDL/HDL Ratio: 2 ratio (ref 0.0–3.2)
Triglycerides: 60 mg/dL (ref 0–149)
VLDL Cholesterol Cal: 11 mg/dL (ref 5–40)

## 2021-07-27 MED ORDER — POTASSIUM CHLORIDE CRYS ER 20 MEQ PO TBCR: 20.0000 meq | EXTENDED_RELEASE_TABLET | Freq: Every day | ORAL | 0 refills | Status: DC

## 2021-07-27 NOTE — Progress Notes (Signed)
Labs look ok, albumin is higher than previous, but we will watch that with routine labs. Potassium was just a little low, so I will send in a potassium supplement to take for a week.

## 2021-08-23 ENCOUNTER — Ambulatory Visit: Payer: No Typology Code available for payment source | Admitting: Nurse Practitioner

## 2021-10-05 ENCOUNTER — Ambulatory Visit: Payer: No Typology Code available for payment source | Admitting: Internal Medicine

## 2021-12-13 ENCOUNTER — Ambulatory Visit (INDEPENDENT_AMBULATORY_CARE_PROVIDER_SITE_OTHER): Payer: No Typology Code available for payment source | Admitting: Nurse Practitioner

## 2021-12-13 ENCOUNTER — Other Ambulatory Visit: Payer: Self-pay

## 2021-12-13 ENCOUNTER — Encounter: Payer: Self-pay | Admitting: Nurse Practitioner

## 2021-12-13 VITALS — BP 127/86 | HR 69 | Ht 62.0 in | Wt 134.0 lb

## 2021-12-13 DIAGNOSIS — O165 Unspecified maternal hypertension, complicating the puerperium: Secondary | ICD-10-CM

## 2021-12-13 NOTE — Progress Notes (Addendum)
° °  Molly Burton     MRN: [id number]      DOB: 1996-02-05   HPI Molly Burton is here for follow up for BP. She stopped taking her BP med and she started having   Headache. She is still breast feeding. Resumed taking her BP med about a week ago.  Refused flu vaccine today, need to get vaccinated discussed with pt, she verbalized understanding.   ROS Denies recent fever or chills. Denies sinus pressure, nasal congestion, ear pain or sore throat. Denies chest congestion, productive cough or wheezing. Denies chest pains, palpitations and leg swelling Denies abdominal pain, nausea, vomiting,diarrhea or constipation.   Denies dysuria, frequency, hesitancy or incontinence. Denies joint pain, swelling and limitation in mobility. Denies headaches, seizures, numbness, or tingling. Denies depression, anxiety or insomnia. Denies skin break down or rash.   PE  BP 127/86 (BP Location: Right Arm, Patient Position: Sitting, Cuff Size: Normal)    Pulse 69    Ht 5\' 2"  (1.575 m)    Wt 134 lb (60.8 kg)    SpO2 93%    Breastfeeding Yes    BMI 24.51 kg/m   Patient alert and oriented and in no cardiopulmonary distress.    Chest: Clear to auscultation bilaterally.  CVS: S1, S2 no murmurs, no S3.Regular rate.  ABD: Soft non tender.   Ext: No edema  MS: Adequate ROM spine, shoulders, hips and knees.  Skin: Intact, no ulcerations or rash noted.  Psych: Good eye contact, normal affect. Memory intact not anxious or depressed appearing.   Assessment & Plan

## 2021-12-13 NOTE — Assessment & Plan Note (Signed)
DASH diet and commitment to daily physical activity for a minimum of 30 minutes discussed and encouraged, as a part of hypertension management. The importance of attaining a healthy weight is also discussed.  BP/Weight 12/13/2021 07/23/2021 07/23/2021 06/27/2021 06/21/2021 04/22/2021 03/29/2021  Systolic BP 127 144 122 133 160 126 133  Diastolic BP 86 99 66 86 95 79 92  Wt. (Lbs) 134 151 151 - 152 147 -  BMI 24.51 27.62 27.62 - 27.8 26.89 -   continue nifedipine 90mg  daily.

## 2021-12-13 NOTE — Patient Instructions (Signed)
Please get your labs done today  ° ° °It is important that you exercise regularly at least 30 minutes 5 times a week.  °Think about what you will eat, plan ahead. °Choose " clean, green, fresh or frozen" over canned, processed or packaged foods which are more sugary, salty and fatty. °70 to 75% of food eaten should be vegetables and fruit. °Three meals at set times with snacks allowed between meals, but they must be fruit or vegetables. °Aim to eat over a 12 hour period , example 7 am to 7 pm, and STOP after  your last meal of the day. °Drink water,generally about 64 ounces per day, no other drink is as healthy. Fruit juice is best enjoyed in a healthy way, by EATING the fruit. ° °Thanks for choosing [facility], we consider it a privelige to serve you. ° °

## 2021-12-14 ENCOUNTER — Other Ambulatory Visit: Payer: Self-pay | Admitting: Nurse Practitioner

## 2021-12-14 DIAGNOSIS — R748 Abnormal levels of other serum enzymes: Secondary | ICD-10-CM

## 2021-12-14 LAB — CMP14+EGFR
ALT: 13 IU/L (ref 0–32)
AST: 16 IU/L (ref 0–40)
Albumin/Globulin Ratio: 1.7 (ref 1.2–2.2)
Albumin: 4.9 g/dL (ref 3.9–5.0)
Alkaline Phosphatase: 137 IU/L — ABNORMAL HIGH (ref 44–121)
BUN/Creatinine Ratio: 20 (ref 9–23)
BUN: 14 mg/dL (ref 6–20)
Bilirubin Total: 0.4 mg/dL (ref 0.0–1.2)
CO2: 21 mmol/L (ref 20–29)
Calcium: 9.5 mg/dL (ref 8.7–10.2)
Chloride: 104 mmol/L (ref 96–106)
Creatinine, Ser: 0.7 mg/dL (ref 0.57–1.00)
Globulin, Total: 2.9 g/dL (ref 1.5–4.5)
Glucose: 78 mg/dL (ref 70–99)
Potassium: 3.7 mmol/L (ref 3.5–5.2)
Sodium: 143 mmol/L (ref 134–144)
Total Protein: 7.8 g/dL (ref 6.0–8.5)
eGFR: 122 mL/min/{1.73_m2} (ref >59)

## 2021-12-15 ENCOUNTER — Encounter: Payer: Self-pay | Admitting: Internal Medicine

## 2021-12-16 ENCOUNTER — Encounter (HOSPITAL_COMMUNITY): Payer: Self-pay

## 2021-12-16 ENCOUNTER — Other Ambulatory Visit: Payer: Self-pay

## 2021-12-16 ENCOUNTER — Emergency Department (HOSPITAL_COMMUNITY)
Admission: EM | Admit: 2021-12-16 | Discharge: 2021-12-16 | Disposition: A | Discharge status: Home / Self Care | Payer: No Typology Code available for payment source | Attending: Emergency Medicine | Admitting: Emergency Medicine

## 2021-12-16 ENCOUNTER — Telehealth: Payer: Self-pay | Admitting: Nurse Practitioner

## 2021-12-16 ENCOUNTER — Emergency Department (HOSPITAL_COMMUNITY): Payer: No Typology Code available for payment source

## 2021-12-16 ENCOUNTER — Other Ambulatory Visit: Payer: Self-pay | Admitting: Nurse Practitioner

## 2021-12-16 ENCOUNTER — Telehealth: Payer: Self-pay

## 2021-12-16 DIAGNOSIS — R000 Tachycardia, unspecified: Secondary | ICD-10-CM | POA: Diagnosis not present

## 2021-12-16 DIAGNOSIS — D72829 Elevated white blood cell count, unspecified: Secondary | ICD-10-CM | POA: Diagnosis not present

## 2021-12-16 DIAGNOSIS — R0602 Shortness of breath: Secondary | ICD-10-CM | POA: Insufficient documentation

## 2021-12-16 DIAGNOSIS — E0590 Thyrotoxicosis, unspecified without thyrotoxic crisis or storm: Secondary | ICD-10-CM

## 2021-12-16 DIAGNOSIS — R7989 Other specified abnormal findings of blood chemistry: Secondary | ICD-10-CM | POA: Diagnosis not present

## 2021-12-16 DIAGNOSIS — R002 Palpitations: Secondary | ICD-10-CM | POA: Diagnosis not present

## 2021-12-16 DIAGNOSIS — Z20822 Contact with and (suspected) exposure to covid-19: Secondary | ICD-10-CM | POA: Insufficient documentation

## 2021-12-16 DIAGNOSIS — R062 Wheezing: Secondary | ICD-10-CM | POA: Diagnosis not present

## 2021-12-16 LAB — CBC WITH DIFFERENTIAL/PLATELET
Abs Immature Granulocytes: 0.05 10*3/uL (ref 0.00–0.07)
Basophils Absolute: 0 10*3/uL (ref 0.0–0.1)
Basophils Relative: 0 %
Eosinophils Absolute: 0.1 10*3/uL (ref 0.0–0.5)
Eosinophils Relative: 1 %
HCT: 47.3 % — ABNORMAL HIGH (ref 36.0–46.0)
Hemoglobin: 15.4 g/dL — ABNORMAL HIGH (ref 12.0–15.0)
Immature Granulocytes: 0 %
Lymphocytes Relative: 7 %
Lymphs Abs: 0.8 10*3/uL (ref 0.7–4.0)
MCH: 28.3 pg (ref 26.0–34.0)
MCHC: 32.6 g/dL (ref 30.0–36.0)
MCV: 86.9 fL (ref 80.0–100.0)
Monocytes Absolute: 0.3 10*3/uL (ref 0.1–1.0)
Monocytes Relative: 3 %
Neutro Abs: 10.5 10*3/uL — ABNORMAL HIGH (ref 1.7–7.7)
Neutrophils Relative %: 89 %
Platelets: 208 10*3/uL (ref 150–400)
RBC: 5.44 MIL/uL — ABNORMAL HIGH (ref 3.87–5.11)
RDW: 12.8 % (ref 11.5–15.5)
WBC: 11.8 10*3/uL — ABNORMAL HIGH (ref 4.0–10.5)
nRBC: 0 % (ref 0.0–0.2)

## 2021-12-16 LAB — COMPREHENSIVE METABOLIC PANEL
ALT: 17 U/L (ref 0–44)
AST: 19 U/L (ref 15–41)
Albumin: 5.2 g/dL — ABNORMAL HIGH (ref 3.5–5.0)
Alkaline Phosphatase: 123 U/L (ref 38–126)
Anion gap: 14 (ref 5–15)
BUN: 16 mg/dL (ref 6–20)
CO2: 24 mmol/L (ref 22–32)
Calcium: 9.5 mg/dL (ref 8.9–10.3)
Chloride: 102 mmol/L (ref 98–111)
Creatinine, Ser: 0.65 mg/dL (ref 0.44–1.00)
GFR, Estimated: >60 mL/min (ref >60)
Glucose, Bld: 90 mg/dL (ref 70–99)
Potassium: 3.6 mmol/L (ref 3.5–5.1)
Sodium: 140 mmol/L (ref 135–145)
Total Bilirubin: 0.8 mg/dL (ref 0.3–1.2)
Total Protein: 8.9 g/dL — ABNORMAL HIGH (ref 6.5–8.1)

## 2021-12-16 LAB — TSH: TSH: 0.211 u[IU]/mL — ABNORMAL LOW (ref 0.350–4.500)

## 2021-12-16 LAB — D-DIMER, QUANTITATIVE: D-Dimer, Quant: <0.27 ug/mL-FEU (ref 0.00–0.50)

## 2021-12-16 LAB — BRAIN NATRIURETIC PEPTIDE: B Natriuretic Peptide: 7 pg/mL (ref 0.0–100.0)

## 2021-12-16 LAB — RESP PANEL BY RT-PCR (FLU A&B, COVID) ARPGX2
Influenza A by PCR: NEGATIVE
Influenza B by PCR: NEGATIVE
SARS Coronavirus 2 by RT PCR: NEGATIVE

## 2021-12-16 MED ORDER — SODIUM CHLORIDE 0.9 % IV BOLUS
1000.0000 mL | Freq: Once | INTRAVENOUS | Status: AC
  Administered 2021-12-16: 1000 mL via INTRAVENOUS

## 2021-12-16 MED ORDER — PREDNISONE 10 MG PO TABS
40.0000 mg | ORAL_TABLET | Freq: Every day | ORAL | 0 refills | Status: AC
Start: 2021-12-16 — End: 2021-12-21

## 2021-12-16 MED ORDER — ALBUTEROL SULFATE HFA 108 (90 BASE) MCG/ACT IN AERS
2.0000 | INHALATION_SPRAY | Freq: Once | RESPIRATORY_TRACT | Status: AC
  Administered 2021-12-16: 2 via RESPIRATORY_TRACT
  Filled 2021-12-16: qty 6.7

## 2021-12-16 MED ORDER — NOREPINEPHRINE 4 MG/250ML-% IV SOLN
INTRAVENOUS | Status: AC
  Filled 2021-12-16: qty 250

## 2021-12-16 NOTE — Discharge Instructions (Signed)
Use the albuterol inhaler every 4 hours while awake for the next 2 days.  After this, use as needed for chest tightness, wheezing, shortness of breath. Take prednisone as prescribed.  Take the entire course, if symptoms improve. Your TSH level was low today, indicating you have hyperthyroid.  Follow-up with your primary care doctor for further evaluation and management. Make sure you are staying well-hydrated with water. Return to the emergency room if you develop difficulty breathing, severe chest pain, or any new, worsening, or concerning symptoms

## 2021-12-16 NOTE — ED Triage Notes (Signed)
Complaints of shortness of breath and palpitations that started at 0600 this morning.

## 2021-12-16 NOTE — ED Provider Notes (Signed)
[facility] EMERGENCY DEPARTMENT Provider Note   CSN: [id number] Arrival date & time: 12/16/21  0815     History  Chief Complaint  Patient presents with   Shortness of Breath    Molly Burton is a 26 y.o. female presenting for evaluation of shortness of breath and palpitations.  Patient states she was awoken from sleep around 6:00 this morning feel like she could not catch her breath.  She feels like her heart has been racing since.  Symptoms have not been improving or worsening since 6, but have been persistent.  She never had symptoms like this before.  No fever.  No recent cough.  No chest pain.  She has some mild nausea currently, no vomiting, abdominal pain, urinary symptoms.  No history of asthma or COPD, has never needed an inhaler.  She does not smoke cigarettes.  HPI     Home Medications Prior to Admission medications   Medication Sig Start Date End Date Taking? Authorizing Provider  NIFEdipine (PROCARDIA XL/NIFEDICAL-XL) 90 MG 24 hr tablet Take 1 tablet (90 mg total) by mouth daily. 07/23/21  Yes Heather Roberts, NP  predniSONE (DELTASONE) 10 MG tablet Take 4 tablets (40 mg total) by mouth daily for 5 days. 12/16/21 12/21/21 Yes Kebra Lowrimore, PA-C  EPINEPHrine 0.3 mg/0.3 mL IJ SOAJ injection Inject 0.3 mg into the muscle as needed for anaphylaxis. 07/23/21   [provider]      Allergies    Patient has no known allergies.    Review of Systems   Review of Systems  Respiratory:  Positive for shortness of breath.   Cardiovascular:  Positive for palpitations.  All other systems reviewed and are negative.  Physical Exam Updated Vital Signs BP (!) 140/107    Pulse 100    Temp 98.7 F (37.1 C) (Oral)    Resp (!) 22    Ht 5\' 2"  (1.575 m)    Wt 60.8 kg    SpO2 97%    Breastfeeding Yes    BMI 24.51 kg/m  Physical Exam Vitals and nursing note reviewed.  Constitutional:      General: She is not in acute distress.    Appearance: Normal appearance.      Comments: Nontoxic  HENT:     Head: Normocephalic and atraumatic.  Eyes:     Conjunctiva/sclera: Conjunctivae normal.     Pupils: Pupils are equal, round, and reactive to light.  Cardiovascular:     Rate and Rhythm: Regular rhythm. Tachycardia present.     Pulses: Normal pulses.     Comments: Tachycardic between 110 and 120 on exam Pulmonary:     Effort: Pulmonary effort is normal. No respiratory distress.     Breath sounds: Wheezing present.     Comments: Inspiratory and expiratory wheezing in all fields.  Mild tachypnea in the low 20s.  Sats in the low 90s on room air. Abdominal:     General: There is no distension.     Palpations: Abdomen is soft. There is no mass.     Tenderness: There is no abdominal tenderness. There is no guarding or rebound.  Musculoskeletal:        General: Normal range of motion.     Cervical back: Normal range of motion and neck supple.     Right lower leg: No edema.     Left lower leg: No edema.  Skin:    General: Skin is warm and dry.     Capillary Refill: Capillary  refill takes less than 2 seconds.  Neurological:     Mental Status: She is alert and oriented to person, place, and time.  Psychiatric:        Mood and Affect: Mood and affect normal.        Speech: Speech normal.        Behavior: Behavior normal.    ED Results / Procedures / Treatments   Labs (all labs ordered are listed, but only abnormal results are displayed) Labs Reviewed  CBC WITH DIFFERENTIAL/PLATELET - Abnormal; Notable for the following components:      Result Value   WBC 11.8 (*)    RBC 5.44 (*)    Hemoglobin 15.4 (*)    HCT 47.3 (*)    Neutro Abs 10.5 (*)    All other components within normal limits  COMPREHENSIVE METABOLIC PANEL - Abnormal; Notable for the following components:   Total Protein 8.9 (*)    Albumin 5.2 (*)    All other components within normal limits  TSH - Abnormal; Notable for the following components:   TSH 0.211 (*)    All other components  within normal limits  RESP PANEL BY RT-PCR (FLU A&B, COVID) [id number]  D-DIMER, QUANTITATIVE  BRAIN NATRIURETIC PEPTIDE    EKG EKG Interpretation  Date/Time:  Thursday December 16 2021 09:03:28 EST Ventricular Rate:  110 PR Interval:  152 QRS Duration: 82 QT Interval:  309 QTC Calculation: 418 R Axis:   41 Text Interpretation: Sinus tachycardia Biatrial enlargement Baseline wander in lead(s) II III aVF Since last tracing rate faster Confirmed by Linwood DibblesKnapp, Jon [phone]([id number]) on 12/16/2021 9:12:50 AM  Radiology DG Chest 2 View  Result Date: 12/16/2021 CLINICAL DATA:  Palpitations and shortness of breath. EXAM: CHEST - 2 VIEW COMPARISON:  Chest x-ray dated March 10, 2016. FINDINGS: The heart size and mediastinal contours are within normal limits. Both lungs are clear. The visualized skeletal structures are unremarkable. IMPRESSION: No active cardiopulmonary disease. Electronically Signed   By: Obie DredgeWilliam T Derry M.D.   On: 12/16/2021 09:29    Procedures Procedures    Medications Ordered in ED Medications  albuterol (VENTOLIN HFA) 108 (90 Base) MCG/ACT inhaler 2 puff (2 puffs Inhalation Given 12/16/21 0944)  sodium chloride 0.9 % bolus 1,000 mL (1,000 mLs Intravenous New Bag/Given 12/16/21 1143)    ED Course/ Medical Decision Making/ A&P                           Medical Decision Making Amount and/or Complexity of Data Reviewed Labs: ordered. Radiology: ordered.  Risk Prescription drug management.    This patient presents to the ED for concern of shortness of breath and palpitations.  This involves an extensive number of treatment options, and is a complaint that carries with it a high risk of complications and morbidity.  The differential diagnosis includes viral illness, bacterial illness, asthma, PE, electrolyte abnormality, thyroid condition   Co morbidities: Diagnosis of thyroid condition, not on medication as TSH is well controlled.  Postpartum hypertension (pregnancy in April  2022)   Additional history: I reviewed OB/GYN and PCP records.  Patient has had some issues controlling her blood pressure since her recent pregnancy.  Currently on nifedipine, dose was increased a few days ago  Lab Tests:  I ordered, and personally interpreted labs.  The pertinent results include: Mild, nonspecific leukocytosis of 11.8.  Electrolytes stable.  Hemoglobin normal.  Dimer negative.  COVID and flu negative.  Of note,  TSH is low, indicating hyperthyroidism, which patient has a history.   Imaging Studies:  I ordered imaging studies including chest x-ray. I independently visualized and interpreted imaging which showed no pneumonia, pneumothorax, effusion I agree with the radiologist interpretation   Cardiac Monitoring:  The patient was maintained on a cardiac monitor.  I personally viewed and interpreted the cardiac monitored which showed an underlying rhythm of: sinus tach   Medicines ordered:  I ordered medication including albuterol for wheezing Reevaluation of the patient after these medicines showed that the patient improved.  Repeat pulmonary exam showed no further wheezing.  Patient no longer tachypneic and SpO2 has improved.  Additionally, patient given fluid bolus with improvement of heart rate/tachycardia  Test Considered:  As dimer is negative and symptoms improved significantly with albuterol, doubt PE and do not feel CTA of the chest is necessary at this time  Disposition:  After consideration of the diagnostic results and the patients response to treatment, I feel that the patent would benefit from outpatient management of wheezing and close PCP follow-up.  Discussed findings with patient, including low thyroid levels and importance of close follow-up with PCP for management of hyperthyroidism.  Discussed use of albuterol inhaler and prednisone.  At this time, patient appears safe for discharge.  Return precautions given.  Patient states she understands and  agrees to plan.    Final Clinical Impression(s) / ED Diagnoses Final diagnoses:  Wheezing  Low TSH level  Palpitations    Rx / DC Orders ED Discharge Orders          Ordered    predniSONE (DELTASONE) 10 MG tablet  Daily        12/16/21 1332              Graviela Nodal, PA-C 12/16/21 1402    Linwood Dibbles, MD 12/17/21 [phone]

## 2021-12-16 NOTE — Telephone Encounter (Signed)
Pt called In regard to thyroid   Pt is currently at [facility] they did blood work and is having issues with thyroids   Pt wants to know if she can get a prescription sent in in regard   Can call pt to discuss

## 2021-12-16 NOTE — Telephone Encounter (Signed)
Spoke with patient and advised she has never taken a thyroid medication referral sent to Endo

## 2021-12-16 NOTE — Telephone Encounter (Signed)
Patient called need thyroid medicine called in her pharmacy [facility], feels awful and needs this medicine.

## 2021-12-16 NOTE — Telephone Encounter (Signed)
Spoke with patient. She stated that her thyroid was checked at ED and stated they said she needs to be seen by primary. Was just here a couple of days ago.   Preferred pharmacy: [facility]

## 2021-12-16 NOTE — Telephone Encounter (Signed)
Patient aware.

## 2021-12-16 NOTE — ED Notes (Signed)
Bag lunch given to pt per request.

## 2021-12-17 ENCOUNTER — Telehealth: Payer: Self-pay

## 2021-12-17 NOTE — Telephone Encounter (Signed)
Transition Care Management Follow-up Telephone Call Date of discharge and from where: 12/16/2021 from [facility] How have you been since you were released from the hospital? Pt stated that her breathing is better. She is still having the light headed feeling. Pt did not have any other questions or concerns at this time.  Any questions or concerns? No  Items Reviewed: Did the pt receive and understand the discharge instructions provided? Yes  Medications obtained and verified? Yes  Other? No  Any new allergies since your discharge? No  Dietary orders reviewed? No Do you have support at home? Yes   Functional Questionnaire: (I = Independent and D = Dependent) ADLs: I  Bathing/Dressing- I  Meal Prep- I  Eating- I  Maintaining continence- I  Transferring/Ambulation- I  Managing Meds- I   Follow up appointments reviewed:  PCP Hospital f/u appt confirmed? No   Specialist Hospital f/u appt confirmed? Yes  Scheduled to see Ronny Bacon, NP on 12/29/2021 @ 1:30pm. Are transportation arrangements needed? No  If their condition worsens, is the pt aware to call PCP or go to the Emergency Dept.? Yes Was the patient provided with contact information for the PCP's office or ED? Yes Was to pt encouraged to call back with questions or concerns? Yes

## 2021-12-24 DIAGNOSIS — E663 Overweight: Secondary | ICD-10-CM | POA: Diagnosis not present

## 2021-12-24 DIAGNOSIS — Z1331 Encounter for screening for depression: Secondary | ICD-10-CM | POA: Diagnosis not present

## 2021-12-24 DIAGNOSIS — E0590 Thyrotoxicosis, unspecified without thyrotoxic crisis or storm: Secondary | ICD-10-CM | POA: Diagnosis not present

## 2021-12-24 DIAGNOSIS — Z6825 Body mass index (BMI) 25.0-25.9, adult: Secondary | ICD-10-CM | POA: Diagnosis not present

## 2021-12-29 ENCOUNTER — Ambulatory Visit (INDEPENDENT_AMBULATORY_CARE_PROVIDER_SITE_OTHER): Payer: No Typology Code available for payment source | Admitting: Nurse Practitioner

## 2021-12-29 ENCOUNTER — Encounter: Payer: Self-pay | Admitting: Nurse Practitioner

## 2021-12-29 VITALS — BP 117/80 | HR 83 | Ht 62.0 in | Wt 136.2 lb

## 2021-12-29 DIAGNOSIS — E0590 Thyrotoxicosis, unspecified without thyrotoxic crisis or storm: Secondary | ICD-10-CM | POA: Diagnosis not present

## 2021-12-29 NOTE — Progress Notes (Signed)
12/29/2021     Endocrinology Consult Note    Subjective:    Patient ID: Molly Burton, female    DOB: 07/28/1996, PCP [facility], Dewaine Conger, FNP.   Past Medical History:  Diagnosis Date   Hypertension     Past Surgical History:  Procedure Laterality Date   CESAREAN SECTION N/A 03/12/2019   Procedure: CESAREAN SECTION;  Surgeon: Truett Mainland, DO;  Location: [facility] LD ORS;  Service: Obstetrics;  Laterality: N/A;   CESAREAN SECTION MULTI-GESTATIONAL N/A 03/08/2021   Procedure: CESAREAN SECTION MULTI-GESTATIONAL;  Surgeon: Florian Buff, MD;  Location: [facility] LD ORS;  Service: Obstetrics;  Laterality: N/A;   TONSILLECTOMY      Social History   Socioeconomic History   Marital status: Single    Spouse name: Not on file   Number of children: 3   Years of education: N/A   Highest education level: Bachelor's degree (e.g., BA, AB, BS)  Occupational History   Occupation: [employer]    Comment: Insurance- Group coverage- underwriting  Tobacco Use   Smoking status: Never   Smokeless tobacco: Never  Vaping Use   Vaping Use: Never used  Substance and Sexual Activity   Alcohol use: Not Currently    Comment: Pt did have wine on 08/05/2018   Drug use: Not Currently    Types: Marijuana    Comment: not since 07/03/18   Sexual activity: Not Currently    Birth control/protection: None  Other Topics Concern   Not on file  Social History Narrative   Not on file   Social Determinants of Health   Financial Resource Strain: Low Risk    Difficulty of Paying Living Expenses: Not hard at all  Food Insecurity: No Food Insecurity   Worried About Charity fundraiser in the Last Year: Never true   [place] in the Last Year: Never true  Transportation Needs: No Transportation Needs   Lack of Transportation (Medical): No   Lack of Transportation (Non-Medical): No  Physical Activity: Inactive   Days of Exercise per Week: 0 days   Minutes of Exercise per Session: 0  min  Stress: No Stress Concern Present   Feeling of Stress : Not at all  Social Connections: Moderately Isolated   Frequency of Communication with Friends and Family: More than three times a week   Frequency of Social Gatherings with Friends and Family: Once a week   Attends Religious Services: 1 to 4 times per year   Active Member of [employer] or Organizations: No   Attends Archivist Meetings: Never   Marital Status: Never married    Family History  Problem Relation Age of Onset   Hyperlipidemia Mother    Hypertension Father    Thyroid disease Maternal Grandmother    Thyroid disease Paternal Grandmother     Outpatient Encounter Medications as of 12/29/2021  Medication Sig   amoxicillin-clavulanate (AUGMENTIN) 875-125 MG tablet Take 1 tablet by mouth 2 (two) times daily.   EPINEPHrine 0.3 mg/0.3 mL IJ SOAJ injection Inject 0.3 mg into the muscle as needed for anaphylaxis.   NIFEdipine (PROCARDIA XL/NIFEDICAL-XL) 90 MG 24 hr tablet Take 1 tablet (90 mg total) by mouth daily.   No facility-administered encounter medications on file as of 12/29/2021.    ALLERGIES: No Known Allergies  VACCINATION STATUS: Immunization History  Administered Date(s) Administered   Influenza,inj,Quad PF,6+ Mos 08/29/2018   Moderna Sars-Covid-2 Vaccination 02/07/2020, 03/04/2020   PPD Test 07/27/2020  Tdap 12/31/2018     HPI  Molly Burton is 26 y.o. female who presents today with a medical history as above. she is being seen in consultation for hyperthyroidism requested by Renee Rival, FNP.  she has been dealing with symptoms of fatigue, palpitations, unintentional weight loss, anxiety, and insomnia intermittently dating back to 2019 but progressively getting worse since over the last 2 months.  her most recent thyroid labs revealed mildly suppressed TSH of 0.211 on 12/16/21.  Chart review shows intermittent suppression of TSH dating back as early as 2019.  It should be noted that  she is currently breastfeeding twins.  she denies dysphagia, choking, shortness of breath, no recent voice change.    she does have family history of thyroid dysfunction in her grandparents (maternal has hyper, paternal has hypo), but denies family hx of thyroid cancer. she denies personal history of goiter. she is not on any anti-thyroid medications nor on any thyroid hormone supplements. Denies use of Biotin containing supplements.  she is willing to proceed with appropriate work up and therapy for thyrotoxicosis.   Review of systems  Constitutional: + steadily decreasing body weight-per her report, current Body mass index is 24.91 kg/m., + fatigue, no subjective hyperthermia, no subjective hypothermia Eyes: no blurry vision, no xerophthalmia ENT: no sore throat, no nodules palpated in throat, no dysphagia/odynophagia, no hoarseness Cardiovascular: no chest pain, no shortness of breath, + intermittent palpitations, no leg swelling Respiratory: no cough, no shortness of breath Gastrointestinal: no nausea/vomiting/diarrhea Musculoskeletal: no muscle/joint aches Skin: no rashes, no hyperemia Neurological: no tremors, no numbness, no tingling, no dizziness Psychiatric: no depression, + anxiety, + insomnia   Objective:    BP 117/80    Pulse 83    Ht 5\' 2"  (1.575 m)    Wt 136 lb 3.2 oz (61.8 kg)    SpO2 96%    Breastfeeding Yes    BMI 24.91 kg/m   Wt Readings from Last 3 Encounters:  12/29/21 136 lb 3.2 oz (61.8 kg)  12/16/21 134 lb (60.8 kg)  12/13/21 134 lb (60.8 kg)     BP Readings from Last 3 Encounters:  12/29/21 117/80  12/16/21 (!) 139/95  12/13/21 127/86                          Physical Exam- Limited  Constitutional:  Body mass index is 24.91 kg/m. , not in acute distress, normal state of mind Eyes:  EOMI, no exophthalmos Neck: Supple Thyroid: mild goiter, no palpable nodularity Cardiovascular: RRR, no murmurs, rubs, or gallops, no edema Respiratory: Adequate  breathing efforts, no crackles, rales, rhonchi, or wheezing Musculoskeletal: no gross deformities, strength intact in all four extremities, no gross restriction of joint movements Skin:  no rashes, no hyperemia Neurological: no tremor with outstretched hands   CMP     Component Value Date/Time   NA 140 12/16/2021 0937   NA 143 12/13/2021 0912   K 3.6 12/16/2021 0937   CL 102 12/16/2021 0937   CO2 24 12/16/2021 0937   GLUCOSE 90 12/16/2021 0937   BUN 16 12/16/2021 0937   BUN 14 12/13/2021 0912   CREATININE 0.65 12/16/2021 0937   CALCIUM 9.5 12/16/2021 0937   PROT 8.9 (H) 12/16/2021 0937   PROT 7.8 12/13/2021 0912   ALBUMIN 5.2 (H) 12/16/2021 0937   ALBUMIN 4.9 12/13/2021 0912   AST 19 12/16/2021 0937   ALT 17 12/16/2021 0937   ALKPHOS 123 12/16/2021 [id number]  BILITOT 0.8 12/16/2021 0937   BILITOT 0.4 12/13/2021 0912   GFRNONAA >60 12/16/2021 0937   GFRAA 146 01/12/2021 0814     CBC    Component Value Date/Time   WBC 11.8 (H) 12/16/2021 0937   RBC 5.44 (H) 12/16/2021 0937   HGB 15.4 (H) 12/16/2021 0937   HGB 13.0 07/26/2021 1041   HCT 47.3 (H) 12/16/2021 0937   HCT 38.5 07/26/2021 1041   PLT 208 12/16/2021 0937   PLT 267 07/26/2021 1041   MCV 86.9 12/16/2021 0937   MCV 84 07/26/2021 1041   MCH 28.3 12/16/2021 0937   MCHC 32.6 12/16/2021 0937   RDW 12.8 12/16/2021 0937   RDW 12.7 07/26/2021 1041   LYMPHSABS 0.8 12/16/2021 0937   LYMPHSABS 1.6 07/26/2021 1041   MONOABS 0.3 12/16/2021 0937   EOSABS 0.1 12/16/2021 0937   EOSABS 0.2 07/26/2021 1041   BASOSABS 0.0 12/16/2021 0937   BASOSABS 0.1 07/26/2021 1041     Diabetic Labs (most recent): No results found for: HGBA1C  Lipid Panel     Component Value Date/Time   CHOL 191 07/26/2021 1041   TRIG 60 07/26/2021 1041   HDL 60 07/26/2021 1041   LDLCALC 120 (H) 07/26/2021 1041   LABVLDL 11 07/26/2021 1041     Lab Results  Component Value Date   TSH 0.211 (L) 12/16/2021   TSH 0.574 07/26/2021   TSH 0.402  (L) 01/12/2021   TSH <0.005 (L) 09/16/2020   TSH 0.109 (L) 08/01/2018   TSH 2.14 02/08/2018   TSH 0.184 (L) 11/30/2017   FREET4 1.31 07/26/2021   FREET4 1.12 01/12/2021   FREET4 1.45 09/29/2020   FREET4 1.32 09/16/2020   FREET4 1.41 08/01/2018   FREET4 0.84 02/08/2018        Assessment & Plan:   1. Hyperthyroidism  she is being seen at a kind request of [facility], Dewaine Conger, FNP.  her history and most recent labs are reviewed, and she was examined clinically. Subjective and objective findings are inconsistent with thyrotoxicosis so more information will need to be gathered to identify her dysfunction. The potential risks of untreated thyrotoxicosis and the need for definitive therapy have been discussed in detail with her, and she agrees to proceed with diagnostic workup and treatment plan.   I will repeat full profile thyroid function tests today, including antibody testing to assess for autoimmune thyroid dysfunction.  I did not order uptake and scan given only minimal suppression of TSH and her breastfeeding status.  I did order thyroid ultrasound to assess baseline anatomy given her mild goiter on exam.    she will return in 3 weeks to discuss test results and treatment plan.   I did not initiate any new prescriptions at today's visit.   -Patient is advised to maintain close follow up with Renee Rival, FNP for primary care needs.   - Time spent with the patient: 60 minutes, of which >50% was spent in obtaining information about her symptoms, reviewing her previous labs, evaluations, and treatments, counseling her about her hyperthyroidism , and developing a plan to confirm the diagnosis and long term treatment as necessary. Please refer to "Patient Self Inventory" in the Media tab for reviewed elements of pertinent patient history.  [place] participated in the discussions, expressed understanding, and voiced agreement with the above plans.  All questions were  answered to her satisfaction. she is encouraged to contact clinic should she have any questions or concerns prior to her return visit.  Follow up plan: Return in about 3 weeks (around 01/19/2022) for Thyroid follow up, Previsit labs, thyroid ultrasound.   Thank you for involving me in the care of this pleasant patient, and I will continue to update you with her progress.    Rayetta Pigg, [facility] [address] [place], [place] [address] Phone: [phone] Fax: [phone]  12/29/2021, 2:23 PM

## 2021-12-29 NOTE — Patient Instructions (Signed)
Hyperthyroidism  Hyperthyroidism is when the thyroid gland is too active (overactive). The thyroid gland is a small gland located in the lower front part of the neck, just in front of the windpipe (trachea). This gland makes hormones that help control how the body uses food for energy (metabolism) as well as how the heart and brain function. These hormones also play a role in keeping your bones strong. When the thyroid is overactive, it produces toomuch of a hormone called thyroxine. What are the causes? This condition may be caused by: Graves' disease. This is a disorder in which the body's disease-fighting system (immune system) attacks the thyroid gland. This is the most common cause. Inflammation of the thyroid gland. A tumor in the thyroid gland. Use of certain medicines, including: Prescription thyroid hormone replacement. Herbal supplements that mimic thyroid hormones. Amiodarone therapy. Solid or fluid-filled lumps within your thyroid gland (thyroid nodules). Taking in a large amount of iodine from foods or medicines. What increases the risk? You are more likely to develop this condition if: You are female. You have a family history of thyroid conditions. You smoke tobacco. You use a medicine called lithium. You take medicines that affect the immune system (immunosuppressants). What are the signs or symptoms? Symptoms of this condition include: Nervousness. Inability to tolerate heat. Unexplained weight loss. Diarrhea. Change in the texture of hair or skin. Heart skipping beats or making extra beats. Rapid heart rate. Loss of menstruation. Shaky hands. Fatigue. Restlessness. Sleep problems. Enlarged thyroid gland or a lump in the thyroid (nodule). You may also have symptoms of Graves' disease, which may include: Protruding eyes. Dry eyes. Red or swollen eyes. Problems with vision. How is this diagnosed? This condition may be diagnosed based on: Your symptoms and  medical history. A physical exam. Blood tests. Thyroid ultrasound. This test involves using sound waves to produce images of the thyroid gland. A thyroid scan. A radioactive substance is injected into a vein, and images show how much iodine is present in the thyroid. Radioactive iodine uptake test (RAIU). A small amount of radioactive iodine is given by mouth to see how much iodine the thyroid absorbs after a certain amount of time. How is this treated? Treatment depends on the cause and severity of the condition. Treatment may include: Medicines to reduce the amount of thyroid hormone your body makes. Radioactive iodine treatment (radioiodine therapy). This involves swallowing a small dose of radioactive iodine, in capsule or liquid form, to kill thyroid cells. Surgery to remove part or all of your thyroid gland. You may need to take thyroid hormone replacement medicine for the rest of your life after thyroid surgery. Medicines to help manage your symptoms. Follow these instructions at home:  Take over-the-counter and prescription medicines only as told by your health care provider. Do not use any products that contain nicotine or tobacco, such as cigarettes and e-cigarettes. If you need help quitting, ask your health care provider. Follow any instructions from your health care provider about diet. You may be instructed to limit foods that contain iodine. Keep all follow-up visits as told by your health care provider. This is important. You will need to have blood tests regularly so that your health care provider can monitor your condition. Contact a health care provider if: Your symptoms do not get better with treatment. You have a fever. You are taking thyroid hormone replacement medicine and you: Have symptoms of depression. Feel like you are tired all the time. Gain weight. Get help right   away if: You have chest pain. You have decreased alertness or a change in your awareness. You  have abdominal pain. You feel dizzy. You have a rapid heartbeat. You have an irregular heartbeat. You have difficulty breathing. Summary The thyroid gland is a small gland located in the lower front part of the neck, just in front of the windpipe (trachea). Hyperthyroidism is when the thyroid gland is too active (overactive) and produces too much of a hormone called thyroxine. The most common cause is Graves' disease, a disorder in which your immune system attacks the thyroid gland. Hyperthyroidism can cause various symptoms, such as unexplained weight loss, nervousness, inability to tolerate heat, or changes in your heartbeat. Treatment may include medicine to reduce the amount of thyroid hormone your body makes, radioiodine therapy, surgery, or medicines to manage symptoms. This information is not intended to replace advice given to you by your health care provider. Make sure you discuss any questions you have with your healthcare provider. Document Revised: 07/30/2020 Document Reviewed: 07/30/2020 Elsevier Patient Education  2022 Elsevier Inc.  

## 2021-12-31 LAB — TSH: TSH: 0.913 u[IU]/mL (ref 0.450–4.500)

## 2021-12-31 LAB — T4, FREE: Free T4: 1.25 ng/dL (ref 0.82–1.77)

## 2021-12-31 LAB — THYROID PEROXIDASE ANTIBODY: Thyroperoxidase Ab SerPl-aCnc: 18 IU/mL (ref 0–34)

## 2021-12-31 LAB — THYROGLOBULIN ANTIBODY: Thyroglobulin Antibody: <1 IU/mL (ref 0.0–0.9)

## 2021-12-31 LAB — T3, FREE: T3, Free: 3.4 pg/mL (ref 2.0–4.4)

## 2022-01-07 ENCOUNTER — Ambulatory Visit (HOSPITAL_COMMUNITY)
Admission: RE | Admit: 2022-01-07 | Discharge: 2022-01-07 | Disposition: A | Discharge status: Home / Self Care | Payer: No Typology Code available for payment source | Source: Ambulatory Visit | Attending: Nurse Practitioner | Admitting: Nurse Practitioner

## 2022-01-07 ENCOUNTER — Other Ambulatory Visit: Payer: Self-pay

## 2022-01-07 DIAGNOSIS — E0590 Thyrotoxicosis, unspecified without thyrotoxic crisis or storm: Secondary | ICD-10-CM | POA: Insufficient documentation

## 2022-01-19 ENCOUNTER — Other Ambulatory Visit: Payer: Self-pay

## 2022-01-19 ENCOUNTER — Ambulatory Visit (INDEPENDENT_AMBULATORY_CARE_PROVIDER_SITE_OTHER): Payer: No Typology Code available for payment source | Admitting: Nurse Practitioner

## 2022-01-19 ENCOUNTER — Encounter: Payer: Self-pay | Admitting: Nurse Practitioner

## 2022-01-19 VITALS — BP 136/86 | HR 78 | Ht 62.0 in | Wt 138.0 lb

## 2022-01-19 DIAGNOSIS — R7989 Other specified abnormal findings of blood chemistry: Secondary | ICD-10-CM | POA: Diagnosis not present

## 2022-01-19 NOTE — Progress Notes (Signed)
01/19/2022     Endocrinology Follow Up Note    Subjective:    Patient ID: Molly Burton, female    DOB: 18-May-1996, PCP Paseda, Baird Kay, FNP.   Past Medical History:  Diagnosis Date   Hypertension     Past Surgical History:  Procedure Laterality Date   CESAREAN SECTION N/A 03/12/2019   Procedure: CESAREAN SECTION;  Surgeon: Levie Heritage, DO;  Location: [facility];  Service: Obstetrics;  Laterality: N/A;   CESAREAN SECTION MULTI-GESTATIONAL N/A 03/08/2021   Procedure: CESAREAN SECTION MULTI-GESTATIONAL;  Surgeon: Lazaro Arms, MD;  Location: [facility];  Service: Obstetrics;  Laterality: N/A;   TONSILLECTOMY      Social History   Socioeconomic History   Marital status: Single    Spouse name: Not on file   Number of children: 3   Years of education: N/A   Highest education level: Bachelor's degree (e.g., BA, AB, BS)  Occupational History   Occupation: [employer]    Comment: Insurance- Group coverage- underwriting  Tobacco Use   Smoking status: Never   Smokeless tobacco: Never  Vaping Use   Vaping Use: Never used  Substance and Sexual Activity   Alcohol use: Not Currently    Comment: Pt did have wine on 08/05/2018   Drug use: Not Currently    Types: Marijuana    Comment: not since 07/03/18   Sexual activity: Not Currently    Birth control/protection: None  Other Topics Concern   Not on file  Social History Narrative   Not on file   Social Determinants of Health   Financial Resource Strain: Low Risk    Difficulty of Paying Living Expenses: Not hard at all  Food Insecurity: No Food Insecurity   Worried About Programme researcher, broadcasting/film/video in the Last Year: Never true   Ran Out of Food in the Last Year: Never true  Transportation Needs: No Transportation Needs   Lack of Transportation (Medical): No   Lack of Transportation (Non-Medical): No  Physical Activity: Inactive   Days of Exercise per Week: 0 days   Minutes of Exercise per Session:  0 min  Stress: No Stress Concern Present   Feeling of Stress : Not at all  Social Connections: Moderately Isolated   Frequency of Communication with Friends and Family: More than three times a week   Frequency of Social Gatherings with Friends and Family: Once a week   Attends Religious Services: 1 to 4 times per year   Active Member of [employer] or Organizations: No   Attends Banker Meetings: Never   Marital Status: Never married    Family History  Problem Relation Age of Onset   Hyperlipidemia Mother    Hypertension Father    Thyroid disease Maternal Grandmother    Thyroid disease Paternal Grandmother     Outpatient Encounter Medications as of 01/19/2022  Medication Sig   EPINEPHrine 0.3 mg/0.3 mL IJ SOAJ injection Inject 0.3 mg into the muscle as needed for anaphylaxis.   NIFEdipine (PROCARDIA XL/NIFEDICAL-XL) 90 MG 24 hr tablet Take 1 tablet (90 mg total) by mouth daily.   [DISCONTINUED] amoxicillin-clavulanate (AUGMENTIN) 875-125 MG tablet Take 1 tablet by mouth 2 (two) times daily.   No facility-administered encounter medications on file as of 01/19/2022.    ALLERGIES: No Known Allergies  VACCINATION STATUS: Immunization History  Administered Date(s) Administered   Influenza,inj,Quad PF,6+ Mos 08/29/2018   Moderna Sars-Covid-2 Vaccination 02/07/2020, 03/04/2020   PPD  Test 07/27/2020   Tdap 12/31/2018     HPI  Molly Burton is 26 y.o. female who presents today with a medical history as above. she is being seen in follow up after being seen in consultation for hyperthyroidism requested by Donell Beers, FNP.  she has been dealing with symptoms of fatigue, palpitations, unintentional weight loss, anxiety, and insomnia intermittently dating back to 2019 but progressively getting worse since over the last 2 months.  her most recent thyroid labs revealed mildly suppressed TSH of 0.211 on 12/16/21.  Chart review shows intermittent suppression of TSH dating  back as early as 2019.  It should be noted that she is currently breastfeeding twins.  she denies dysphagia, choking, shortness of breath, no recent voice change.    she does have family history of thyroid dysfunction in her grandparents (maternal has hyper, paternal has hypo), but denies family hx of thyroid cancer. she denies personal history of goiter. she is not on any anti-thyroid medications nor on any thyroid hormone supplements. Denies use of Biotin containing supplements.  she is willing to proceed with appropriate work up and therapy for thyrotoxicosis.   Review of systems  Constitutional: + Minimally fluctuating body weight,  current Body mass index is 25.24 kg/m. , + fatigue, no subjective hyperthermia, no subjective hypothermia Eyes: no blurry vision, no xerophthalmia ENT: no sore throat, no nodules palpated in throat, no dysphagia/odynophagia, no hoarseness Cardiovascular: no chest pain, no shortness of breath, no palpitations, no leg swelling Respiratory: no cough, no shortness of breath Gastrointestinal: no nausea/vomiting/diarrhea Musculoskeletal: no muscle/joint aches Skin: no rashes, no hyperemia Neurological: no tremors, no numbness, no tingling, no dizziness Psychiatric: no depression, no anxiety   Objective:    BP 136/86    Pulse 78    Ht 5\' 2"  (1.575 m)    Wt 138 lb (62.6 kg)    BMI 25.24 kg/m   Wt Readings from Last 3 Encounters:  01/19/22 138 lb (62.6 kg)  12/29/21 136 lb 3.2 oz (61.8 kg)  12/16/21 134 lb (60.8 kg)     BP Readings from Last 3 Encounters:  01/19/22 136/86  12/29/21 117/80  12/16/21 (!) 139/95            Physical Exam- Limited  Constitutional:  Body mass index is 25.24 kg/m. , not in acute distress, normal state of mind Eyes:  EOMI, no exophthalmos Neck: Supple Cardiovascular: RRR, no murmurs, rubs, or gallops, no edema Respiratory: Adequate breathing efforts, no crackles, rales, rhonchi, or wheezing Musculoskeletal: no gross  deformities, strength intact in all four extremities, no gross restriction of joint movements Skin:  no rashes, no hyperemia Neurological: no tremor with outstretched hands   CMP     Component Value Date/Time   NA 140 12/16/2021 0937   NA 143 12/13/2021 0912   K 3.6 12/16/2021 0937   CL 102 12/16/2021 0937   CO2 24 12/16/2021 0937   GLUCOSE 90 12/16/2021 0937   BUN 16 12/16/2021 0937   BUN 14 12/13/2021 0912   CREATININE 0.65 12/16/2021 0937   CALCIUM 9.5 12/16/2021 0937   PROT 8.9 (H) 12/16/2021 0937   PROT 7.8 12/13/2021 0912   ALBUMIN 5.2 (H) 12/16/2021 0937   ALBUMIN 4.9 12/13/2021 0912   AST 19 12/16/2021 0937   ALT 17 12/16/2021 0937   ALKPHOS 123 12/16/2021 0937   BILITOT 0.8 12/16/2021 0937   BILITOT 0.4 12/13/2021 0912   GFRNONAA >60 12/16/2021 0937   GFRAA 146 01/12/2021 01/14/2021  CBC    Component Value Date/Time   WBC 11.8 (H) 12/16/2021 0937   RBC 5.44 (H) 12/16/2021 0937   HGB 15.4 (H) 12/16/2021 0937   HGB 13.0 07/26/2021 1041   HCT 47.3 (H) 12/16/2021 0937   HCT 38.5 07/26/2021 1041   PLT 208 12/16/2021 0937   PLT 267 07/26/2021 1041   MCV 86.9 12/16/2021 0937   MCV 84 07/26/2021 1041   MCH 28.3 12/16/2021 0937   MCHC 32.6 12/16/2021 0937   RDW 12.8 12/16/2021 0937   RDW 12.7 07/26/2021 1041   LYMPHSABS 0.8 12/16/2021 0937   LYMPHSABS 1.6 07/26/2021 1041   MONOABS 0.3 12/16/2021 0937   EOSABS 0.1 12/16/2021 0937   EOSABS 0.2 07/26/2021 1041   BASOSABS 0.0 12/16/2021 0937   BASOSABS 0.1 07/26/2021 1041     Diabetic Labs (most recent): No results found for: HGBA1C  Lipid Panel     Component Value Date/Time   CHOL 191 07/26/2021 1041   TRIG 60 07/26/2021 1041   HDL 60 07/26/2021 1041   LDLCALC 120 (H) 07/26/2021 1041   LABVLDL 11 07/26/2021 1041     Lab Results  Component Value Date   TSH 0.913 12/29/2021   TSH 0.211 (L) 12/16/2021   TSH 0.574 07/26/2021   TSH 0.402 (L) 01/12/2021   TSH <0.005 (L) 09/16/2020   TSH 0.109 (L)  08/01/2018   TSH 2.14 02/08/2018   TSH 0.184 (L) 11/30/2017   FREET4 1.25 12/29/2021   FREET4 1.31 07/26/2021   FREET4 1.12 01/12/2021   FREET4 1.45 09/29/2020   FREET4 1.32 09/16/2020   FREET4 1.41 08/01/2018   FREET4 0.84 02/08/2018     Thyroid US from 01/07/22 CLINICAL DATA:  Hyperthyroidism   EXAM: THYROID ULTRASOUND   TECHNIQUE: Ultrasound examination of the thyroid gland and adjacent soft tissues was performed.   COMPARISON:  None.   FINDINGS: Parenchymal Echotexture: Normal   Isthmus: 3 mm   Right lobe: 4.8 x 1.7 x 1.5 cm   Left lobe: 4.6 x 1.9 x 2.0 cm   _________________________________________________________   Estimated total number of nodules >/= 1 cm: 0   Number of spongiform nodules >/=  2 cm not described below (TR1): 0   Number of mixed cystic and solid nodules >/= 1.5 cm not described below (TR2): 0   _________________________________________________________   No discrete nodules are seen within the thyroid gland.   IMPRESSION: Normal thyroid ultrasound for age   The above is in keeping with the ACR TI-RADS recommendations - J Am Coll Radiol 2017;14:587-595.     Electronically Signed   By: Judie Petit.  Shick M.D.   On: 01/07/2022 14:29    Latest Reference Range & Units 12/16/21 09:38 12/29/21 14:02  TSH 0.450 - 4.500 uIU/mL 0.211 (L) 0.913  Triiodothyronine,Free,Serum 2.0 - 4.4 pg/mL  3.4  T4,Free(Direct) 0.82 - 1.77 ng/dL  5.18  Thyroperoxidase Ab SerPl-aCnc 0 - 34 IU/mL  18  Thyroglobulin Antibody 0.0 - 0.9 IU/mL  <1.0  (L): Data is abnormally low  Assessment & Plan:   1. Subclinical hyperthyroidism-resolved  she is being seen at a kind request of Paseda, Baird Kay, FNP.  Her repeat thyroid function tests have returned to normal without any intervention.  Her antibody testing was negative ruling out autoimmune thyroid dysfunction.  Her symptoms have also resolved.  It is possible she had acute thyroiditis which is usually  self-limiting or could be due to hormonal changes postnatally.  She is aware that acute thyroiditis can recur and  is encouraged to reach out if she starts to experience symptoms again.  Her thyroid ultrasound was normal, no nodularity noted.      -Patient is advised to maintain close follow up with Donell BeersPaseda, Folashade R, FNP for primary care needs.   I spent 20 minutes in the care of the patient today including review of labs from Thyroid Function, CMP, and other relevant labs ; imaging/biopsy records (current and previous including abstractions from other facilities); face-to-face time discussing  her lab results and symptoms, medications doses, her options of short and long term treatment based on the latest standards of care / guidelines;   and documenting the encounter.  Molly Burton  participated in the discussions, expressed understanding, and voiced agreement with the above plans.  All questions were answered to her satisfaction. she is encouraged to contact clinic should she have any questions or concerns prior to her return visit.  Follow up plan: Return in about 6 months (around 07/19/2022) for Thyroid follow up, Previsit labs, Virtual visit ok.   Thank you for involving me in the care of this pleasant patient, and I will continue to update you with her progress.   Ronny BaconWhitney Dvaughn Fickle, [facility] Community HospitalFNP-BC [facility] [address] [place], [place] [address] Phone: [phone] Fax: [phone]  01/19/2022, 10:51 AM

## 2022-01-23 IMAGING — DX DG CHEST 2V
2 series · 2 of 2 positions shown · non-contrast
Comparison: Chest x-ray dated March 10, 2016.

CLINICAL DATA: Palpitations and shortness of breath.

EXAM:
CHEST - 2 VIEW

[chest pa]
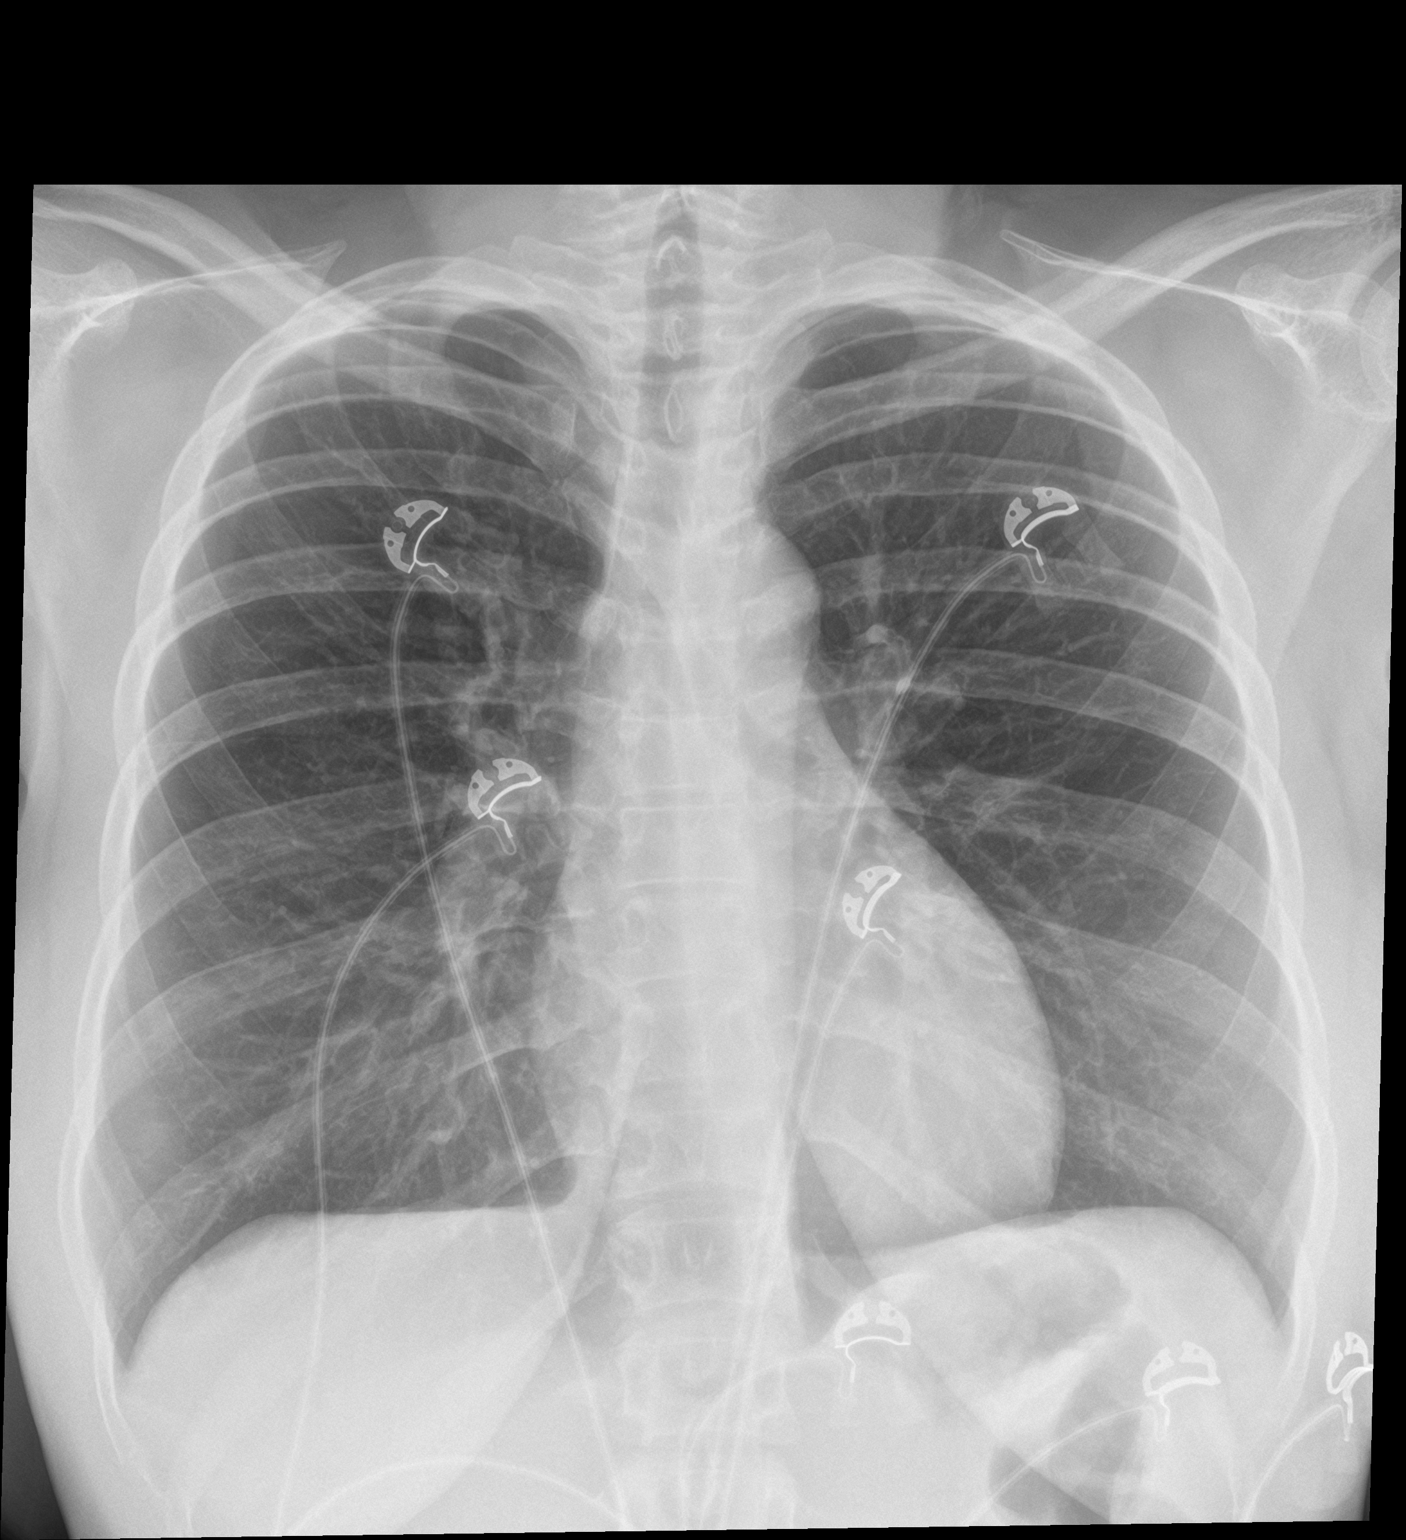

[chest lat]
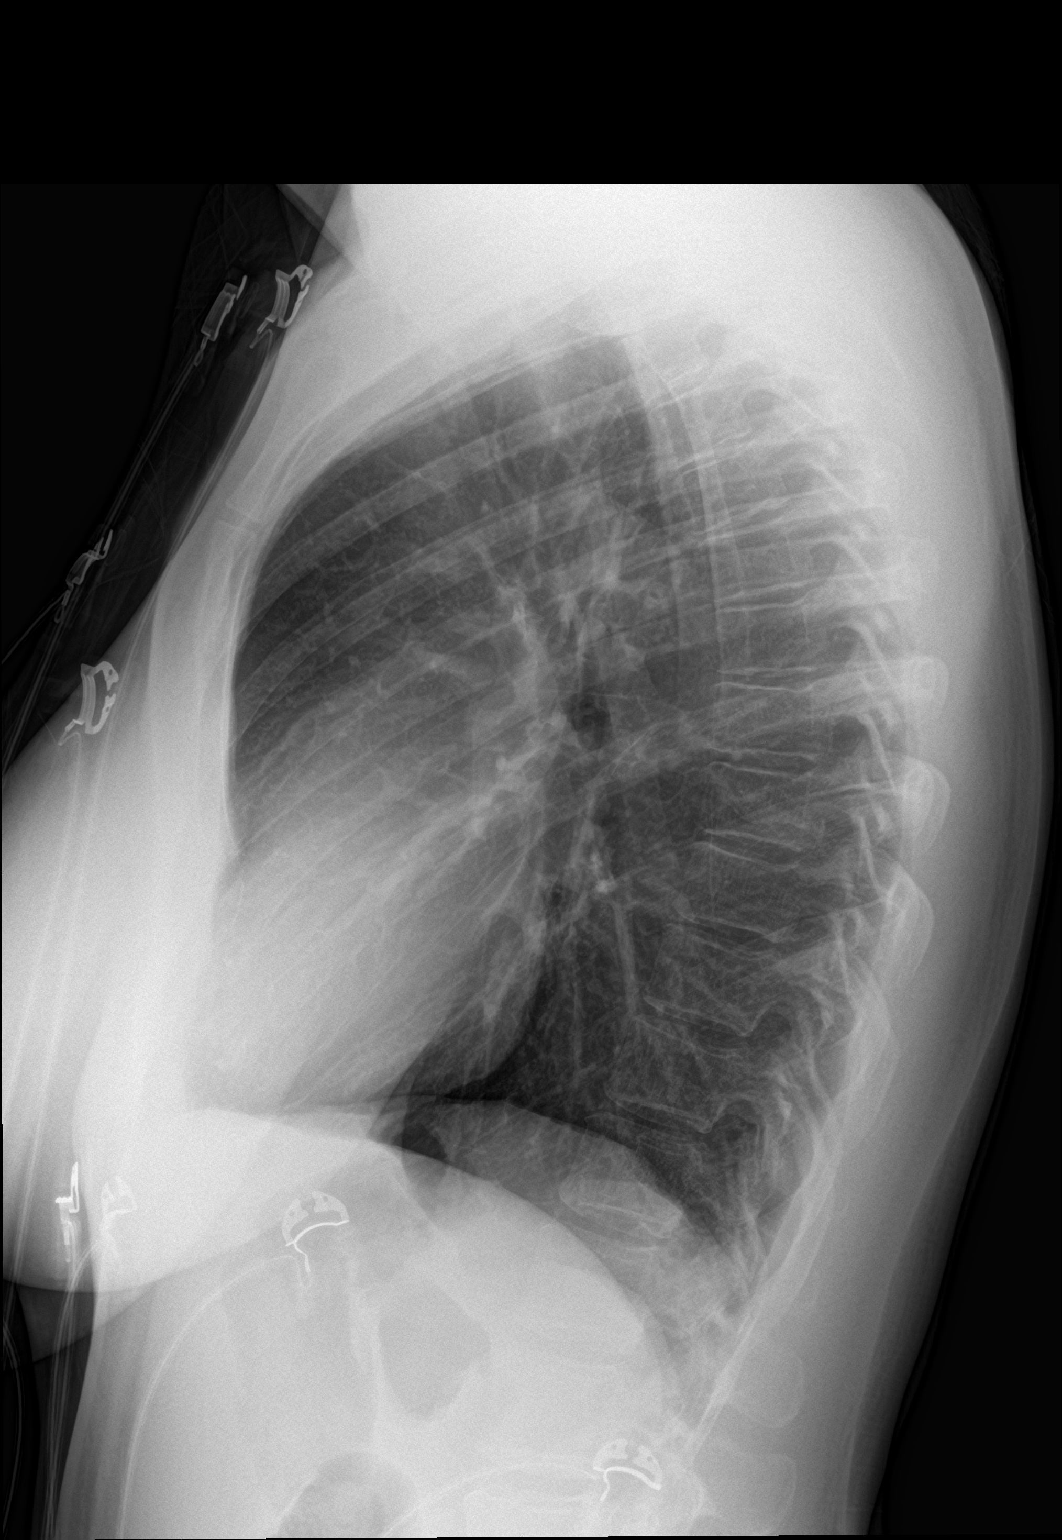

[2 of 2 positions shown; findings below may reference images not displayed]

FINDINGS: The heart size and mediastinal contours are within normal limits.
Both lungs are clear. The visualized skeletal structures are
unremarkable.
IMPRESSION: No active cardiopulmonary disease.

## 2022-02-03 DIAGNOSIS — F53 Postpartum depression: Secondary | ICD-10-CM | POA: Diagnosis not present

## 2022-02-03 DIAGNOSIS — E0590 Thyrotoxicosis, unspecified without thyrotoxic crisis or storm: Secondary | ICD-10-CM | POA: Diagnosis not present

## 2022-03-10 ENCOUNTER — Other Ambulatory Visit: Payer: Self-pay

## 2022-03-10 ENCOUNTER — Ambulatory Visit
Admission: EM | Admit: 2022-03-10 | Discharge: 2022-03-10 | Disposition: A | Discharge status: Home / Self Care | Payer: No Typology Code available for payment source | Attending: Family Medicine | Admitting: Family Medicine

## 2022-03-10 ENCOUNTER — Encounter: Payer: Self-pay | Admitting: Emergency Medicine

## 2022-03-10 DIAGNOSIS — H1033 Unspecified acute conjunctivitis, bilateral: Secondary | ICD-10-CM | POA: Diagnosis not present

## 2022-03-10 MED ORDER — OFLOXACIN 0.3 % OP SOLN: 1.0000 [drp] | Freq: Four times a day (QID) | OPHTHALMIC | 0 refills | Status: DC

## 2022-03-10 NOTE — ED Provider Notes (Signed)
?[facility] ? ? ? ?CSN: [id number] ?Arrival date & time: 03/10/22  8185 ? ? ?  ? ?History   ?Chief Complaint ?Chief Complaint  ?Patient presents with  ? Eye Problem  ? ? ?HPI ?Molly Burton is a 26 y.o. female.  ? ?Presenting today with 4 to 5-day history of bilateral eye itching, drainage, redness.  Denies pain, visual change, injury to the eye or new exposures, headache, nausea, vomiting.  Trying over-the-counter red eyedrops with no relief.  No known sick contacts recently. ? ? ?Past Medical History:  ?Diagnosis Date  ? Hypertension   ? ? ?Patient Active Problem List  ? Diagnosis Date Noted  ? Fatigue 07/23/2021  ? Headache 07/23/2021  ? Encounter for general adult medical examination with abnormal findings 06/21/2021  ? Callus of heel 06/21/2021  ? Postpartum hypertension 03/23/2021  ? Acute blood loss anemia 03/09/2021  ? History of cesarean delivery 09/16/2020  ? H/O pre-eclampsia 09/16/2020  ? Arthralgia   ? Hyperthyroidism 02/08/2018  ? ? ?Past Surgical History:  ?Procedure Laterality Date  ? CESAREAN SECTION N/A 03/12/2019  ? Procedure: CESAREAN SECTION;  Surgeon: Levie Heritage, DO;  Location: [facility];  Service: Obstetrics;  Laterality: N/A;  ? CESAREAN SECTION MULTI-GESTATIONAL N/A 03/08/2021  ? Procedure: CESAREAN SECTION MULTI-GESTATIONAL;  Surgeon: Lazaro Arms, MD;  Location: [facility];  Service: Obstetrics;  Laterality: N/A;  ? TONSILLECTOMY    ? ? ?OB History   ? ? Gravida  ?2  ? Para  ?2  ? Term  ?2  ? Preterm  ?   ? AB  ?   ? Living  ?3  ?  ? ? SAB  ?   ? IAB  ?   ? Ectopic  ?   ? Multiple  ?1  ? Live Births  ?3  ?   ?  ?  ? ? ? ?Home Medications   ? ?Prior to Admission medications   ?Medication Sig Start Date End Date Taking? Authorizing Provider  ?ofloxacin (OCUFLOX) 0.3 % ophthalmic solution Place 1 drop into both eyes 4 (four) times daily. 03/10/22  Yes Particia Nearing, PA-C  ?EPINEPHrine 0.3 mg/0.3 mL IJ SOAJ injection Inject 0.3 mg into the muscle as needed for  anaphylaxis. 07/23/21   [provider]  ?NIFEdipine (PROCARDIA XL/NIFEDICAL-XL) 90 MG 24 hr tablet Take 1 tablet (90 mg total) by mouth daily. 07/23/21   Heather Roberts, NP  ? ? ?Family History ?Family History  ?Problem Relation Age of Onset  ? Hyperlipidemia Mother   ? Hypertension Father   ? Thyroid disease Maternal Grandmother   ? Thyroid disease Paternal Grandmother   ? ? ?Social History ?Social History  ? ?Tobacco Use  ? Smoking status: Never  ? Smokeless tobacco: Never  ?Vaping Use  ? Vaping Use: Never used  ?Substance Use Topics  ? Alcohol use: Not Currently  ?  Comment: Pt did have wine on 08/05/2018  ? Drug use: Not Currently  ?  Types: Marijuana  ?  Comment: not since 07/03/18  ? ? ? ?Allergies   ?Patient has no known allergies. ? ? ?Review of Systems ?Review of Systems ?Per HPI ? ?Physical Exam ?Triage Vital Signs ?ED Triage Vitals  ?Enc Vitals Group  ?   BP 03/10/22 0932 (!) 155/103  ?   Pulse Rate 03/10/22 0932 60  ?   Resp 03/10/22 0932 18  ?   Temp 03/10/22 0932 98.2 ?F (36.8 ?C)  ?  Temp Source 03/10/22 0932 Oral  ?   SpO2 03/10/22 0932 97 %  ?   Weight 03/10/22 0934 130 lb (59 kg)  ?   Height 03/10/22 0934 5\' 2"  (1.575 m)  ?   Head Circumference --   ?   Peak Flow --   ?   Pain Score 03/10/22 0933 0  ?   Pain Loc --   ?   Pain Edu? --   ?   Excl. in GC? --   ? ?No data found. ? ?Updated Vital Signs ?BP (!) 155/103 (BP Location: Right Arm) Comment: second attempt for bp. pt reports history of HTN and reports has not taken medication this am.  Pulse 60   Temp 98.2 ?F (36.8 ?C) (Oral)   Resp 18   Ht 5\' 2"  (1.575 m)   Wt 130 lb (59 kg)   LMP 01/26/2022 (Approximate)   SpO2 97%   Breastfeeding Yes   BMI 23.78 kg/m?  ? ?Visual Acuity ?Right Eye Distance:   ?Left Eye Distance:   ?Bilateral Distance:   ? ?Right Eye Near:   ?Left Eye Near:    ?Bilateral Near:    ? ?Physical Exam ?Vitals and nursing note reviewed.  ?Constitutional:   ?   Appearance: Normal appearance. She is not ill-appearing.   ?HENT:  ?   Head: Atraumatic.  ?   Nose: Nose normal.  ?   Mouth/Throat:  ?   Mouth: Mucous membranes are moist.  ?   Pharynx: Oropharynx is clear.  ?Eyes:  ?   Extraocular Movements: Extraocular movements intact.  ?   Pupils: Pupils are equal, round, and reactive to light.  ?   Comments: Bilateral conjunctiva diffusely erythematous, injected, crusting present on lash lines  ?Cardiovascular:  ?   Rate and Rhythm: Normal rate and regular rhythm.  ?   Heart sounds: Normal heart sounds.  ?Pulmonary:  ?   Effort: Pulmonary effort is normal.  ?   Breath sounds: Normal breath sounds.  ?Musculoskeletal:     ?   General: Normal range of motion.  ?   Cervical back: Normal range of motion and neck supple.  ?Skin: ?   General: Skin is warm and dry.  ?Neurological:  ?   Mental Status: She is alert and oriented to person, place, and time.  ?   Motor: No weakness.  ?   Gait: Gait normal.  ?Psychiatric:     ?   Mood and Affect: Mood normal.     ?   Thought Content: Thought content normal.     ?   Judgment: Judgment normal.  ? ?UC Treatments / Results  ?Labs ?(all labs ordered are listed, but only abnormal results are displayed) ?Labs Reviewed - No data to display ? ?EKG ? ? ?Radiology ?No results found. ? ?Procedures ?Procedures (including critical care time) ? ?Medications Ordered in UC ?Medications - No data to display ? ?Initial Impression / Assessment and Plan / UC Course  ?I have reviewed the triage vital signs and the nursing notes. ? ?Pertinent labs & imaging results that were available during my care of the patient were reviewed by me and considered in my medical decision making (see chart for details). ? ?  ? ?Treat with Ocuflox drops, good hand hygiene, warm compresses.  Return for worsening symptoms. ? ?Final Clinical Impressions(s) / UC Diagnoses  ? ?Final diagnoses:  ?Acute bacterial conjunctivitis of both eyes  ? ?Discharge Instructions   ?None ?  ? ?  ED Prescriptions   ? ? Medication Sig Dispense Auth. Provider  ?  ofloxacin (OCUFLOX) 0.3 % ophthalmic solution Place 1 drop into both eyes 4 (four) times daily. 5 mL Particia NearingLane, Sita Mangen Elizabeth, [place]PA-C  ? ?  ? ?PDMP not reviewed this encounter. ?  ?Particia NearingLane, Larie Mathes Elizabeth, PA-C ?03/10/22 1020 ? ?

## 2022-03-10 NOTE — ED Triage Notes (Signed)
Pt reports bilateral eye drainage and "crusted over" in the mornings since Sunday. Pt reports otc eye drops are not helping.  ? ? ?

## 2022-03-18 ENCOUNTER — Emergency Department (HOSPITAL_COMMUNITY)
Admission: EM | Admit: 2022-03-18 | Discharge: 2022-03-18 | Discharge status: Against Medical Advice | Payer: No Typology Code available for payment source | Attending: Emergency Medicine | Admitting: Emergency Medicine

## 2022-03-18 ENCOUNTER — Encounter (HOSPITAL_COMMUNITY): Payer: Self-pay | Admitting: *Deleted

## 2022-03-18 DIAGNOSIS — R110 Nausea: Secondary | ICD-10-CM | POA: Insufficient documentation

## 2022-03-18 DIAGNOSIS — R519 Headache, unspecified: Secondary | ICD-10-CM | POA: Diagnosis not present

## 2022-03-18 DIAGNOSIS — Z5321 Procedure and treatment not carried out due to patient leaving prior to being seen by health care provider: Secondary | ICD-10-CM | POA: Diagnosis not present

## 2022-03-18 NOTE — ED Triage Notes (Signed)
Pt in c/o HA onset today, pt c/o nausea, denies v/d, A&O x4 ?

## 2022-03-29 ENCOUNTER — Encounter: Payer: Self-pay | Admitting: Gastroenterology

## 2022-03-29 ENCOUNTER — Encounter: Payer: Self-pay | Admitting: Internal Medicine

## 2022-03-29 ENCOUNTER — Ambulatory Visit (INDEPENDENT_AMBULATORY_CARE_PROVIDER_SITE_OTHER): Payer: No Typology Code available for payment source | Admitting: Gastroenterology

## 2022-03-29 DIAGNOSIS — R748 Abnormal levels of other serum enzymes: Secondary | ICD-10-CM

## 2022-03-29 NOTE — Patient Instructions (Signed)
At your convenience please go to [facility] for labs. We will be in touch in 3-5 business days with results.  ?

## 2022-03-29 NOTE — Progress Notes (Signed)
? ? ? ?GI Office Note   ? ?Referring Provider: No ref. provider found ?Primary Care Physician:  Renee Rival, FNP  ?Primary Gastroenterologist: Elon Alas. Abbey Chatters, DO ? ? ?Chief Complaint  ? ?Chief Complaint  ?Patient presents with  ? abnormal labs  ? ? ? ?History of Present Illness  ? ?Molly Burton is a 26 y.o. female presenting today at the request of Vena Rua FNP for further evaluation of elevated isolated alk phos. She has had mildly elevated alk phos on several occasions as outlined below. Patient delivered twins 02/2021, and also had singleton birth 02/2020.  ? ?Clinically no GI complaints. No abdominal pain. No n/v. No heartburn. BM regular. No melena, brbpr. She has a lot of issues with her thyroid and follows with endocrinology. She has struggle with maintaining weight. She continues with nursing the twins, predominantly at night. She struggles with fatigue. No FH of liver disease. She denies having been told of any liver issues in the past.  ? ?Component ?    Latest Ref Rng 01/12/2021 03/08/2021 07/26/2021 12/13/2021  ?Albumin ?    3.5 - 5.0 g/dL 3.5 (L)  2.8 (L)  5.3 (H)  4.9   ?AST ?    15 - 41 U/L _0 ?ALT ?    0 - 44 U/L _1 ?Alkaline Phosphatase ?    38 - 126 U/L 89  143 (H)  147 (H)  137 (H)   ?Total Bilirubin ?    0.3 - 1.2 mg/dL 0.2  0.4  0.4  0.4   ? ?Component ?    Latest Ref Rng 12/16/2021  ?Albumin ?    3.5 - 5.0 g/dL 5.2 (H)   ?AST ?    15 - 41 U/L 19   ?ALT ?    0 - 44 U/L 17   ?Alkaline Phosphatase ?    38 - 126 U/L 123   ?Total Bilirubin ?    0.3 - 1.2 mg/dL 0.8   ?  ? ? ? ? ?Medications  ? ?Current Outpatient Medications  ?Medication Sig Dispense Refill  ? EPINEPHrine 0.3 mg/0.3 mL IJ SOAJ injection Inject 0.3 mg into the muscle as needed for anaphylaxis.    ? NIFEdipine (PROCARDIA XL/NIFEDICAL-XL) 90 MG 24 hr tablet Take 1 tablet (90 mg total) by mouth daily. 90 tablet 1  ? ofloxacin (OCUFLOX) 0.3 % ophthalmic solution Place 1 drop into both eyes 4  (four) times daily. (Patient not taking: Reported on 03/29/2022) 5 mL 0  ? ?No current facility-administered medications for this visit.  ? ? ?Allergies  ? ?Allergies as of 03/29/2022  ? (No Known Allergies)  ? ? ?Past Medical History  ? ?Past Medical History:  ?Diagnosis Date  ? Hypertension   ? started during pregnancy  ? ? ?Past Surgical History  ? ?Past Surgical History:  ?Procedure Laterality Date  ? CESAREAN SECTION N/A 03/12/2019  ? Procedure: CESAREAN SECTION;  Surgeon: Truett Mainland, DO;  Location: [facility];  Service: Obstetrics;  Laterality: N/A;  ? CESAREAN SECTION MULTI-GESTATIONAL N/A 03/08/2021  ? Procedure: CESAREAN SECTION MULTI-GESTATIONAL;  Surgeon: Florian Buff, MD;  Location: [facility];  Service: Obstetrics;  Laterality: N/A;  ? TONSILLECTOMY    ? ? ?Past Family History  ? ?Family History  ?Problem Relation Age of Onset  ? Hyperlipidemia Mother   ? Hypertension Father   ? Thyroid  disease Maternal Grandmother   ? Thyroid disease Paternal Grandmother   ? Colon cancer Neg Hx   ? Celiac disease Neg Hx   ? Liver disease Neg Hx   ? Inflammatory bowel disease Neg Hx   ? ? ?Past Social History  ? ?Social History  ? ?Socioeconomic History  ? Marital status: Single  ?  Spouse name: Not on file  ? Number of children: 3  ? Years of education: N/A  ? Highest education level: Bachelor's degree (e.g., BA, AB, BS)  ?Occupational History  ? Occupation: Manufacturing engineer [employer]  ?  Comment: Insurance- Group coverage- underwriting  ?Tobacco Use  ? Smoking status: Never  ? Smokeless tobacco: Never  ?Vaping Use  ? Vaping Use: Never used  ?Substance and Sexual Activity  ? Alcohol use: Not Currently  ?  Comment: Pt did have wine on 08/05/2018. no etoh 03/29/22  ? Drug use: Not Currently  ?  Types: Marijuana  ?  Comment: not since 07/03/18  ? Sexual activity: Not Currently  ?  Birth control/protection: None  ?Other Topics Concern  ? Not on file  ?Social History Narrative  ? Not on file  ? ?Social Determinants of Health   ? ?Financial Resource Strain: Low Risk   ? Difficulty of Paying Living Expenses: Not hard at all  ?Food Insecurity: No Food Insecurity  ? Worried About Charity fundraiser in the Last Year: Never true  ? Ran Out of Food in the Last Year: Never true  ?Transportation Needs: No Transportation Needs  ? Lack of Transportation (Medical): No  ? Lack of Transportation (Non-Medical): No  ?Physical Activity: Inactive  ? Days of Exercise per Week: 0 days  ? Minutes of Exercise per Session: 0 min  ?Stress: No Stress Concern Present  ? Feeling of Stress : Not at all  ?Social Connections: Moderately Isolated  ? Frequency of Communication with Friends and Family: More than three times a week  ? Frequency of Social Gatherings with Friends and Family: Once a week  ? Attends Religious Services: 1 to 4 times per year  ? Active Member of Clubs or Organizations: No  ? Attends Archivist Meetings: Never  ? Marital Status: Never married  ?Intimate Partner Violence: Not At Risk  ? Fear of Current or Ex-Partner: No  ? Emotionally Abused: No  ? Physically Abused: No  ? Sexually Abused: No  ? ? ?Review of Systems  ? ?General: Negative for anorexia, weight loss, fever, chills,  weakness. See hpi ?Eyes: Negative for vision changes.  ?ENT: Negative for hoarseness, difficulty swallowing , nasal congestion. ?CV: Negative for chest pain, angina, palpitations, dyspnea on exertion, peripheral edema.  ?Respiratory: Negative for dyspnea at rest, dyspnea on exertion, cough, sputum, wheezing.  ?GI: See history of present illness. ?GU:  Negative for dysuria, hematuria, urinary incontinence, urinary frequency, nocturnal urination.  ?MS: Negative for joint pain, low back pain.  ?Derm: Negative for rash or itching.  ?Neuro: Negative for weakness, abnormal sensation, seizure, frequent headaches, memory loss,  ?confusion.  ?Psych: Negative for  suicidal ideation, hallucinations. Some postpartum depression she is addressing. ?Endo: Negative for  unusual weight change.  ?Heme: Negative for bruising or bleeding. ?Allergy: Negative for rash or hives. ? ?Physical Exam  ? ?BP 118/70 (BP Location: Right Arm, Patient Position: Sitting, Cuff Size: Normal)   Pulse 88   Temp (!) 97.3 ?F (36.3 ?C) (Temporal)   Ht _0  (1.575 m)   Wt 132 lb 9.6 oz (  60.1 kg)   LMP 03/18/2022 (Approximate)   SpO2 98%   Breastfeeding Yes   BMI 24.25 kg/m?  ?  ?General: Well-nourished, well-developed in no acute distress.  ?Head: Normocephalic, atraumatic.   ?Eyes: Conjunctiva pink, no icterus. ?Mouth: Oropharyngeal mucosa moist and pink , no lesions erythema or exudate. ?Neck: Supple without thyromegaly, masses, or lymphadenopathy.  ?Lungs: Clear to auscultation bilaterally.  ?Heart: Regular rate and rhythm, no murmurs rubs or gallops.  ?Abdomen: Bowel sounds are normal, nontender, nondistended, no hepatosplenomegaly or masses,  ?no abdominal bruits or hernia, no rebound or guarding.   ?Rectal: not performed ?Extremities: No lower extremity edema. No clubbing or deformities.  ?Neuro: Alert and oriented x 4 , grossly normal neurologically.  ?Skin: Warm and dry, no rash or jaundice.   ?Psych: Alert and cooperative, normal mood and affect. ? ?Labs  ? ?Lab Results  ?Component Value Date  ? CREATININE 0.65 12/16/2021  ? BUN 16 12/16/2021  ? NA 140 12/16/2021  ? K 3.6 12/16/2021  ? CL 102 12/16/2021  ? CO2 24 12/16/2021  ? ?Lab Results  ?Component Value Date  ? ALT 17 12/16/2021  ? AST 19 12/16/2021  ? ALKPHOS 123 12/16/2021  ? BILITOT 0.8 12/16/2021  ? ?Lab Results  ?Component Value Date  ? WBC 11.8 (H) 12/16/2021  ? HGB 15.4 (H) 12/16/2021  ? HCT 47.3 (H) 12/16/2021  ? MCV 86.9 12/16/2021  ? PLT 208 12/16/2021  ? ? ?Imaging Studies  ? ?No results found. ? ?Assessment  ? ?Isolated alkaline phosphatase: mildly elevated as outlined. Other LFTs normal. Suspect could have been related to pregnancy, explaining April and August 2022. However, given persistent mild elevated in 11/2021, we  will work up to exclude liver source.  ? ? ?PLAN  ? ?LFTs, GGT, AMA. If abnormal, then pursue abdominal u/s.  ? ? ?Laureen Ochs. Mckinsley Koelzer, MHS, PA-C ?[facility] ? ?

## 2022-03-31 ENCOUNTER — Telehealth: Payer: Self-pay | Admitting: Nurse Practitioner

## 2022-03-31 DIAGNOSIS — R7989 Other specified abnormal findings of blood chemistry: Secondary | ICD-10-CM

## 2022-03-31 DIAGNOSIS — E0590 Thyrotoxicosis, unspecified without thyrotoxic crisis or storm: Secondary | ICD-10-CM

## 2022-03-31 NOTE — Telephone Encounter (Signed)
done

## 2022-03-31 NOTE — Telephone Encounter (Signed)
Patient said she has been having symptoms of fatigue, feels like she is out of breath constantly. She is on the way to get her labs done and would like you to keep check on that tomorrow morning so she knows if she needs RX change. Can you place labs so we have them for august ?

## 2022-04-01 DIAGNOSIS — R5383 Other fatigue: Secondary | ICD-10-CM | POA: Diagnosis not present

## 2022-04-01 DIAGNOSIS — E663 Overweight: Secondary | ICD-10-CM | POA: Diagnosis not present

## 2022-04-01 DIAGNOSIS — E0590 Thyrotoxicosis, unspecified without thyrotoxic crisis or storm: Secondary | ICD-10-CM | POA: Diagnosis not present

## 2022-04-01 LAB — T4, FREE: Free T4: 1.12 ng/dL (ref 0.82–1.77)

## 2022-04-01 LAB — HEPATIC FUNCTION PANEL
ALT: 19 IU/L (ref 0–32)
AST: 21 IU/L (ref 0–40)
Albumin: 4.9 g/dL (ref 3.9–5.0)
Alkaline Phosphatase: 136 IU/L — ABNORMAL HIGH (ref 44–121)
Bilirubin Total: 0.3 mg/dL (ref 0.0–1.2)
Bilirubin, Direct: <0.1 mg/dL (ref 0.00–0.40)
Total Protein: 7.6 g/dL (ref 6.0–8.5)

## 2022-04-01 LAB — GAMMA GT: GGT: 13 IU/L (ref 0–60)

## 2022-04-01 LAB — TSH: TSH: 0.655 u[IU]/mL (ref 0.450–4.500)

## 2022-04-01 LAB — T3, FREE: T3, Free: 3 pg/mL (ref 2.0–4.4)

## 2022-04-01 LAB — MITOCHONDRIAL ANTIBODIES: Mitochondrial Ab: <20 Units (ref 0.0–20.0)

## 2022-04-01 NOTE — Progress Notes (Signed)
Her thyroid labs are completely normal.  I dont believe her thyroid is the primary cause of her symptoms.  She needs to follow up with PCP to have additional testing done to find the cause.

## 2022-04-06 ENCOUNTER — Ambulatory Visit (INDEPENDENT_AMBULATORY_CARE_PROVIDER_SITE_OTHER): Payer: No Typology Code available for payment source | Admitting: Medical

## 2022-04-06 ENCOUNTER — Encounter: Payer: Self-pay | Admitting: Medical

## 2022-04-06 VITALS — BP 125/88 | HR 85 | Ht 62.0 in | Wt 133.5 lb

## 2022-04-06 DIAGNOSIS — N6459 Other signs and symptoms in breast: Secondary | ICD-10-CM | POA: Diagnosis not present

## 2022-04-06 NOTE — Progress Notes (Signed)
? ?  History:  ?Molly Burton is a 26 y.o. G2P2003 who presents to clinic today for breast pain. She has been breastfeeding her twins for a little over a year. She had previously stopped daytime feeds and was just feeding at night. She stopped feeding altogether last Thursday. She has not pumped since then either. She has severe diffuse pain in both breasts. Breast are firm. She denies erythema, focal area of pain or fever. She is not taking anything for pain.   ? ?The following portions of the patient's history were reviewed and updated as appropriate: allergies, current medications, family history, past medical history, social history, past surgical history and problem list. ? ?Review of Systems:  ?Review of Systems  ?Constitutional:  Negative for chills and fever.  ?Gastrointestinal:  Negative for abdominal pain.  ? ?  ?Objective:  ?Physical Exam ?BP 125/88 (BP Location: Left Arm, Patient Position: Sitting, Cuff Size: Normal)   Pulse 85   Ht 5\' 2"  (1.575 m)   Wt 133 lb 8 oz (60.6 kg)   LMP 03/08/2022 (Approximate)   Breastfeeding No   BMI 24.42 kg/m?  ?Physical Exam ?Constitutional:   ?   General: She is not in acute distress. ?   Appearance: Normal appearance. She is normal weight.  ?Cardiovascular:  ?   Rate and Rhythm: Normal rate.  ?Pulmonary:  ?   Effort: Pulmonary effort is normal.  ?Chest:  ?Breasts: ?   Right: Swelling, nipple discharge and tenderness present. No bleeding, inverted nipple, mass or skin change.  ?   Left: Swelling, nipple discharge and tenderness present. No bleeding, inverted nipple, mass or skin change.  ?Skin: ?   General: Skin is warm and dry.  ?   Coloration: Skin is not pale.  ?Neurological:  ?   Mental Status: She is alert and oriented to person, place, and time.  ?Psychiatric:     ?   Mood and Affect: Mood normal.  ? ? ?Health Maintenance Due  ?Topic Date Due  ? COVID-19 Vaccine (3 - Booster for Moderna series) 04/29/2020  ? PAP-Cervical Cytology Screening  08/29/2021   ? PAP SMEAR-Modifier  08/29/2021  ? ? ? ?Assessment & Plan:  ?1. Engorgement of breast ?- Currently no evidence of infection in either breast ?- Patient counseled on weaning with limited pumping to reduce the risk of mastitis ?- Cabbage leaves already being used will continue  ?- Call in 1 week if no improvement, will consult lactation if needed.  ? ?Approximately 15 minutes of total time was spent with this patient on history taking, patient education, physical exam and documentation ? ?Luvenia Redden, PA-C ?04/06/2022 ?1:07 PM ? ?

## 2022-04-12 ENCOUNTER — Ambulatory Visit: Payer: No Typology Code available for payment source | Admitting: Nurse Practitioner

## 2022-04-29 ENCOUNTER — Other Ambulatory Visit: Payer: Self-pay

## 2022-04-29 DIAGNOSIS — R7989 Other specified abnormal findings of blood chemistry: Secondary | ICD-10-CM

## 2022-04-29 DIAGNOSIS — R748 Abnormal levels of other serum enzymes: Secondary | ICD-10-CM

## 2022-06-13 ENCOUNTER — Ambulatory Visit
Admission: EM | Admit: 2022-06-13 | Discharge: 2022-06-13 | Disposition: A | Discharge status: Home / Self Care | Payer: Medicaid Other | Attending: Nurse Practitioner | Admitting: Nurse Practitioner

## 2022-06-13 DIAGNOSIS — J309 Allergic rhinitis, unspecified: Secondary | ICD-10-CM | POA: Diagnosis not present

## 2022-06-13 DIAGNOSIS — H1013 Acute atopic conjunctivitis, bilateral: Secondary | ICD-10-CM | POA: Diagnosis not present

## 2022-06-13 MED ORDER — OLOPATADINE HCL 0.1 % OP SOLN: 1.0000 [drp] | Freq: Two times a day (BID) | OPHTHALMIC | 0 refills | Status: DC

## 2022-06-13 MED ORDER — FLUTICASONE PROPIONATE 50 MCG/ACT NA SUSP: 2.0000 | Freq: Every day | NASAL | 0 refills | Status: DC

## 2022-06-13 MED ORDER — CETIRIZINE HCL 10 MG PO TABS
10.0000 mg | ORAL_TABLET | Freq: Every day | ORAL | 0 refills | Status: DC
Start: 2022-06-13 — End: 2023-07-18

## 2022-06-13 NOTE — Discharge Instructions (Addendum)
Take medication as directed. Increase fluids and get plenty of rest. May take over-the-counter ibuprofen or Tylenol as needed for pain, fever, or general discomfort. Cool compresses to the eyes to help with eye irritation, itching, and swelling. Do not rub or manipulate the eyes while symptoms persist. Recommend normal saline nasal spray to help with nasal congestion throughout the day. If your symptoms fail to improve within the next 7 to 10 days, please follow-up in our clinic.

## 2022-06-13 NOTE — ED Provider Notes (Signed)
[facility]    CSN: [id number] Arrival date & time: 06/13/22  0802      History   Chief Complaint Chief Complaint  Patient presents with   Conjunctivitis    HPI Molly Burton is a 26 y.o. female.    Conjunctivitis    Patient presents for complaints of eye redness, eye itching, and watering.  Symptoms have been present for the past 3 days.  She also complains of nasal congestion and runny nose.  She denies fever, chills, headache, ear pain, blurred vision, change in vision, or loss of vision.  She states that she does have a history of seasonal allergies.  She states that she normally takes Claritin for her allergies, but only as needed.  She denies any other sick contacts.  Reports history of wearing glasses only.  Patient Active Problem List   Diagnosis Date Noted   Elevated alkaline phosphatase level 03/29/2022   Fatigue 07/23/2021   Headache 07/23/2021   Encounter for general adult medical examination with abnormal findings 06/21/2021   Callus of heel 06/21/2021   Postpartum hypertension 03/23/2021   Acute blood loss anemia 03/09/2021   History of cesarean delivery 09/16/2020   H/O pre-eclampsia 09/16/2020   Arthralgia    Hyperthyroidism 02/08/2018    Past Surgical History:  Procedure Laterality Date   CESAREAN SECTION N/A 03/12/2019   Procedure: CESAREAN SECTION;  Surgeon: Levie Heritage, DO;  Location: [facility];  Service: Obstetrics;  Laterality: N/A;   CESAREAN SECTION MULTI-GESTATIONAL N/A 03/08/2021   Procedure: CESAREAN SECTION MULTI-GESTATIONAL;  Surgeon: Lazaro Arms, MD;  Location: [facility];  Service: Obstetrics;  Laterality: N/A;   TONSILLECTOMY      OB History     Gravida  2   Para  2   Term  2   Preterm      AB      Living  3      SAB      IAB      Ectopic      Multiple  1   Live Births  3            Home Medications    Prior to Admission medications   Medication Sig Start Date End Date Taking?  Authorizing Provider  cetirizine (ZYRTEC) 10 MG tablet Take 1 tablet (10 mg total) by mouth daily. 06/13/22  Yes Diane Hanel-Warren, Sadie Haber, NP  fluticasone (FLONASE) 50 MCG/ACT nasal spray Place 2 sprays into both nostrils daily. 06/13/22  Yes Chaim Gatley-Warren, Sadie Haber, NP  olopatadine (PATANOL) 0.1 % ophthalmic solution Place 1 drop into both eyes 2 (two) times daily. 06/13/22  Yes Reita Shindler-Warren, Sadie Haber, NP  EPINEPHrine 0.3 mg/0.3 mL IJ SOAJ injection Inject 0.3 mg into the muscle as needed for anaphylaxis. 07/23/21   [provider]  NIFEdipine (PROCARDIA XL/NIFEDICAL-XL) 90 MG 24 hr tablet Take 1 tablet (90 mg total) by mouth daily. 07/23/21   Heather Roberts, NP    Family History Family History  Problem Relation Age of Onset   Hyperlipidemia Mother    Hypertension Father    Thyroid disease Maternal Grandmother    Thyroid disease Paternal Grandmother    Colon cancer Neg Hx    Celiac disease Neg Hx    Liver disease Neg Hx    Inflammatory bowel disease Neg Hx     Social History Social History   Tobacco Use   Smoking status: Never   Smokeless tobacco: Never  Vaping Use  Vaping Use: Never used  Substance Use Topics   Alcohol use: Not Currently    Comment: Pt did have wine on 08/05/2018. no etoh 03/29/22   Drug use: Not Currently    Types: Marijuana    Comment: not since 07/03/18     Allergies   Patient has no known allergies.   Review of Systems Review of Systems Per HPI  Physical Exam Triage Vital Signs ED Triage Vitals  Enc Vitals Group     BP 06/13/22 0818 103/70     Pulse Rate 06/13/22 0818 74     Resp 06/13/22 0818 18     Temp 06/13/22 0818 98.6 F (37 C)     Temp src --      SpO2 06/13/22 0818 98 %     Weight --      Height --      Head Circumference --      Peak Flow --      Pain Score 06/13/22 0817 0     Pain Loc --      Pain Edu? --      Excl. in GC? --    No data found.  Updated Vital Signs BP 103/70   Pulse 74   Temp 98.6 F (37 C)    Resp 18   LMP 06/05/2022   SpO2 98%   Visual Acuity Right Eye Distance:   Left Eye Distance:   Bilateral Distance:    Right Eye Near:   Left Eye Near:    Bilateral Near:     Physical Exam Vitals and nursing note reviewed.  Constitutional:      General: She is not in acute distress.    Appearance: Normal appearance. She is well-developed.  HENT:     Head: Normocephalic.     Right Ear: Tympanic membrane, ear canal and external ear normal.     Left Ear: Tympanic membrane, ear canal and external ear normal.     Nose: Congestion and rhinorrhea present. Rhinorrhea is clear.     Right Turbinates: Enlarged and swollen.     Left Turbinates: Enlarged and swollen.     Right Sinus: No maxillary sinus tenderness or frontal sinus tenderness.     Left Sinus: No maxillary sinus tenderness or frontal sinus tenderness.     Mouth/Throat:     Mouth: Mucous membranes are moist.     Pharynx: No oropharyngeal exudate or posterior oropharyngeal erythema.  Eyes:     General:        Right eye: No discharge.        Left eye: No discharge.     Extraocular Movements: Extraocular movements intact.     Conjunctiva/sclera: Conjunctivae normal.     Pupils: Pupils are equal, round, and reactive to light.  Cardiovascular:     Rate and Rhythm: Normal rate and regular rhythm.     Pulses: Normal pulses.     Heart sounds: Normal heart sounds.  Pulmonary:     Effort: Pulmonary effort is normal.     Breath sounds: Normal breath sounds.  Abdominal:     General: Bowel sounds are normal. There is no distension.     Palpations: Abdomen is soft.     Tenderness: There is no abdominal tenderness. There is no guarding or rebound.  Genitourinary:    Vagina: Normal. No vaginal discharge.  Musculoskeletal:     Cervical back: Normal range of motion.  Skin:    General: Skin is warm and dry.  Findings: No erythema or rash.  Neurological:     General: No focal deficit present.     Mental Status: She is alert and  oriented to person, place, and time.     Cranial Nerves: No cranial nerve deficit.  Psychiatric:        Mood and Affect: Mood normal.        Behavior: Behavior normal.      UC Treatments / Results  Labs (all labs ordered are listed, but only abnormal results are displayed) Labs Reviewed - No data to display  EKG   Radiology No results found.  Procedures Procedures (including critical care time)  Medications Ordered in UC Medications - No data to display  Initial Impression / Assessment and Plan / UC Course  I have reviewed the triage vital signs and the nursing notes.  Pertinent labs & imaging results that were available during my care of the patient were reviewed by me and considered in my medical decision making (see chart for details).  Patient presents for complaints of eye redness, eye itchiness, and eye irritation.  Symptoms have been present for the past 3 days.  On exam, patient has moderate nasal congestion and rhinorrhea.  Her eye symptoms appear to be more consistent with allergic conjunctivitis as she has no obvious redness, mucopurulent drainage, or swelling of her eyes.  We will start patient on cetirizine, fluticasone, and Pataday eyedrops.  Supportive care recommendations were provided to the patient.  Patient advised to follow-up if symptoms do not improve. Final Clinical Impressions(s) / UC Diagnoses   Final diagnoses:  Allergic conjunctivitis and rhinitis, bilateral     Discharge Instructions      Take medication as directed. Increase fluids and get plenty of rest. May take over-the-counter ibuprofen or Tylenol as needed for pain, fever, or general discomfort. Cool compresses to the eyes to help with eye irritation, itching, and swelling. Do not rub or manipulate the eyes while symptoms persist. Recommend normal saline nasal spray to help with nasal congestion throughout the day. If your symptoms fail to improve within the next 7 to 10 days, please  follow-up in our clinic.      ED Prescriptions     Medication Sig Dispense Auth. Provider   olopatadine (PATANOL) 0.1 % ophthalmic solution Place 1 drop into both eyes 2 (two) times daily. 5 mL Brantleigh Mifflin-Warren, Sadie Haber, NP   cetirizine (ZYRTEC) 10 MG tablet Take 1 tablet (10 mg total) by mouth daily. 30 tablet Rosealie Reach-Warren, Sadie Haber, NP   fluticasone (FLONASE) 50 MCG/ACT nasal spray Place 2 sprays into both nostrils daily. 16 g Zanden Colver-Warren, Sadie Haber, NP      PDMP not reviewed this encounter.   Abran Cantor, NP 06/13/22 0830

## 2022-06-13 NOTE — ED Triage Notes (Signed)
Pt presents with c/o watery and itchy eyes that become red throughout the day, symptoms began 3 days ago

## 2022-06-22 ENCOUNTER — Telehealth: Payer: Self-pay

## 2022-06-22 DIAGNOSIS — J302 Other seasonal allergic rhinitis: Secondary | ICD-10-CM

## 2022-06-22 DIAGNOSIS — T7800XD Anaphylactic reaction due to unspecified food, subsequent encounter: Secondary | ICD-10-CM

## 2022-06-22 NOTE — Telephone Encounter (Signed)
Patient is scheduled to see you for a follow up on 06/29/22. She is also scheduled to start allergy shots on 07/13/22.  Thanks

## 2022-06-23 NOTE — Addendum Note (Signed)
Addended by: Alfonse Spruce on: 06/23/2022 06:27 PM   Modules accepted: Orders

## 2022-06-23 NOTE — Telephone Encounter (Signed)
I wrote the script and routing to the immunotherapy team.  Malachi Bonds, MD [facility]

## 2022-06-23 NOTE — Telephone Encounter (Signed)
Noted  

## 2022-06-27 DIAGNOSIS — J3081 Allergic rhinitis due to animal (cat) (dog) hair and dander: Secondary | ICD-10-CM | POA: Diagnosis not present

## 2022-06-27 NOTE — Progress Notes (Signed)
VIALS EXP 06-28-23

## 2022-06-27 NOTE — Progress Notes (Signed)
Aeroallergen Immunotherapy   Ordering Provider: Dr. Malachi Bonds   Patient Details  Name: Molly Burton  MRN: [id number]  Date of Birth: 1996/09/18   Order 2 of 2   Vial Label: RW/Molds/CR/DM   0.3 ml (Volume)  1:20 Concentration -- Ragweed Mix  0.2 ml (Volume)  1:20 Concentration -- Alternaria alternata  0.2 ml (Volume)  1:20 Concentration -- Cladosporium herbarum  0.2 ml (Volume)  1:10 Concentration -- Mucor plumbeus  0.2 ml (Volume)  1:10 Concentration -- Fusarium moniliforme  0.2 ml (Volume)  1:40 Concentration -- Aureobasidium pullulans  0.2 ml (Volume)  1:10 Concentration -- Rhizopus oryzae  0.3 ml (Volume)  1:20 Concentration -- Cockroach, German  0.5 ml (Volume)   AU Concentration -- Mite Mix (DF 5,000 & DP 5,000)    2.3  ml Extract Subtotal  2.7  ml Diluent  5.0  ml Maintenance Total   Schedule:  A   Blue Vial (1:100,000): Schedule A (10 doses)  Yellow Vial (1:10,000): Schedule A (10 doses)  Green Vial (1:1,000): Schedule A (10 doses)  Red Vial (1:100): Schedule A (10 doses)   Special Instructions: none

## 2022-06-27 NOTE — Progress Notes (Signed)
Aeroallergen Immunotherapy   Ordering Provider: Dr. Malachi Bonds   Patient Details  Name: Molly Burton  MRN: [id number]  Date of Birth: 07-31-1996   Order 1 of 2   Vial Label: G/WM/T/C/D   0.3 ml (Volume)  BAU Concentration -- 7 Grass Mix* 100,000 ([address] [place], [place], [place], [place], [place], Sweet Vernal, Timothy)  0.2 ml (Volume)  1:20 Concentration -- [place]  0.3 ml (Volume)  BAU Concentration -- [place] 10,000  0.2 ml (Volume)  1:20 Concentration -- Johnson  0.2 ml (Volume)  1:20 Concentration -- Cocklebur  0.2 ml (Volume)  1:20 Concentration -- Burweed [place]  0.5 ml (Volume)  1:20 Concentration -- Weed Mix*  0.5 ml (Volume)  1:20 Concentration -- Eastern 10 Tree Mix (also Sweet Gum)  0.2 ml (Volume)  1:10 Concentration -- Pine Mix  0.2 ml (Volume)  1:20 Concentration -- Walnut, Black Pollen  0.5 ml (Volume)  1:10 Concentration -- Cat Hair  0.5 ml (Volume)  1:10 Concentration -- Dog Epithelia    3.8  ml Extract Subtotal  1.2  ml Diluent  5.0  ml Maintenance Total   Schedule:  A   Blue Vial (1:100,000): Schedule A (10 doses)  Yellow Vial (1:10,000): Schedule A (10 doses)  Green Vial (1:1,000): Schedule A (10 doses)  Red Vial (1:100): Schedule A (10 doses)   Special Instructions: none

## 2022-06-28 DIAGNOSIS — J3089 Other allergic rhinitis: Secondary | ICD-10-CM | POA: Diagnosis not present

## 2022-06-29 ENCOUNTER — Encounter: Payer: Self-pay | Admitting: Allergy & Immunology

## 2022-06-29 ENCOUNTER — Ambulatory Visit (INDEPENDENT_AMBULATORY_CARE_PROVIDER_SITE_OTHER): Payer: Medicaid Other | Admitting: Allergy & Immunology

## 2022-06-29 VITALS — BP 118/72 | HR 85 | Temp 97.5°F | Resp 18 | Ht 62.0 in | Wt 132.2 lb

## 2022-06-29 DIAGNOSIS — J309 Allergic rhinitis, unspecified: Secondary | ICD-10-CM

## 2022-06-29 DIAGNOSIS — J3089 Other allergic rhinitis: Secondary | ICD-10-CM

## 2022-06-29 DIAGNOSIS — J4530 Mild persistent asthma, uncomplicated: Secondary | ICD-10-CM | POA: Diagnosis not present

## 2022-06-29 DIAGNOSIS — T7800XD Anaphylactic reaction due to unspecified food, subsequent encounter: Secondary | ICD-10-CM

## 2022-06-29 MED ORDER — TACROLIMUS 0.03 % EX OINT: TOPICAL_OINTMENT | Freq: Two times a day (BID) | CUTANEOUS | 3 refills | Status: DC

## 2022-06-29 MED ORDER — LEVOCETIRIZINE DIHYDROCHLORIDE 5 MG PO TABS: 5.0000 mg | ORAL_TABLET | Freq: Two times a day (BID) | ORAL | 11 refills | Status: AC

## 2022-06-29 NOTE — Progress Notes (Signed)
Immunotherapy   Patient Details  Name: Molly Burton MRN: [id number] Date of Birth: 08-09-1996  06/29/2022  Molly Burton started injections for  G-W-T-C-D and RW-Mold-CR-DM. Patient received 0.05 of both her blue vials with an expiration of 06/28/2023. Patient waited 30 minutes with no problems. Following schedule: A  Frequency:1 time per week Epi-Pen:Epi-Pen Available  Consent signed and patient instructions given.   Dub Mikes 06/29/2022, 8:56 AM

## 2022-06-29 NOTE — Patient Instructions (Addendum)
1. Seasonal and perennial allergic rhinitis - Previous testing showed: grasses, ragweed, weeds, trees, indoor molds, outdoor molds, dust mites, cat, dog, and cockroach and mixed feather and mouse - Stop taking: Zyrtec (cetirizine) - Start taking: Xyzal (levocetirizine) 5mg  tablet TWICE daily - You can use an extra dose of the antihistamine, if needed, for breakthrough symptoms.  - Consider nasal saline rinses 1-2 times daily to remove allergens from the nasal cavities as well as help with mucous clearance (this is especially helpful to do before the nasal sprays are given) - Allergy shots started today.   2. 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 - You might have experienced a cross contamination event when you reacted, but it could be a false negative. Training provided. - Anaphylaxis management plan provided.   3. Intermittent asthma, uncomplicated - Lung testing looked decent today. - I think we can continue with the use of albuterol as needed. - We can certainly discuss starting a daily medication to help control your symptoms better, but I want to continue to trend how your albuterol use pans out over the next few months.   4. Return in about 6 months (around 12/30/2022).    Please inform 02/28/2023 of any Emergency Department visits, hospitalizations, or changes in symptoms. Call us before going to the ED for breathing or allergy symptoms since we might be able to fit you in for a sick visit. Feel free to contact us anytime with any questions, problems, or concerns.  It was a pleasure to see you again today!  Websites that have reliable patient information: 1. American Academy of Asthma, Allergy, and Immunology: www.aaaai.org 2. Food Allergy Research and Education (FARE): foodallergy.org 3. Mothers of Asthmatics: http://www.asthmacommunitynetwork.org 4. American College of Allergy, Asthma, and Immunology:  www.acaai.org   COVID-19 Vaccine Information can be found at: [name] For questions related to vaccine distribution or appointments, please email [email or web address] or call [phone].   We realize that you might be concerned about having an allergic reaction to the COVID19 vaccines. To help with that concern, WE ARE OFFERING THE COVID19 VACCINES IN OUR OFFICE! Ask the front desk for dates!     "Like" [phone] on Facebook and Instagram for our latest updates!      A healthy democracy works best when [name] participate! Make sure you are registered to vote! If you have moved or changed any of your contact information, you will need to get this updated before voting!  In some cases, you MAY be able to register to vote online: [employer]

## 2022-06-29 NOTE — Progress Notes (Signed)
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  They were mixed up in the office.  She does have a new diagnosis of asthma.  I think these are all related, so I think allergy shots would be a good addition to her medical management.  At this point, I do not think she needs a controller medication for asthma, but this is something we can revisit at future visits.  Plan/Recommendations:   1. Seasonal and perennial allergic rhinitis - Previous testing showed: grasses, ragweed, weeds, trees, indoor molds, outdoor molds, dust mites, cat, dog, and cockroach and mixed feather and mouse - Stop taking: Zyrtec (cetirizine) - Start taking: Xyzal (levocetirizine) 5mg  tablet TWICE daily - You can use an extra dose of the antihistamine, if needed, for breakthrough symptoms.  - Consider nasal saline rinses 1-2 times daily to remove allergens from the nasal cavities as well as help with mucous clearance (this is especially helpful to do before the nasal sprays are given) - Allergy shots started today.   2. Anaphylactic shock due to food - Previous testing was positive to all shellfish except for lobster. - You might have experienced a cross contamination event when you reacted, but it could be a false negative. - EpiPen is up-to-date. - Anaphylaxis management plan updated to cover for allergy shots as well.   3. Intermittent asthma, uncomplicated - Lung testing looked decent today. - I think we can continue with the use of albuterol as needed. - We can certainly discuss starting a daily medication to help control your symptoms better, but I want to continue to  trend how your albuterol use pans out over the next few months.   4. Return in about 6 months (around 12/30/2022).    Subjective:   Molly Burton is a 26 y.o. female presenting today for follow up of  Chief Complaint  Patient presents with   Allergic Rhinitis     Sneezes several times, stuffy nose, drainage, throat starting to hurt, red itchy eyes, skin is dark and peeling under her eyes.  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  Molly Burton is a 26 y.o. female presenting for a follow up visit.  She was last seen in August 2022.  At that time, she had multiple indoor and outdoor allergens.  We started Zyrtec as well as Flonase and Astelin.  We talked about allergy shots.  She had testing that was positive to all shellfish except for lobster.  We provided her with an Auvi-Q prescription and anaphylaxis management plan.  Since last visit, she decided to start allergy shots.  We did mix her vials. She is now looking for another job. She is now on [employer].   Asthma/Respiratory Symptom History: She does have a history of asthma. She was diagnosed with this in  the spring. She has been through two inhalers since January 2023. She is not sure whether this helps or not. She thinks that the anxiety might be related.  Allergic Rhinitis Symptom History: She feels awful today. She reports that her eyes are irritated and she has developed  darkened skin under her eyes. She has tried using some  over the counter moisturizing. She did get some eye drops that were not helpful. She did not feel that the cetirizine was doing anything. She stopped this several months ago.   Food Allergy Symptom History: She has an up to date EpiPen. She does not eat any shellfish at all.  She used to premedicate with Benadryl before eating shellfish, but she has not done that since she saw me last fall.  Otherwise, there have been no changes to her past medical history, surgical history, family history, or social history.    Review of Systems  Constitutional: Negative.  Negative for chills, fever, malaise/fatigue and weight loss.  HENT:  Positive for congestion. Negative for ear discharge and ear pain.        Positive for postnasal drip.  Eyes:  Negative for pain, discharge and redness.  Respiratory:  Negative for cough, sputum production, shortness of breath and wheezing.   Cardiovascular: Negative.  Negative for chest pain and palpitations.  Gastrointestinal:  Negative for abdominal pain, heartburn, nausea and vomiting.  Skin: Negative.  Negative for itching and rash.  Neurological:  Negative for dizziness and headaches.  Endo/Heme/Allergies:  Positive for environmental allergies. Does not bruise/bleed easily.       Objective:   Blood pressure 118/72, pulse 85, temperature (!) 97.5 F (36.4 C), resp. rate 18, height 5\' 2"  (1.575 m), weight 132 lb 4 oz (60 kg), last menstrual period 06/05/2022, SpO2 98 %, not currently breastfeeding. Body mass index is 24.19 kg/m.    Physical Exam Vitals reviewed.  Constitutional:      Appearance: She is well-developed.     Comments: Very pleasant.  Talkative.  HENT:     Head: Normocephalic and atraumatic.     Right Ear: Tympanic membrane, ear canal and external ear normal. No drainage, swelling or tenderness. Tympanic membrane is not injected, scarred, erythematous, retracted or bulging.     Left  Ear: Tympanic membrane, ear canal and external ear normal. No drainage, swelling or tenderness. Tympanic membrane is not injected, scarred, erythematous, retracted or bulging.     Nose: No nasal deformity, septal deviation, mucosal edema or rhinorrhea.     Right Turbinates: Enlarged, swollen and pale.     Left Turbinates: Enlarged, swollen and pale.     Right Sinus: No maxillary sinus tenderness or frontal sinus tenderness.     Left Sinus: No maxillary sinus tenderness or frontal sinus tenderness.     Comments: No nasal polyps.  Clear rhinorrhea.    Mouth/Throat:     Mouth: Mucous membranes are not pale and not dry.     Pharynx: Uvula midline.  Eyes:     General: Allergic shiner present.        Right eye: No discharge.        Left eye: No discharge.     Conjunctiva/sclera: Conjunctivae normal.     Right eye: Right conjunctiva is not injected. No chemosis.  Left eye: Left conjunctiva is not injected. No chemosis.    Pupils: Pupils are equal, round, and reactive to light.  Cardiovascular:     Rate and Rhythm: Normal rate and regular rhythm.     Heart sounds: Normal heart sounds.  Pulmonary:     Effort: Pulmonary effort is normal. No tachypnea, accessory muscle usage or respiratory distress.     Breath sounds: Normal breath sounds. No wheezing, rhonchi or rales.     Comments: Moving air well in all lung fields.  No increased work of breathing. Chest:     Chest wall: No tenderness.  Lymphadenopathy:     Head:     Right side of head: No submandibular, tonsillar or occipital adenopathy.     Left side of head: No submandibular, tonsillar or occipital adenopathy.     Cervical: No cervical adenopathy.  Skin:    Coloration: Skin is not pale.     Findings: No abrasion, erythema, petechiae or rash. Rash is not papular, urticarial or vesicular.  Neurological:     Mental Status: She is alert.  Psychiatric:        Behavior: Behavior is cooperative.      Diagnostic studies:     Spirometry: results normal (FEV1: 2.05/78%, FVC: 3.36/111%, FEV1/FVC: 61%).    Spirometry consistent with mild obstructive disease.   Allergy Studies: none        Malachi Bonds, MD  [facility]

## 2022-07-07 ENCOUNTER — Telehealth: Payer: Self-pay

## 2022-07-07 MED ORDER — TACROLIMUS 0.03 % EX OINT: TOPICAL_OINTMENT | Freq: Two times a day (BID) | CUTANEOUS | 3 refills | Status: DC

## 2022-07-07 NOTE — Telephone Encounter (Signed)
Tacrolimus (Protopic) 0.03% ointment PA....  KEYTeodora Medici - PA Case ID: [id number] created: 06/29/22  Approved: 06/29/22 - 07/01/23  Resent Tacrolimus 0.03% ointment to pharmacy.

## 2022-07-08 ENCOUNTER — Ambulatory Visit (INDEPENDENT_AMBULATORY_CARE_PROVIDER_SITE_OTHER): Payer: Medicaid Other

## 2022-07-08 DIAGNOSIS — J309 Allergic rhinitis, unspecified: Secondary | ICD-10-CM | POA: Diagnosis not present

## 2022-07-13 ENCOUNTER — Ambulatory Visit (INDEPENDENT_AMBULATORY_CARE_PROVIDER_SITE_OTHER): Payer: Medicaid Other

## 2022-07-13 ENCOUNTER — Ambulatory Visit: Payer: Medicaid Other

## 2022-07-13 DIAGNOSIS — J309 Allergic rhinitis, unspecified: Secondary | ICD-10-CM

## 2022-07-18 ENCOUNTER — Other Ambulatory Visit: Payer: Self-pay

## 2022-07-18 DIAGNOSIS — E0590 Thyrotoxicosis, unspecified without thyrotoxic crisis or storm: Secondary | ICD-10-CM

## 2022-07-19 ENCOUNTER — Telehealth: Payer: No Typology Code available for payment source | Admitting: Nurse Practitioner

## 2022-07-19 DIAGNOSIS — E0590 Thyrotoxicosis, unspecified without thyrotoxic crisis or storm: Secondary | ICD-10-CM

## 2022-07-20 ENCOUNTER — Ambulatory Visit (INDEPENDENT_AMBULATORY_CARE_PROVIDER_SITE_OTHER): Payer: Medicaid Other

## 2022-07-20 DIAGNOSIS — J309 Allergic rhinitis, unspecified: Secondary | ICD-10-CM

## 2022-07-27 ENCOUNTER — Ambulatory Visit (INDEPENDENT_AMBULATORY_CARE_PROVIDER_SITE_OTHER): Payer: Medicaid Other

## 2022-07-27 DIAGNOSIS — J309 Allergic rhinitis, unspecified: Secondary | ICD-10-CM | POA: Diagnosis not present

## 2022-08-03 ENCOUNTER — Ambulatory Visit (INDEPENDENT_AMBULATORY_CARE_PROVIDER_SITE_OTHER): Payer: Medicaid Other

## 2022-08-03 DIAGNOSIS — J309 Allergic rhinitis, unspecified: Secondary | ICD-10-CM

## 2022-08-10 ENCOUNTER — Encounter: Payer: Self-pay | Admitting: Advanced Practice Midwife

## 2022-08-10 ENCOUNTER — Other Ambulatory Visit (HOSPITAL_COMMUNITY)
Admission: RE | Admit: 2022-08-10 | Discharge: 2022-08-10 | Disposition: A | Discharge status: Home / Self Care | Payer: Medicaid Other | Source: Ambulatory Visit | Attending: Advanced Practice Midwife | Admitting: Advanced Practice Midwife

## 2022-08-10 ENCOUNTER — Ambulatory Visit: Payer: Medicaid Other | Admitting: Advanced Practice Midwife

## 2022-08-10 VITALS — BP 112/69 | HR 53 | Ht 62.0 in | Wt 137.0 lb

## 2022-08-10 DIAGNOSIS — Z3202 Encounter for pregnancy test, result negative: Secondary | ICD-10-CM | POA: Diagnosis not present

## 2022-08-10 DIAGNOSIS — Z30430 Encounter for insertion of intrauterine contraceptive device: Secondary | ICD-10-CM | POA: Diagnosis not present

## 2022-08-10 DIAGNOSIS — Z124 Encounter for screening for malignant neoplasm of cervix: Secondary | ICD-10-CM

## 2022-08-10 DIAGNOSIS — Z01419 Encounter for gynecological examination (general) (routine) without abnormal findings: Secondary | ICD-10-CM

## 2022-08-10 LAB — POCT URINE PREGNANCY: Preg Test, Ur: NEGATIVE

## 2022-08-10 MED ORDER — LEVONORGESTREL 20 MCG/DAY IU IUD
1.0000 | INTRAUTERINE_SYSTEM | Freq: Once | INTRAUTERINE | Status: AC
  Administered 2022-08-10: 1 via INTRAUTERINE

## 2022-08-10 NOTE — Patient Instructions (Signed)
Nothing in vagina for 3 days (no sex, douching, tampons, etc...) Check your strings once a month to make sure you can feel them, if you are not able to please let us know If you develop a fever of 100.4 or more in the next few weeks, or if you develop severe abdominal pain, please let us know Use a backup method of birth control, such as condoms, for 2 weeks  

## 2022-08-10 NOTE — Progress Notes (Signed)
   IUD INSERTION Patient name: Molly Burton MRN [id number]  Date of birth: [birth year] Subjective Findings:   Molly Burton is a 26 y.o. G40P2003 African American female being seen today for insertion of a Mirena IUD.     12/13/2021    8:39 AM 07/23/2021   11:23 AM 07/23/2021   11:21 AM 06/21/2021    1:09 PM 09/16/2020    8:58 AM  Depression screen PHQ 2/9  Decreased Interest 0 0 0 0 3  Down, Depressed, Hopeless 0 0 0 0 0  PHQ - 2 Score 0 0 0 0 3  Altered sleeping     0  Tired, decreased energy     3  Change in appetite     0  Feeling bad or failure about yourself      0  Trouble concentrating     0  Moving slowly or fidgety/restless     0  Suicidal thoughts     0  PHQ-9 Score     6    Patient's last menstrual period was 07/29/2022. Last sexual intercourse was >7yr ago Last pap Oct 2019. Results were:  normal (collected today)  The risks and benefits of the method and placement have been thouroughly reviewed with the patient and all questions were answered.  Specifically the patient is aware of failure rate of 11/998, expulsion of the IUD and of possible perforation.  The patient is aware of irregular bleeding due to the method and understands the incidence of irregular bleeding diminishes with time.  Signed copy of informed consent in chart.  Pertinent History Reviewed:   Reviewed past medical,surgical, social, obstetrical and family history.  Reviewed problem list, medications and allergies. Objective Findings & Procedure:   Vitals:   08/10/22 1115  BP: 112/69  Pulse: (!) 53  Weight: 137 lb (62.1 kg)  Height: 5\' 2"  (1.575 m)  Body mass index is 25.06 kg/m.  Results for orders placed or performed in visit on 08/10/22 (from the past 24 hour(s))  POCT urine pregnancy   Collection Time: 08/10/22 11:52 AM  Result Value Ref Range   Preg Test, Ur Negative Negative     Time out was performed.  A Graves speculum was placed in the vagina.  The cervix was visualized,  prepped using Betadine, and grasped with a single tooth tenaculum. The uterus was found to be retroflexed and it sounded to 7 cm.  Mirena  IUD placed per manufacturer's recommendations. The strings were trimmed to approximately 3 cm. The patient tolerated the procedure well.   Informal transvaginal sonogram was performed and the proper placement of the IUD was verified (by 08/12/22)  Chaperone: Mathews Argyle   Assessment & Plan:   1) Mirena IUD insertion The patient was given post procedure instructions, including signs and symptoms of infection and to check for the strings after each menses or each month, and refraining from intercourse or anything in the vagina for 3 days. She was given a care card with date IUD placed, and date IUD to be removed. She is scheduled for a f/u appointment in 4 weeks.  2) Due for Pap screening, collected today  Orders Placed This Encounter  Procedures   POCT urine pregnancy    Return in about 4 weeks (around 09/07/2022) for IUD f/u.  11/07/2022 CNM 08/10/2022 12:09 PM

## 2022-08-12 ENCOUNTER — Ambulatory Visit (INDEPENDENT_AMBULATORY_CARE_PROVIDER_SITE_OTHER): Payer: Medicaid Other

## 2022-08-12 DIAGNOSIS — J309 Allergic rhinitis, unspecified: Secondary | ICD-10-CM | POA: Diagnosis not present

## 2022-08-15 LAB — CYTOLOGY - PAP
Adequacy: ABSENT
Comment: NEGATIVE
Diagnosis: NEGATIVE
High risk HPV: NEGATIVE

## 2022-08-17 ENCOUNTER — Ambulatory Visit (INDEPENDENT_AMBULATORY_CARE_PROVIDER_SITE_OTHER): Payer: Medicaid Other

## 2022-08-17 DIAGNOSIS — J309 Allergic rhinitis, unspecified: Secondary | ICD-10-CM | POA: Diagnosis not present

## 2022-08-26 ENCOUNTER — Ambulatory Visit (INDEPENDENT_AMBULATORY_CARE_PROVIDER_SITE_OTHER): Payer: Medicaid Other

## 2022-08-26 DIAGNOSIS — J309 Allergic rhinitis, unspecified: Secondary | ICD-10-CM | POA: Diagnosis not present

## 2022-09-07 ENCOUNTER — Ambulatory Visit (INDEPENDENT_AMBULATORY_CARE_PROVIDER_SITE_OTHER): Payer: Medicaid Other | Admitting: Advanced Practice Midwife

## 2022-09-07 ENCOUNTER — Encounter: Payer: Self-pay | Admitting: Advanced Practice Midwife

## 2022-09-07 VITALS — BP 117/74 | HR 54 | Ht 62.0 in | Wt 137.0 lb

## 2022-09-07 DIAGNOSIS — Z30431 Encounter for routine checking of intrauterine contraceptive device: Secondary | ICD-10-CM

## 2022-09-07 NOTE — Progress Notes (Signed)
   GYN VISIT Patient name: Molly Burton MRN [id number]  Date of birth: 01-May-1996 Chief Complaint:   Follow-up  History of Present Illness:   Molly Burton is a 26 y.o. G51P2003 African-American female being seen today for Mirena 1 month check.    Patient's last menstrual period was 07/29/2022. The current method of family planning is IUD.  Last pap Sept 2013. Results were: NILM w/ HRHPV negative     12/13/2021    8:39 AM 07/23/2021   11:23 AM 07/23/2021   11:21 AM 06/21/2021    1:09 PM 09/16/2020    8:58 AM  Depression screen PHQ 2/9  Decreased Interest 0 0 0 0 3  Down, Depressed, Hopeless 0 0 0 0 0  PHQ - 2 Score 0 0 0 0 3  Altered sleeping     0  Tired, decreased energy     3  Change in appetite     0  Feeling bad or failure about yourself      0  Trouble concentrating     0  Moving slowly or fidgety/restless     0  Suicidal thoughts     0  PHQ-9 Score     6        09/16/2020    8:59 AM  GAD 7 : Generalized Anxiety Score  Nervous, Anxious, on Edge 0  Control/stop worrying 0  Worry too much - different things 0  Trouble relaxing 0  Restless 0  Easily annoyed or irritable 0  Afraid - awful might happen 0  Total GAD 7 Score 0     Review of Systems:   Pertinent items are noted in HPI Denies fever/chills, dizziness, headaches, visual disturbances, fatigue, shortness of breath, chest pain, abdominal pain, vomiting, abnormal vaginal discharge/itching/odor/irritation, problems with periods, bowel movements, urination, or intercourse unless otherwise stated above.  Pertinent History Reviewed:  Reviewed past medical,surgical, social, obstetrical and family history.  Reviewed problem list, medications and allergies. Physical Assessment:   Vitals:   09/07/22 1504  BP: 117/74  Pulse: (!) 54  Weight: 137 lb (62.1 kg)  Height: 5\' 2"  (1.575 m)  Body mass index is 25.06 kg/m.       Physical Examination:   General appearance: alert, well appearing, and in no  distress  Mental status: alert, oriented to person, place, and time  Skin: warm & dry   Cardiovascular: normal heart rate noted  Respiratory: normal respiratory effort, no distress  Abdomen: soft, non-tender   Pelvic: normal external genitalia, vulva, vagina, cervix, uterus and adnexa; Mirena strings ~3cm from os; mucousy brown d/c noted  Extremities: no edema    No results found for this or any previous visit (from the past 24 hour(s)).  Assessment & Plan:  1) Mirena check up> doing well; strings in place   Meds: No orders of the defined types were placed in this encounter.   No orders of the defined types were placed in this encounter.   Return in about 1 year (around 09/08/2023) for Physical.  Myrtis Ser [facility] 09/07/2022 3:26 PM

## 2022-09-09 ENCOUNTER — Ambulatory Visit (INDEPENDENT_AMBULATORY_CARE_PROVIDER_SITE_OTHER): Payer: Medicaid Other

## 2022-09-09 DIAGNOSIS — J309 Allergic rhinitis, unspecified: Secondary | ICD-10-CM

## 2022-09-20 ENCOUNTER — Emergency Department (HOSPITAL_COMMUNITY): Payer: Medicaid Other

## 2022-09-20 ENCOUNTER — Encounter: Payer: Self-pay | Admitting: Allergy & Immunology

## 2022-09-20 ENCOUNTER — Encounter (HOSPITAL_COMMUNITY): Payer: Self-pay

## 2022-09-20 ENCOUNTER — Emergency Department (HOSPITAL_COMMUNITY)
Admission: EM | Admit: 2022-09-20 | Discharge: 2022-09-20 | Discharge status: Against Medical Advice | Payer: Medicaid Other | Attending: Emergency Medicine | Admitting: Emergency Medicine

## 2022-09-20 ENCOUNTER — Other Ambulatory Visit: Payer: Self-pay

## 2022-09-20 DIAGNOSIS — Z5321 Procedure and treatment not carried out due to patient leaving prior to being seen by health care provider: Secondary | ICD-10-CM | POA: Diagnosis not present

## 2022-09-20 DIAGNOSIS — R0789 Other chest pain: Secondary | ICD-10-CM | POA: Diagnosis not present

## 2022-09-20 DIAGNOSIS — Z20822 Contact with and (suspected) exposure to covid-19: Secondary | ICD-10-CM | POA: Diagnosis not present

## 2022-09-20 DIAGNOSIS — R059 Cough, unspecified: Secondary | ICD-10-CM | POA: Diagnosis not present

## 2022-09-20 DIAGNOSIS — J45909 Unspecified asthma, uncomplicated: Secondary | ICD-10-CM | POA: Diagnosis not present

## 2022-09-20 DIAGNOSIS — R0602 Shortness of breath: Secondary | ICD-10-CM | POA: Diagnosis not present

## 2022-09-20 DIAGNOSIS — R0981 Nasal congestion: Secondary | ICD-10-CM | POA: Diagnosis not present

## 2022-09-20 HISTORY — DX: Unspecified asthma, uncomplicated: J45.909

## 2022-09-20 LAB — RESP PANEL BY RT-PCR (FLU A&B, COVID) ARPGX2
Influenza A by PCR: NEGATIVE
Influenza B by PCR: NEGATIVE
SARS Coronavirus 2 by RT PCR: NEGATIVE

## 2022-09-20 MED ORDER — ALBUTEROL SULFATE HFA 108 (90 BASE) MCG/ACT IN AERS
2.0000 | INHALATION_SPRAY | RESPIRATORY_TRACT | Status: DC | PRN
  Administered 2022-09-20: 2 via RESPIRATORY_TRACT
  Filled 2022-09-20: qty 6.7

## 2022-09-20 MED ORDER — PREDNISONE 20 MG PO TABS
60.0000 mg | ORAL_TABLET | Freq: Once | ORAL | Status: AC
  Administered 2022-09-20: 60 mg via ORAL
  Filled 2022-09-20: qty 3

## 2022-09-20 NOTE — ED Triage Notes (Signed)
Patient c/o a productive cough with yellow sputum. Paitent states that she has a history of asthma and sttes she is out of her Albuterol inhaler.

## 2022-09-20 NOTE — ED Provider Triage Note (Signed)
Emergency Medicine Provider Triage Evaluation Note  Molly Burton , a 26 y.o. female  was evaluated in triage.  Pt complains of cough, shortness of breath and chest tightness.  History of asthma.  Has diffuse inspiratory and expiratory wheeze.  Speaks without distress.  No lower extremity swelling, pain. Some congestion, rhinorrhea  Review of Systems  Positive: Sob, cough, wheeze Negative: fever  Physical Exam  BP (!) 152/110 (BP Location: Left Arm)   Pulse 63   Temp 99.3 F (37.4 C) (Oral)   Resp 16   Ht 5\' 2"  (1.575 m)   Wt 62.1 kg   LMP 09/20/2022   SpO2 93%   BMI 25.06 kg/m  Gen:   Awake, no distress   Resp:  Inspiratory and expiratory wheeze MSK:   Moves extremities without difficulty  Other:    Medical Decision Making  Medically screening exam initiated at 11:34 AM.  Appropriate orders placed.  Molly Burton was informed that the remainder of the evaluation will be completed by another provider, this initial triage assessment does not replace that evaluation, and the importance of remaining in the ED until their evaluation is complete.  Wheeze   Talyah Seder A, PA-C 09/20/22 1136

## 2022-09-21 DIAGNOSIS — J4521 Mild intermittent asthma with (acute) exacerbation: Secondary | ICD-10-CM | POA: Diagnosis not present

## 2022-09-21 DIAGNOSIS — R051 Acute cough: Secondary | ICD-10-CM | POA: Diagnosis not present

## 2022-10-28 ENCOUNTER — Ambulatory Visit (INDEPENDENT_AMBULATORY_CARE_PROVIDER_SITE_OTHER): Payer: Medicaid Other

## 2022-10-28 DIAGNOSIS — J309 Allergic rhinitis, unspecified: Secondary | ICD-10-CM | POA: Diagnosis not present

## 2022-11-04 ENCOUNTER — Other Ambulatory Visit: Payer: Self-pay

## 2022-11-04 DIAGNOSIS — R748 Abnormal levels of other serum enzymes: Secondary | ICD-10-CM

## 2022-11-04 DIAGNOSIS — R7989 Other specified abnormal findings of blood chemistry: Secondary | ICD-10-CM

## 2022-11-30 ENCOUNTER — Ambulatory Visit (INDEPENDENT_AMBULATORY_CARE_PROVIDER_SITE_OTHER): Payer: Medicaid Other

## 2022-11-30 DIAGNOSIS — J309 Allergic rhinitis, unspecified: Secondary | ICD-10-CM | POA: Diagnosis not present

## 2022-12-07 ENCOUNTER — Ambulatory Visit (INDEPENDENT_AMBULATORY_CARE_PROVIDER_SITE_OTHER): Payer: Medicaid Other

## 2022-12-07 DIAGNOSIS — J309 Allergic rhinitis, unspecified: Secondary | ICD-10-CM | POA: Diagnosis not present

## 2023-01-13 ENCOUNTER — Ambulatory Visit (INDEPENDENT_AMBULATORY_CARE_PROVIDER_SITE_OTHER): Payer: Medicaid Other

## 2023-01-13 DIAGNOSIS — J309 Allergic rhinitis, unspecified: Secondary | ICD-10-CM

## 2023-02-10 ENCOUNTER — Other Ambulatory Visit: Payer: Self-pay

## 2023-02-10 MED ORDER — EPINEPHRINE 0.3 MG/0.3ML IJ SOAJ: 0.3000 mg | INTRAMUSCULAR | 2 refills | Status: DC | PRN

## 2023-03-09 DIAGNOSIS — F53 Postpartum depression: Secondary | ICD-10-CM | POA: Diagnosis not present

## 2023-03-09 DIAGNOSIS — R5383 Other fatigue: Secondary | ICD-10-CM | POA: Diagnosis not present

## 2023-03-09 DIAGNOSIS — E0590 Thyrotoxicosis, unspecified without thyrotoxic crisis or storm: Secondary | ICD-10-CM | POA: Diagnosis not present

## 2023-03-09 DIAGNOSIS — Z6823 Body mass index (BMI) 23.0-23.9, adult: Secondary | ICD-10-CM | POA: Diagnosis not present

## 2023-03-22 ENCOUNTER — Ambulatory Visit (INDEPENDENT_AMBULATORY_CARE_PROVIDER_SITE_OTHER): Payer: Medicaid Other

## 2023-03-22 DIAGNOSIS — J309 Allergic rhinitis, unspecified: Secondary | ICD-10-CM | POA: Diagnosis not present

## 2023-06-09 ENCOUNTER — Ambulatory Visit (INDEPENDENT_AMBULATORY_CARE_PROVIDER_SITE_OTHER): Payer: Managed Care, Other (non HMO)

## 2023-06-09 DIAGNOSIS — J309 Allergic rhinitis, unspecified: Secondary | ICD-10-CM

## 2023-06-29 DIAGNOSIS — J3081 Allergic rhinitis due to animal (cat) (dog) hair and dander: Secondary | ICD-10-CM | POA: Diagnosis not present

## 2023-06-29 NOTE — Progress Notes (Signed)
VIALS EXP 06-28-24

## 2023-06-30 DIAGNOSIS — J3089 Other allergic rhinitis: Secondary | ICD-10-CM | POA: Diagnosis not present

## 2023-07-18 ENCOUNTER — Ambulatory Visit: Payer: Managed Care, Other (non HMO) | Admitting: Women's Health

## 2023-07-18 ENCOUNTER — Other Ambulatory Visit (HOSPITAL_COMMUNITY)
Admission: RE | Admit: 2023-07-18 | Discharge: 2023-07-18 | Disposition: A | Discharge status: Home / Self Care | Payer: Managed Care, Other (non HMO) | Source: Ambulatory Visit | Attending: Women's Health | Admitting: Women's Health

## 2023-07-18 ENCOUNTER — Encounter: Payer: Self-pay | Admitting: Women's Health

## 2023-07-18 VITALS — BP 144/92 | HR 66 | Ht 62.0 in | Wt 133.8 lb

## 2023-07-18 DIAGNOSIS — R300 Dysuria: Secondary | ICD-10-CM | POA: Diagnosis not present

## 2023-07-18 DIAGNOSIS — Z113 Encounter for screening for infections with a predominantly sexual mode of transmission: Secondary | ICD-10-CM | POA: Insufficient documentation

## 2023-07-18 DIAGNOSIS — R030 Elevated blood-pressure reading, without diagnosis of hypertension: Secondary | ICD-10-CM | POA: Diagnosis not present

## 2023-07-18 DIAGNOSIS — N898 Other specified noninflammatory disorders of vagina: Secondary | ICD-10-CM

## 2023-07-18 LAB — POCT URINALYSIS DIPSTICK OB
Glucose, UA: NEGATIVE
Ketones, UA: NEGATIVE
Nitrite, UA: NEGATIVE
POC,PROTEIN,UA: NEGATIVE

## 2023-07-18 NOTE — Progress Notes (Signed)
GYN VISIT Patient name: Molly Burton MRN [id number]  Date of birth: 04-07-1996 Chief Complaint:   Gynecologic Exam (Vaginal itching, burning with urination, spotting)  History of Present Illness:   Molly Burton is a 27 y.o. G41P2003 African-American female being seen today for report of sex ~3wks ago for 1st time in a few years.  Immediately after had burning w/ urination, but reports could be from using Darene Lamer, went away pretty quickly. Doesn't really feel like she has uti. Then began to have vaginal itching/irritation, some bleeding. All sx have gone away, but still has an odor. Wants STD screening (not HIV, RPR).  BP elevated, had pre-e & PPHTN w/ last pregnancy, states bp's have been good at PCP  No LMP recorded (lmp unknown). (Menstrual status: IUD). The current method of family planning is IUD. Mirena inserted 08/10/22 Last pap 08/10/22. Results were: NILM w/ HRHPV negative     12/13/2021    8:39 AM 07/23/2021   11:23 AM 07/23/2021   11:21 AM 06/21/2021    1:09 PM 09/16/2020    8:58 AM  Depression screen PHQ 2/9  Decreased Interest 0 0 0 0 3  Down, Depressed, Hopeless 0 0 0 0 0  PHQ - 2 Score 0 0 0 0 3  Altered sleeping     0  Tired, decreased energy     3  Change in appetite     0  Feeling bad or failure about yourself      0  Trouble concentrating     0  Moving slowly or fidgety/restless     0  Suicidal thoughts     0  PHQ-9 Score     6        09/16/2020    8:59 AM  GAD 7 : Generalized Anxiety Score  Nervous, Anxious, on Edge 0  Control/stop worrying 0  Worry too much - different things 0  Trouble relaxing 0  Restless 0  Easily annoyed or irritable 0  Afraid - awful might happen 0  Total GAD 7 Score 0     Review of Systems:   Pertinent items are noted in HPI Denies fever/chills, dizziness, headaches, visual disturbances, fatigue, shortness of breath, chest pain, abdominal pain, vomiting, abnormal vaginal discharge/itching/odor/irritation, problems with  periods, bowel movements, urination, or intercourse unless otherwise stated above.  Pertinent History Reviewed:  Reviewed past medical,surgical, social, obstetrical and family history.  Reviewed problem list, medications and allergies. Physical Assessment:   Vitals:   07/18/23 1610  BP: (!) 144/92  Pulse: 66  Weight: 133 lb 12.8 oz (60.7 kg)  Height: 5\' 2"  (1.575 m)  Body mass index is 24.47 kg/m.       Physical Examination:   General appearance: alert, well appearing, and in no distress  Mental status: alert, oriented to person, place, and time  Skin: warm & dry   Cardiovascular: normal heart rate noted  Respiratory: normal respiratory effort, no distress  Abdomen: soft, non-tender   Pelvic: VULVA: normal appearing vulva with no masses, tenderness or lesions, VAGINA: normal appearing vagina with normal color and discharge, no lesions, CERVIX: normal appearing cervix without discharge or lesions, IUD strings visible UTERUS: uterus is normal size, shape, consistency and nontender  Extremities: no edema   Chaperone:  Freddie Apley     Results for orders placed or performed in visit on 07/18/23 (from the past 24 hour(s))  POC Urinalysis Dipstick OB   Collection Time: 07/18/23  4:14 PM  Result Value  Ref Range   Color, UA     Clarity, UA     Glucose, UA Negative Negative   Bilirubin, UA     Ketones, UA Negative    Spec Grav, UA     Blood, UA Trace-intact    pH, UA     POC,PROTEIN,UA Negative Negative, Trace, Small (1+), Moderate (2+), Large (3+), 4+   Urobilinogen, UA     Nitrite, UA Negative    Leukocytes, UA Small (1+) (A) Negative   Appearance     Odor      Assessment & Plan:  1) Vaginal odor> dysuria & vaginal itching/irritation has resolved, CV swab  2) STD screen> CV swab  3) Elevated bp w/ h/o pre-e/ppHTN> to notify PCP and see if needs visit  Meds: No orders of the defined types were placed in this encounter.   Orders Placed This Encounter  Procedures    POC Urinalysis Dipstick OB    Return in about 1 year (around 07/17/2024) for Physical.  Cheral Marker CNM, [facility] 07/18/2023 4:38 PM

## 2023-07-20 ENCOUNTER — Telehealth: Payer: Self-pay | Admitting: *Deleted

## 2023-07-20 ENCOUNTER — Encounter: Payer: Self-pay | Admitting: Women's Health

## 2023-07-20 LAB — CERVICOVAGINAL ANCILLARY ONLY
Bacterial Vaginitis (gardnerella): POSITIVE — AB
Candida Glabrata: NEGATIVE
Candida Vaginitis: POSITIVE — AB
Chlamydia: NEGATIVE
Comment: NEGATIVE
Comment: NEGATIVE
Comment: NEGATIVE
Comment: NEGATIVE
Comment: NEGATIVE
Comment: NORMAL
Neisseria Gonorrhea: NEGATIVE
Trichomonas: NEGATIVE

## 2023-07-20 MED ORDER — METRONIDAZOLE 500 MG PO TABS: 500.0000 mg | ORAL_TABLET | Freq: Two times a day (BID) | ORAL | 0 refills | Status: DC

## 2023-07-20 MED ORDER — FLUCONAZOLE 150 MG PO TABS: ORAL_TABLET | ORAL | 1 refills | Status: AC

## 2023-07-20 NOTE — Telephone Encounter (Signed)
Patient called requesting prescription be sent in today if possible for positive BV on recent swab.  Advised to check back with pharmacy later this afternoon.

## 2023-07-20 NOTE — Telephone Encounter (Signed)
Rx sent in for flagyl and diflucan had BV and yeast on vaginal swab, no sex or alcohol while taking

## 2023-07-20 NOTE — Addendum Note (Signed)
Addended by: Cyril Mourning A on: 07/20/2023 04:57 PM   Modules accepted: Orders

## 2023-08-02 ENCOUNTER — Ambulatory Visit (INDEPENDENT_AMBULATORY_CARE_PROVIDER_SITE_OTHER): Payer: Managed Care, Other (non HMO)

## 2023-08-02 DIAGNOSIS — J309 Allergic rhinitis, unspecified: Secondary | ICD-10-CM

## 2023-08-30 ENCOUNTER — Ambulatory Visit (INDEPENDENT_AMBULATORY_CARE_PROVIDER_SITE_OTHER): Payer: Self-pay

## 2023-08-30 DIAGNOSIS — J309 Allergic rhinitis, unspecified: Secondary | ICD-10-CM

## 2023-09-21 ENCOUNTER — Emergency Department (HOSPITAL_COMMUNITY)
Admission: EM | Admit: 2023-09-21 | Discharge: 2023-09-21 | Disposition: A | Discharge status: Home / Self Care | Payer: Managed Care, Other (non HMO) | Attending: Emergency Medicine | Admitting: Emergency Medicine

## 2023-09-21 ENCOUNTER — Other Ambulatory Visit: Payer: Self-pay

## 2023-09-21 ENCOUNTER — Encounter (HOSPITAL_COMMUNITY): Payer: Self-pay | Admitting: *Deleted

## 2023-09-21 DIAGNOSIS — R110 Nausea: Secondary | ICD-10-CM | POA: Insufficient documentation

## 2023-09-21 DIAGNOSIS — E860 Dehydration: Secondary | ICD-10-CM | POA: Insufficient documentation

## 2023-09-21 LAB — COMPREHENSIVE METABOLIC PANEL
ALT: 26 U/L (ref 0–44)
AST: 19 U/L (ref 15–41)
Albumin: 4.4 g/dL (ref 3.5–5.0)
Alkaline Phosphatase: 49 U/L (ref 38–126)
Anion gap: 10 (ref 5–15)
BUN: 12 mg/dL (ref 6–20)
CO2: 23 mmol/L (ref 22–32)
Calcium: 8.8 mg/dL — ABNORMAL LOW (ref 8.9–10.3)
Chloride: 101 mmol/L (ref 98–111)
Creatinine, Ser: 0.81 mg/dL (ref 0.44–1.00)
GFR, Estimated: >60 mL/min (ref >60)
Glucose, Bld: 84 mg/dL (ref 70–99)
Potassium: 3.1 mmol/L — ABNORMAL LOW (ref 3.5–5.1)
Sodium: 134 mmol/L — ABNORMAL LOW (ref 135–145)
Total Bilirubin: 1.7 mg/dL — ABNORMAL HIGH (ref 0.3–1.2)
Total Protein: 8 g/dL (ref 6.5–8.1)

## 2023-09-21 LAB — PREGNANCY, URINE: Preg Test, Ur: NEGATIVE

## 2023-09-21 LAB — URINALYSIS, ROUTINE W REFLEX MICROSCOPIC
Bilirubin Urine: NEGATIVE
Glucose, UA: NEGATIVE mg/dL
Hgb urine dipstick: NEGATIVE
Ketones, ur: 20 mg/dL — AB
Leukocytes,Ua: NEGATIVE
Nitrite: NEGATIVE
Protein, ur: 100 mg/dL — AB
Specific Gravity, Urine: 1.032 — ABNORMAL HIGH (ref 1.005–1.030)
pH: 5 (ref 5.0–8.0)

## 2023-09-21 LAB — CBC WITH DIFFERENTIAL/PLATELET
Abs Immature Granulocytes: 0.03 10*3/uL (ref 0.00–0.07)
Basophils Absolute: 0 10*3/uL (ref 0.0–0.1)
Basophils Relative: 0 %
Eosinophils Absolute: 0.1 10*3/uL (ref 0.0–0.5)
Eosinophils Relative: 1 %
HCT: 41.3 % (ref 36.0–46.0)
Hemoglobin: 14 g/dL (ref 12.0–15.0)
Immature Granulocytes: 0 %
Lymphocytes Relative: 10 %
Lymphs Abs: 0.9 10*3/uL (ref 0.7–4.0)
MCH: 30.2 pg (ref 26.0–34.0)
MCHC: 33.9 g/dL (ref 30.0–36.0)
MCV: 89 fL (ref 80.0–100.0)
Monocytes Absolute: 0.6 10*3/uL (ref 0.1–1.0)
Monocytes Relative: 6 %
Neutro Abs: 8 10*3/uL — ABNORMAL HIGH (ref 1.7–7.7)
Neutrophils Relative %: 83 %
Platelets: 186 10*3/uL (ref 150–400)
RBC: 4.64 MIL/uL (ref 3.87–5.11)
RDW: 12.3 % (ref 11.5–15.5)
WBC: 9.7 10*3/uL (ref 4.0–10.5)
nRBC: 0 % (ref 0.0–0.2)

## 2023-09-21 LAB — LIPASE, BLOOD: Lipase: 19 U/L (ref 11–51)

## 2023-09-21 MED ORDER — ONDANSETRON HCL 4 MG/2ML IJ SOLN
4.0000 mg | Freq: Once | INTRAMUSCULAR | Status: AC
  Administered 2023-09-21: 4 mg via INTRAVENOUS
  Filled 2023-09-21: qty 2

## 2023-09-21 MED ORDER — ONDANSETRON 4 MG PO TBDP: 4.0000 mg | ORAL_TABLET | Freq: Four times a day (QID) | ORAL | 0 refills | Status: AC | PRN

## 2023-09-21 MED ORDER — KETOROLAC TROMETHAMINE 15 MG/ML IJ SOLN
15.0000 mg | Freq: Once | INTRAMUSCULAR | Status: AC
  Administered 2023-09-21: 15 mg via INTRAVENOUS
  Filled 2023-09-21: qty 1

## 2023-09-21 MED ORDER — POTASSIUM CHLORIDE CRYS ER 20 MEQ PO TBCR
40.0000 meq | EXTENDED_RELEASE_TABLET | Freq: Once | ORAL | Status: AC
  Administered 2023-09-21: 40 meq via ORAL
  Filled 2023-09-21: qty 2

## 2023-09-21 NOTE — ED Provider Notes (Signed)
[place] EMERGENCY DEPARTMENT AT [facility] Provider Note   CSN: [id number] Arrival date & time: 09/21/23  4010     History  Chief Complaint  Patient presents with   Nausea    Molly Burton is a 27 y.o. female.  She has a significant past medical history.  Only past surgical history is C-section x 1  Presents today for nausea, dizziness, mild headache, feeling dehydrated.  She states that 4 days ago she started having nausea vomiting and diarrhea that lasted the whole day.  Since then she has had nausea and soft stools and has been just feeling generally unwell, she has not really been able to eat or drink since this occurred and feels she is dehydrated.  She has no chest pain, no abdominal pain, no shortness of breath, no syncope or palpitations.  No sick contacts.  No urinary symptoms.  Denies chance of pregnancy.  HPI     Home Medications Prior to Admission medications   Medication Sig Start Date End Date Taking? Authorizing Provider  ondansetron (ZOFRAN-ODT) 4 MG disintegrating tablet Take 1 tablet (4 mg total) by mouth every 6 (six) hours as needed for nausea or vomiting. 09/21/23  Yes Sacred Roa A, PA-C  EPINEPHrine 0.3 mg/0.3 mL IJ SOAJ injection Inject 0.3 mg into the muscle as needed for anaphylaxis. 02/10/23   Alfonse Spruce, MD  fluconazole (DIFLUCAN) 150 MG tablet Take 1 now and 1 in 3 days 07/20/23   Adline Potter, NP  levocetirizine (XYZAL) 5 MG tablet Take 1 tablet (5 mg total) by mouth in the morning and at bedtime. 06/29/22 07/29/22  Alfonse Spruce, MD  metroNIDAZOLE (FLAGYL) 500 MG tablet Take 1 tablet (500 mg total) by mouth 2 (two) times daily. 07/20/23   Adline Potter, NP      Allergies    Patient has no known allergies.    Review of Systems   Review of Systems  Physical Exam Updated Vital Signs BP 117/84 (BP Location: Right Arm)   Pulse 80   Temp 98.5 F (36.9 C) (Oral)   Resp 14   Ht 5\' 2"  (1.575 m)   Wt 59  kg   SpO2 98%   BMI 23.78 kg/m  Physical Exam Vitals and nursing note reviewed.  Constitutional:      General: She is not in acute distress.    Appearance: She is well-developed.  HENT:     Head: Normocephalic and atraumatic.     Mouth/Throat:     Mouth: Mucous membranes are moist.  Eyes:     Conjunctiva/sclera: Conjunctivae normal.  Cardiovascular:     Rate and Rhythm: Normal rate and regular rhythm.     Heart sounds: No murmur heard. Pulmonary:     Effort: Pulmonary effort is normal. No respiratory distress.     Breath sounds: Normal breath sounds.  Abdominal:     General: There is no distension.     Palpations: Abdomen is soft.     Tenderness: There is no abdominal tenderness. There is no guarding or rebound.  Musculoskeletal:        General: No swelling.     Cervical back: Neck supple.  Skin:    General: Skin is warm and dry.     Capillary Refill: Capillary refill takes less than 2 seconds.  Neurological:     General: No focal deficit present.     Mental Status: She is alert and oriented to person, place, and time.  Psychiatric:        Mood and Affect: Mood normal.     ED Results / Procedures / Treatments   Labs (all labs ordered are listed, but only abnormal results are displayed) Labs Reviewed  CBC WITH DIFFERENTIAL/PLATELET - Abnormal; Notable for the following components:      Result Value   Neutro Abs 8.0 (*)    All other components within normal limits  COMPREHENSIVE METABOLIC PANEL - Abnormal; Notable for the following components:   Sodium 134 (*)    Potassium 3.1 (*)    Calcium 8.8 (*)    Total Bilirubin 1.7 (*)    All other components within normal limits  URINALYSIS, ROUTINE W REFLEX MICROSCOPIC - Abnormal; Notable for the following components:   Color, Urine AMBER (*)    APPearance HAZY (*)    Specific Gravity, Urine 1.032 (*)    Ketones, ur 20 (*)    Protein, ur 100 (*)    All other components within normal limits  LIPASE, BLOOD  PREGNANCY,  URINE    EKG None  Radiology No results found.  Procedures Procedures    Medications Ordered in ED Medications  ondansetron (ZOFRAN) injection 4 mg (4 mg Intravenous Given 09/21/23 1040)  ketorolac (TORADOL) 15 MG/ML injection 15 mg (15 mg Intravenous Given 09/21/23 1048)  potassium chloride SA (KLOR-CON M) CR tablet 40 mEq (40 mEq Oral Given 09/21/23 1150)    ED Course/ Medical Decision Making/ A&P                                 Medical Decision Making This patient presents to the ED for concern of vomiting and diarrhea, this involves an extensive number of treatment options, and is a complaint that carries with it a high risk of complications and morbidity.  The differential diagnosis includes gastritis, gastroenteritis, peptic ulcer disease, cholecystitis, pancreatitis, cyclic vomiting, UTI, hyperemesis gravidarum, other   Co morbidities that complicate the patient evaluation :   None   Additional history obtained:  Additional history obtained from EMR External records from outside source obtained and reviewed including notes, labs   Consultations Obtained:  Discussed with ED attending Dr. Denton Lank   Problem List / ED Course / Critical interventions / Medication management To the ER for 4 days of nausea, diarrhea.  She developed vomiting the first day but no vomiting since then but she has had decreased p.o. intake.  No abdominal pain no tenderness on exam.  I discussed with patient due to IV fluid shortage we would try oral rehydration as she otherwise appears well and has normal vitals and obtain some labs.    Labs showed no leukocytosis, no anemia, normal lipase the patient does have mild hypokalemia which will be repleted orally and also noted to have a group of 1.7 without any other LFT elevations.  On repeat abdominal exam patient still is not soft and nontender especially has negative Murphy sign, no right upper quadrant tenderness at all.  I discussed these  results with her, not sure if there is any significance to this but recommended close follow-up with PCP for recheck and if still elevated may need further testing such as ultrasound though at this time I do not feel she needs any emergent imaging.  She is feeling somewhat better on my reevaluation and is started to drink some water, will recheck when she is been able to finish her oral rehydration.  This time she is sitting upright in the bed, doing some work on her laptop and appearing well   Urinalysis shows increased specific gravity, 20 ketones and some proteinuria but no nitrate or leukocytes consistent with patient's dehydration. I ordered medication including zofran  for nausea  Reevaluation of the patient after these medicines showed that the patient improved I have reviewed the patients home medicines and have made adjustments as needed      Amount and/or Complexity of Data Reviewed Labs: ordered. Decision-making details documented in ED Course.  Risk Prescription drug management.           Final Clinical Impression(s) / ED Diagnoses Final diagnoses:  Nausea  Dehydration    Rx / DC Orders ED Discharge Orders          Ordered    ondansetron (ZOFRAN-ODT) 4 MG disintegrating tablet  Every 6 hours PRN        09/21/23 [id number] [address], PA-C 09/21/23 1546    Cathren Laine, MD 09/25/23 [phone]

## 2023-09-21 NOTE — Discharge Instructions (Addendum)
It was a pleasure taking care of you today.  You were seen for nausea and dehydration.  Your blood work was overall very reassuring, your urine did show signs of dehydration but no signs of infection.  Potassium was slightly low and you were given medication to replete this.  You are given Zofran for nausea and given a prescription for this as well.  The only other abnormality on your lab was a slightly elevated bilirubin which was 1.7.  Normal is under 1.2.  He had no other liver test abnormalities.  Please follow-up with your primary care doctor to recheck your labs in 1 to 2 weeks.  If you develop pain, have persistent vomiting, or noticed yellow coloring to your skin or eyes you need to come to the ER right away.  Overall the symptoms are likely due to a stomach virus is important to follow-up closely with your doctor if you are not getting better but certainly come back if you get worse.  Drink plenty of fluids and rest.

## 2023-09-21 NOTE — ED Notes (Signed)
Pt given a can of sprite and cup of water.

## 2023-09-21 NOTE — ED Notes (Signed)
Pt reports significant improvement in symptoms after administration of zofran and toradol. Pt has had very little PO intake, but will be trying to drink more now that symptoms have improved.

## 2023-09-21 NOTE — ED Triage Notes (Signed)
Pt reports n/v started 4 days ago but she only vomited the first day, none seen. Generalized weakness. Headache started yesterday.

## 2023-09-22 ENCOUNTER — Other Ambulatory Visit: Payer: Self-pay

## 2023-09-22 ENCOUNTER — Encounter (HOSPITAL_COMMUNITY): Payer: Self-pay | Admitting: *Deleted

## 2023-09-22 ENCOUNTER — Emergency Department (HOSPITAL_COMMUNITY)
Admission: EM | Admit: 2023-09-22 | Discharge: 2023-09-22 | Disposition: A | Discharge status: Home / Self Care | Payer: Managed Care, Other (non HMO) | Attending: Emergency Medicine | Admitting: Emergency Medicine

## 2023-09-22 DIAGNOSIS — E860 Dehydration: Secondary | ICD-10-CM | POA: Diagnosis not present

## 2023-09-22 DIAGNOSIS — Z1152 Encounter for screening for COVID-19: Secondary | ICD-10-CM | POA: Diagnosis not present

## 2023-09-22 DIAGNOSIS — J45909 Unspecified asthma, uncomplicated: Secondary | ICD-10-CM | POA: Insufficient documentation

## 2023-09-22 DIAGNOSIS — I10 Essential (primary) hypertension: Secondary | ICD-10-CM | POA: Insufficient documentation

## 2023-09-22 DIAGNOSIS — R42 Dizziness and giddiness: Secondary | ICD-10-CM | POA: Insufficient documentation

## 2023-09-22 LAB — URINALYSIS, W/ REFLEX TO CULTURE (INFECTION SUSPECTED)
Bacteria, UA: NONE SEEN
Bilirubin Urine: NEGATIVE
Glucose, UA: NEGATIVE mg/dL
Hgb urine dipstick: NEGATIVE
Ketones, ur: 20 mg/dL — AB
Leukocytes,Ua: NEGATIVE
Nitrite: NEGATIVE
Protein, ur: 100 mg/dL — AB
Specific Gravity, Urine: 1.03 (ref 1.005–1.030)
pH: 5 (ref 5.0–8.0)

## 2023-09-22 LAB — COMPREHENSIVE METABOLIC PANEL
ALT: 23 U/L (ref 0–44)
AST: 18 U/L (ref 15–41)
Albumin: 4.1 g/dL (ref 3.5–5.0)
Alkaline Phosphatase: 48 U/L (ref 38–126)
Anion gap: 11 (ref 5–15)
BUN: 13 mg/dL (ref 6–20)
CO2: 22 mmol/L (ref 22–32)
Calcium: 8.7 mg/dL — ABNORMAL LOW (ref 8.9–10.3)
Chloride: 103 mmol/L (ref 98–111)
Creatinine, Ser: 0.86 mg/dL (ref 0.44–1.00)
GFR, Estimated: >60 mL/min (ref >60)
Glucose, Bld: 94 mg/dL (ref 70–99)
Potassium: 3.4 mmol/L — ABNORMAL LOW (ref 3.5–5.1)
Sodium: 136 mmol/L (ref 135–145)
Total Bilirubin: 1.2 mg/dL (ref 0.3–1.2)
Total Protein: 7.4 g/dL (ref 6.5–8.1)

## 2023-09-22 LAB — CBC WITH DIFFERENTIAL/PLATELET
Abs Immature Granulocytes: 0.02 10*3/uL (ref 0.00–0.07)
Basophils Absolute: 0 10*3/uL (ref 0.0–0.1)
Basophils Relative: 0 %
Eosinophils Absolute: 0 10*3/uL (ref 0.0–0.5)
Eosinophils Relative: 0 %
HCT: 40.5 % (ref 36.0–46.0)
Hemoglobin: 13.6 g/dL (ref 12.0–15.0)
Immature Granulocytes: 0 %
Lymphocytes Relative: 11 %
Lymphs Abs: 0.9 10*3/uL (ref 0.7–4.0)
MCH: 30.1 pg (ref 26.0–34.0)
MCHC: 33.6 g/dL (ref 30.0–36.0)
MCV: 89.6 fL (ref 80.0–100.0)
Monocytes Absolute: 0.6 10*3/uL (ref 0.1–1.0)
Monocytes Relative: 7 %
Neutro Abs: 6.7 10*3/uL (ref 1.7–7.7)
Neutrophils Relative %: 82 %
Platelets: 182 10*3/uL (ref 150–400)
RBC: 4.52 MIL/uL (ref 3.87–5.11)
RDW: 12.2 % (ref 11.5–15.5)
WBC: 8.3 10*3/uL (ref 4.0–10.5)
nRBC: 0 % (ref 0.0–0.2)

## 2023-09-22 LAB — RESP PANEL BY RT-PCR (RSV, FLU A&B, COVID)  RVPGX2
Influenza A by PCR: NEGATIVE
Influenza B by PCR: NEGATIVE
Resp Syncytial Virus by PCR: NEGATIVE
SARS Coronavirus 2 by RT PCR: NEGATIVE

## 2023-09-22 LAB — PREGNANCY, URINE: Preg Test, Ur: NEGATIVE

## 2023-09-22 MED ORDER — MECLIZINE HCL 25 MG PO TABS: 25.0000 mg | ORAL_TABLET | Freq: Three times a day (TID) | ORAL | 0 refills | Status: AC | PRN

## 2023-09-22 MED ORDER — MECLIZINE HCL 12.5 MG PO TABS
25.0000 mg | ORAL_TABLET | Freq: Once | ORAL | Status: AC
  Administered 2023-09-22: 25 mg via ORAL
  Filled 2023-09-22: qty 2

## 2023-09-22 MED ORDER — ONDANSETRON HCL 4 MG/2ML IJ SOLN
4.0000 mg | Freq: Once | INTRAMUSCULAR | Status: AC
  Administered 2023-09-22: 4 mg via INTRAVENOUS
  Filled 2023-09-22: qty 2

## 2023-09-22 MED ORDER — SODIUM CHLORIDE 0.9 % IV BOLUS
1000.0000 mL | Freq: Once | INTRAVENOUS | Status: AC
  Administered 2023-09-22: 1000 mL via INTRAVENOUS

## 2023-09-22 MED ORDER — KETOROLAC TROMETHAMINE 30 MG/ML IJ SOLN
30.0000 mg | Freq: Once | INTRAMUSCULAR | Status: AC
  Administered 2023-09-22: 30 mg via INTRAVENOUS
  Filled 2023-09-22: qty 1

## 2023-09-22 NOTE — ED Notes (Signed)
Asked MD, gave pt ice water

## 2023-09-22 NOTE — ED Provider Notes (Signed)
[place] EMERGENCY DEPARTMENT AT [facility] Provider Note   CSN: [id number] Arrival date & time: 09/22/23  7829     History  Chief Complaint  Patient presents with   Dizziness    Molly Burton is a 27 y.o. female.  Pt is a 27 yo female with pmhx significant for htn and asthma.  She has been weak since Monday, 10/21. She was seen here yesterday and felt better, but said she's been dizzy and nauseous since d/c.  She feels dizzy when she stands up and moves her head.  She denies any pain.  She has an IUD, so no period in months.  Preg neg yesterday.  No pain.       Home Medications Prior to Admission medications   Medication Sig Start Date End Date Taking? Authorizing Provider  meclizine (ANTIVERT) 25 MG tablet Take 1 tablet (25 mg total) by mouth 3 (three) times daily as needed for dizziness. 09/22/23  Yes Jacalyn Lefevre, MD  EPINEPHrine 0.3 mg/0.3 mL IJ SOAJ injection Inject 0.3 mg into the muscle as needed for anaphylaxis. 02/10/23   Alfonse Spruce, MD  fluconazole (DIFLUCAN) 150 MG tablet Take 1 now and 1 in 3 days 07/20/23   Adline Potter, NP  levocetirizine (XYZAL) 5 MG tablet Take 1 tablet (5 mg total) by mouth in the morning and at bedtime. 06/29/22 07/29/22  Alfonse Spruce, MD  metroNIDAZOLE (FLAGYL) 500 MG tablet Take 1 tablet (500 mg total) by mouth 2 (two) times daily. 07/20/23   Adline Potter, NP  ondansetron (ZOFRAN-ODT) 4 MG disintegrating tablet Take 1 tablet (4 mg total) by mouth every 6 (six) hours as needed for nausea or vomiting. 09/21/23   Carmel Sacramento A, PA-C      Allergies    Patient has no known allergies.    Review of Systems   Review of Systems  Gastrointestinal:  Positive for nausea.  Neurological:  Positive for dizziness.  All other systems reviewed and are negative.   Physical Exam Updated Vital Signs BP 113/68   Pulse 73   Temp 98.5 F (36.9 C) (Oral)   Resp 20   Ht 5\' 2"  (1.575 m)   Wt 59 kg    SpO2 98%   BMI 23.78 kg/m  Physical Exam Vitals and nursing note reviewed.  Constitutional:      Appearance: Normal appearance.  HENT:     Head: Normocephalic and atraumatic.     Right Ear: External ear normal.     Left Ear: External ear normal.     Nose: Nose normal.     Mouth/Throat:     Mouth: Mucous membranes are dry.  Eyes:     Extraocular Movements: Extraocular movements intact.     Conjunctiva/sclera: Conjunctivae normal.     Pupils: Pupils are equal, round, and reactive to light.  Cardiovascular:     Rate and Rhythm: Normal rate and regular rhythm.     Pulses: Normal pulses.     Heart sounds: Normal heart sounds.  Pulmonary:     Effort: Pulmonary effort is normal.     Breath sounds: Normal breath sounds.  Abdominal:     General: Abdomen is flat. Bowel sounds are normal.     Palpations: Abdomen is soft.  Musculoskeletal:        General: Normal range of motion.     Cervical back: Normal range of motion and neck supple.  Skin:    General: Skin is warm.  Capillary Refill: Capillary refill takes less than 2 seconds.  Neurological:     General: No focal deficit present.     Mental Status: She is alert and oriented to person, place, and time.  Psychiatric:        Mood and Affect: Mood normal.        Behavior: Behavior normal.     ED Results / Procedures / Treatments   Labs (all labs ordered are listed, but only abnormal results are displayed) Labs Reviewed  COMPREHENSIVE METABOLIC PANEL - Abnormal; Notable for the following components:      Result Value   Potassium 3.4 (*)    Calcium 8.7 (*)    All other components within normal limits  URINALYSIS, W/ REFLEX TO CULTURE (INFECTION SUSPECTED) - Abnormal; Notable for the following components:   Color, Urine AMBER (*)    APPearance HAZY (*)    Ketones, ur 20 (*)    Protein, ur 100 (*)    All other components within normal limits  RESP PANEL BY RT-PCR (RSV, FLU A&B, COVID)  RVPGX2  CBC WITH  DIFFERENTIAL/PLATELET  PREGNANCY, URINE    EKG EKG Interpretation Date/Time:  Friday September 22 2023 09:30:43 EDT Ventricular Rate:  82 PR Interval:  137 QRS Duration:  87 QT Interval:  345 QTC Calculation: 403 R Axis:   12  Text Interpretation: Sinus rhythm No significant change since last tracing Confirmed by Jacalyn Lefevre [phone]) on 09/22/2023 9:33:55 AM  Radiology No results found.  Procedures Procedures    Medications Ordered in ED Medications  sodium chloride 0.9 % bolus 1,000 mL (0 mLs Intravenous Stopped 09/22/23 1053)  ondansetron (ZOFRAN) injection 4 mg (4 mg Intravenous Given 09/22/23 0941)  ketorolac (TORADOL) 30 MG/ML injection 30 mg (30 mg Intravenous Given 09/22/23 0941)  sodium chloride 0.9 % bolus 1,000 mL (1,000 mLs Intravenous Bolus 09/22/23 1056)  sodium chloride 0.9 % bolus 1,000 mL (1,000 mLs Intravenous Bolus 09/22/23 1208)  meclizine (ANTIVERT) tablet 25 mg (25 mg Oral Given 09/22/23 1446)    ED Course/ Medical Decision Making/ A&P                                 Medical Decision Making Amount and/or Complexity of Data Reviewed Labs: ordered.  Risk Prescription drug management.   This patient presents to the ED for concern of dizziness, this involves an extensive number of treatment options, and is a complaint that carries with it a high risk of complications and morbidity.  The differential diagnosis includes dehydration, electrolyte abn, infection, pregnancy, covid, vertigo   Co morbidities that complicate the patient evaluation  Htn and asthma   Additional history obtained:  Additional history obtained from epic chart review External records from outside source obtained and reviewed including mom   Lab Tests:  I Ordered, and personally interpreted labs.  The pertinent results include:  cbc nl, cmp nl, preg neg, ua neg for uti, but + ketones, covid/flu/rsv neg  Cardiac Monitoring:  The patient was maintained on a cardiac  monitor.  I personally viewed and interpreted the cardiac monitored which showed an underlying rhythm of: nsr   Medicines ordered and prescription drug management:  I ordered medication including ivfs/toradol/antivert  for sx  Reevaluation of the patient after these medicines showed that the patient improved I have reviewed the patients home medicines and have made adjustments as needed   Problem List / ED Course:  Vertigo:  sx have improved with fluids.  She is still a little dizzy with ambulating, but feels well enough to go home.  She is eating/drinking.  She is stable for d/c.  Return if worse.     Reevaluation:  After the interventions noted above, I reevaluated the patient and found that they have :improved   Social Determinants of Health:  Lives at home   Dispostion:  After consideration of the diagnostic results and the patients response to treatment, I feel that the patent would benefit from discharge with outpatient f/u.          Final Clinical Impression(s) / ED Diagnoses Final diagnoses:  Vertigo  Dehydration    Rx / DC Orders ED Discharge Orders          Ordered    meclizine (ANTIVERT) 25 MG tablet  3 times daily PRN        09/22/23 1526              Jacalyn Lefevre, MD 09/22/23 1528

## 2023-09-22 NOTE — ED Notes (Signed)
Pt ambulated from bed to hallway. At rest pt denies dizziness, but when she stood up and started walking she became dizzy.

## 2023-09-22 NOTE — ED Triage Notes (Addendum)
Pt c/o "head spinning", chills, face sweating that started this morning. Denies n/v, headache. Pt here yesterday but for headache, weakness, nausea. Pt reports all her symptoms are new except for she still has oncoming weakness. Pt also c/o right arm weakness since yesterday when her IV was placed.

## 2023-09-22 NOTE — ED Notes (Signed)
Pt ambulated from bed to hallway, tolerated well but endorses feeling less dizzy than earlier.

## 2023-09-22 NOTE — ED Notes (Signed)
Pt given [place] sandwich, cranberry juice and crackers.

## 2023-10-04 ENCOUNTER — Ambulatory Visit (INDEPENDENT_AMBULATORY_CARE_PROVIDER_SITE_OTHER): Payer: Managed Care, Other (non HMO)

## 2023-10-04 DIAGNOSIS — J309 Allergic rhinitis, unspecified: Secondary | ICD-10-CM | POA: Diagnosis not present

## 2023-10-04 MED ORDER — EPINEPHRINE 0.3 MG/0.3ML IJ SOAJ: 0.3000 mg | INTRAMUSCULAR | 2 refills | Status: AC | PRN

## 2023-10-15 ENCOUNTER — Other Ambulatory Visit: Payer: Self-pay | Admitting: Family Medicine

## 2023-10-20 ENCOUNTER — Ambulatory Visit (INDEPENDENT_AMBULATORY_CARE_PROVIDER_SITE_OTHER): Payer: Self-pay

## 2023-10-20 DIAGNOSIS — J309 Allergic rhinitis, unspecified: Secondary | ICD-10-CM | POA: Diagnosis not present

## 2023-11-10 ENCOUNTER — Ambulatory Visit (INDEPENDENT_AMBULATORY_CARE_PROVIDER_SITE_OTHER): Payer: Self-pay

## 2023-11-10 DIAGNOSIS — J309 Allergic rhinitis, unspecified: Secondary | ICD-10-CM | POA: Diagnosis not present

## 2023-11-17 ENCOUNTER — Ambulatory Visit (INDEPENDENT_AMBULATORY_CARE_PROVIDER_SITE_OTHER): Payer: Self-pay

## 2023-11-17 DIAGNOSIS — J309 Allergic rhinitis, unspecified: Secondary | ICD-10-CM | POA: Diagnosis not present

## 2023-11-30 ENCOUNTER — Encounter: Payer: Self-pay | Admitting: Emergency Medicine

## 2023-11-30 ENCOUNTER — Ambulatory Visit
Admission: EM | Admit: 2023-11-30 | Discharge: 2023-11-30 | Disposition: A | Discharge status: Home / Self Care | Payer: Managed Care, Other (non HMO) | Attending: Family Medicine | Admitting: Family Medicine

## 2023-11-30 DIAGNOSIS — J111 Influenza due to unidentified influenza virus with other respiratory manifestations: Secondary | ICD-10-CM | POA: Diagnosis not present

## 2023-11-30 LAB — POCT INFLUENZA A/B
Influenza A, POC: POSITIVE — AB
Influenza B, POC: NEGATIVE

## 2023-11-30 MED ORDER — OSELTAMIVIR PHOSPHATE 75 MG PO CAPS: 75.0000 mg | ORAL_CAPSULE | Freq: Two times a day (BID) | ORAL | 0 refills | Status: AC

## 2023-11-30 MED ORDER — PSEUDOEPH-BROMPHEN-DM 30-2-10 MG/5ML PO SYRP: 5.0000 mL | ORAL_SOLUTION | Freq: Four times a day (QID) | ORAL | 0 refills | Status: AC | PRN

## 2023-11-30 NOTE — ED Triage Notes (Signed)
 Pt reports nasal congestion, facial pressure, and "head heaviness" since last night.

## 2023-11-30 NOTE — ED Provider Notes (Signed)
 [facility]    CSN: [id number] Arrival date & time: 11/30/23  1428      History   Chief Complaint Chief Complaint  Patient presents with   Nasal Congestion    HPI Molly Burton is a 28 y.o. female.   Presenting today with 1 day history of fever, chills, body aches, nasal congestion, scratchy throat, facial pressure.  Denies cough, chest pain, shortness of breath, abdominal pain, nausea vomiting or diarrhea.  So far not trying anything over-the-counter for symptoms.  History of asthma not currently on any inhalers.    Past Medical History:  Diagnosis Date   Asthma    Hypertension    started during pregnancy    Patient Active Problem List   Diagnosis Date Noted   Elevated alkaline phosphatase level 03/29/2022   Fatigue 07/23/2021   Headache 07/23/2021   Encounter for IUD insertion 06/21/2021   Callus of heel 06/21/2021   History of cesarean delivery 09/16/2020   H/O pre-eclampsia 09/16/2020   Arthralgia    Hyperthyroidism 02/08/2018    Past Surgical History:  Procedure Laterality Date   CESAREAN SECTION N/A 03/12/2019   Procedure: CESAREAN SECTION;  Surgeon: Barbra Lang PARAS, DO;  Location: [facility] LD ORS;  Service: Obstetrics;  Laterality: N/A;   CESAREAN SECTION MULTI-GESTATIONAL N/A 03/08/2021   Procedure: CESAREAN SECTION MULTI-GESTATIONAL;  Surgeon: Jayne Vonn DEL, MD;  Location: [facility] LD ORS;  Service: Obstetrics;  Laterality: N/A;   TONSILLECTOMY      OB History     Gravida  2   Para  2   Term  2   Preterm      AB      Living  3      SAB      IAB      Ectopic      Multiple  1   Live Births  3            Home Medications    Prior to Admission medications   Medication Sig Start Date End Date Taking? Authorizing Provider  brompheniramine-pseudoephedrine-DM 30-2-10 MG/5ML syrup Take 5 mLs by mouth 4 (four) times daily as needed. 11/30/23  Yes Stuart Vernell Norris, PA-C  oseltamivir  (TAMIFLU ) 75 MG capsule Take 1 capsule (75  mg total) by mouth every 12 (twelve) hours. 11/30/23  Yes Stuart Vernell Norris, PA-C  EPINEPHrine  0.3 mg/0.3 mL IJ SOAJ injection Inject 0.3 mg into the muscle as needed for anaphylaxis. 10/04/23   Iva Marty Saltness, MD  fluconazole  (DIFLUCAN ) 150 MG tablet Take 1 now and 1 in 3 days 07/20/23   Signa Nest A, NP  levocetirizine (XYZAL ) 5 MG tablet Take 1 tablet (5 mg total) by mouth in the morning and at bedtime. 06/29/22 07/29/22  Iva Marty Saltness, MD  meclizine  (ANTIVERT ) 25 MG tablet Take 1 tablet (25 mg total) by mouth 3 (three) times daily as needed for dizziness. 09/22/23   Dean Clarity, MD  NIFEdipine  (PROCARDIA  XL/NIFEDICAL-XL) 90 MG 24 hr tablet TAKE ONE TABLET BY MOUTH ONCE DAILY. 10/16/23   Antonetta Rollene BRAVO, MD  ondansetron  (ZOFRAN -ODT) 4 MG disintegrating tablet Take 1 tablet (4 mg total) by mouth every 6 (six) hours as needed for nausea or vomiting. 09/21/23   Suellen Sherran LABOR, PA-C    Family History Family History  Problem Relation Age of Onset   Hyperlipidemia Mother    Hypertension Father    Thyroid  disease Maternal Grandmother    Thyroid  disease Paternal Grandmother    Colon  cancer Neg Hx    Celiac disease Neg Hx    Liver disease Neg Hx    Inflammatory bowel disease Neg Hx     Social History Social History   Tobacco Use   Smoking status: Never   Smokeless tobacco: Never  Vaping Use   Vaping status: Never Used  Substance Use Topics   Alcohol use: Yes    Comment: Pt did have wine on 08/05/2018. no etoh 03/29/22   Drug use: Not Currently    Types: Marijuana    Comment: not since 07/03/18     Allergies   Patient has no known allergies.   Review of Systems Review of Systems Per HPI  Physical Exam Triage Vital Signs ED Triage Vitals [11/30/23 1525]  Encounter Vitals Group     BP 128/89     Systolic BP Percentile      Diastolic BP Percentile      Pulse Rate 76     Resp 20     Temp 100 F (37.8 C)     Temp Source Oral     SpO2 96 %      Weight      Height      Head Circumference      Peak Flow      Pain Score      Pain Loc      Pain Education      Exclude from Growth Chart    No data found.  Updated Vital Signs BP 128/89 (BP Location: Right Arm)   Pulse 76   Temp 100 F (37.8 C) (Oral)   Resp 20   SpO2 96%   Visual Acuity Right Eye Distance:   Left Eye Distance:   Bilateral Distance:    Right Eye Near:   Left Eye Near:    Bilateral Near:     Physical Exam Vitals and nursing note reviewed.  Constitutional:      Appearance: Normal appearance.  HENT:     Head: Atraumatic.     Right Ear: Tympanic membrane and external ear normal.     Left Ear: Tympanic membrane and external ear normal.     Nose: Rhinorrhea present.     Mouth/Throat:     Mouth: Mucous membranes are moist.     Pharynx: Posterior oropharyngeal erythema present.  Eyes:     Extraocular Movements: Extraocular movements intact.     Conjunctiva/sclera: Conjunctivae normal.  Cardiovascular:     Rate and Rhythm: Normal rate and regular rhythm.     Heart sounds: Normal heart sounds.  Pulmonary:     Effort: Pulmonary effort is normal.     Breath sounds: Normal breath sounds. No wheezing or rales.  Musculoskeletal:        General: Normal range of motion.     Cervical back: Normal range of motion and neck supple.  Skin:    General: Skin is warm and dry.  Neurological:     Mental Status: She is alert and oriented to person, place, and time.  Psychiatric:        Mood and Affect: Mood normal.        Thought Content: Thought content normal.      UC Treatments / Results  Labs (all labs ordered are listed, but only abnormal results are displayed) Labs Reviewed  POCT INFLUENZA A/B - Abnormal; Notable for the following components:      Result Value   Influenza A, POC Positive (*)    All other components  within normal limits    EKG   Radiology No results found.  Procedures Procedures (including critical care time)  Medications  Ordered in UC Medications - No data to display  Initial Impression / Assessment and Plan / UC Course  I have reviewed the triage vital signs and the nursing notes.  Pertinent labs & imaging results that were available during my care of the patient were reviewed by me and considered in my medical decision making (see chart for details).     Flu positive for influenza A.  Treat with Tamiflu , cough syrup, supportive the counter medications and home care.  Return for worsening symptoms.  Final Clinical Impressions(s) / UC Diagnoses   Final diagnoses:  Influenza   Discharge Instructions   None    ED Prescriptions     Medication Sig Dispense Auth. Provider   oseltamivir  (TAMIFLU ) 75 MG capsule Take 1 capsule (75 mg total) by mouth every 12 (twelve) hours. 10 capsule Stuart Vernell Norris, PA-C   brompheniramine-pseudoephedrine-DM 30-2-10 MG/5ML syrup Take 5 mLs by mouth 4 (four) times daily as needed. 120 mL Stuart Vernell Norris, [place]      PDMP not reviewed this encounter.   Stuart Vernell Norris, [place] 11/30/23 [phone]

## 2023-12-01 ENCOUNTER — Ambulatory Visit (INDEPENDENT_AMBULATORY_CARE_PROVIDER_SITE_OTHER): Payer: Self-pay

## 2023-12-01 DIAGNOSIS — J309 Allergic rhinitis, unspecified: Secondary | ICD-10-CM

## 2023-12-22 ENCOUNTER — Ambulatory Visit (INDEPENDENT_AMBULATORY_CARE_PROVIDER_SITE_OTHER): Payer: Self-pay

## 2023-12-22 DIAGNOSIS — J309 Allergic rhinitis, unspecified: Secondary | ICD-10-CM

## 2023-12-30 ENCOUNTER — Ambulatory Visit
Admission: EM | Admit: 2023-12-30 | Discharge: 2023-12-30 | Discharge status: Against Medical Advice | Payer: Managed Care, Other (non HMO)

## 2023-12-30 NOTE — ED Notes (Addendum)
Called pt  and told her to come in and she said could we give her 5 minutes. I gave her that and told her I would go do something for a patient and call her back. Due to the long wait time I provided her an exception. Pt left the premises and did not answer when I called her at 3:45 pm. Pt returned to the clinic at 3:56pm. I informed front desk that the patient could make an appointment for tomorrow or walk in.

## 2024-01-03 ENCOUNTER — Ambulatory Visit (INDEPENDENT_AMBULATORY_CARE_PROVIDER_SITE_OTHER): Payer: Managed Care, Other (non HMO)

## 2024-01-03 DIAGNOSIS — J309 Allergic rhinitis, unspecified: Secondary | ICD-10-CM

## 2024-01-17 ENCOUNTER — Ambulatory Visit (INDEPENDENT_AMBULATORY_CARE_PROVIDER_SITE_OTHER): Payer: Self-pay

## 2024-01-17 DIAGNOSIS — J309 Allergic rhinitis, unspecified: Secondary | ICD-10-CM

## 2024-01-19 ENCOUNTER — Ambulatory Visit (INDEPENDENT_AMBULATORY_CARE_PROVIDER_SITE_OTHER): Payer: Managed Care, Other (non HMO) | Admitting: Podiatry

## 2024-01-19 DIAGNOSIS — M21962 Unspecified acquired deformity of left lower leg: Secondary | ICD-10-CM | POA: Diagnosis not present

## 2024-01-19 DIAGNOSIS — M21961 Unspecified acquired deformity of right lower leg: Secondary | ICD-10-CM

## 2024-01-19 MED ORDER — AMMONIUM LACTATE 12 % EX LOTN: 1.0000 | TOPICAL_LOTION | CUTANEOUS | 0 refills | Status: AC | PRN

## 2024-01-19 NOTE — Progress Notes (Signed)
 Subjective:  Patient ID: Molly Burton, female    DOB: 06/27/1996,  MRN: [id number]  Chief Complaint  Patient presents with   Callouses    Pt stated that her skin is cracking and is dry     28 y.o. female presents with the above complaint.  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  Patient states that it has been present for quite some time.  They have progressively gotten worse.  Is causing her some issues she wanted discuss treatment options for this.  She would like to discuss orthotics option as she currently does not wear any.  She also was helpful issues.  Denies any other acute issues.   Review of Systems: Negative except as noted in the HPI. Denies N/V/F/Ch.  Past Medical History:  Diagnosis Date   Asthma    Hypertension    started during pregnancy    Current Outpatient Medications:    ammonium lactate (AMLACTIN DAILY) 12 % lotion, Apply 1 Application topically as needed., Disp: 400 g, Rfl: 0   brompheniramine-pseudoephedrine-DM 30-2-10 MG/5ML syrup, Take 5 mLs by mouth 4 (four) times daily as needed., Disp: 120 mL, Rfl: 0   EPINEPHrine 0.3 mg/0.3 mL IJ SOAJ injection, Inject 0.3 mg into the muscle as needed for anaphylaxis., Disp: 2 each, Rfl: 2   fluconazole (DIFLUCAN) 150 MG tablet, Take 1 now and 1 in 3 days, Disp: 2 tablet, Rfl: 1   levocetirizine (XYZAL) 5 MG tablet, Take 1 tablet (5 mg total) by mouth in the morning and at bedtime., Disp: 60 tablet, Rfl: 11   meclizine (ANTIVERT) 25 MG tablet, Take 1 tablet (25 mg total) by mouth 3 (three) times daily as needed for dizziness., Disp: 30 tablet, Rfl: 0   NIFEdipine (PROCARDIA XL/NIFEDICAL-XL) 90 MG 24 hr tablet, TAKE ONE TABLET BY MOUTH ONCE DAILY., Disp: 30 tablet, Rfl: 2   ondansetron (ZOFRAN-ODT) 4 MG disintegrating tablet, Take 1 tablet (4 mg total) by mouth every 6 (six) hours as needed for nausea or vomiting., Disp: 15 tablet, Rfl: 0    oseltamivir (TAMIFLU) 75 MG capsule, Take 1 capsule (75 mg total) by mouth every 12 (twelve) hours., Disp: 10 capsule, Rfl: 0  Social History   Tobacco Use  Smoking Status Never  Smokeless Tobacco Never    No Known Allergies Objective:  There were no vitals filed for this visit. There is no height or weight on file to calculate BMI. Constitutional Well developed. Well nourished.  Vascular Dorsalis pedis pulses palpable bilaterally. Posterior tibial pulses palpable bilaterally. Capillary refill normal to all digits.  No cyanosis or clubbing noted. Pedal hair growth normal.  Neurologic Normal speech. Oriented to person, place, and time. Epicritic sensation to light touch grossly present bilaterally.  Dermatologic Bilateral submetatarsal 5 hyperkeratotic lesion with central nucleated core noted pain on palpation to the lesion  Orthopedic: Normal joint ROM without pain or crepitus bilaterally. No visible deformities. No bony tenderness.   Radiographs: None Assessment:   1. Deformity of both feet    Plan:  Patient was evaluated and treated and all questions answered.  Bilateral submetatarsal 5 porokeratosis with underlying pes planovalgus  -I explained to patient the etiology of pes planovalgus and relationship with Planter fasciitis and various treatment options were discussed.  Given patient foot structure in the setting of Planter fasciitis I believe patient will benefit from custom-made orthotics to help control the hindfoot motion support the arch of the foot and take the stress  away from plantar fascial.  Patient agrees with the plan like to proceed with orthotics -Patient was casted for orthotics with offloading of bilateral fifth metatarsal   No follow-ups on file.  Pes planovalgus with submetatarsal 5 hyperkeratotic lesion orthotics offload submet 5

## 2024-02-04 NOTE — Progress Notes (Signed)
  Orthotic order placed will schedule for fitting when in

## 2024-02-09 ENCOUNTER — Ambulatory Visit (INDEPENDENT_AMBULATORY_CARE_PROVIDER_SITE_OTHER): Payer: Self-pay

## 2024-02-09 DIAGNOSIS — J309 Allergic rhinitis, unspecified: Secondary | ICD-10-CM | POA: Diagnosis not present

## 2024-02-16 ENCOUNTER — Ambulatory Visit (INDEPENDENT_AMBULATORY_CARE_PROVIDER_SITE_OTHER): Payer: Self-pay

## 2024-02-16 DIAGNOSIS — J309 Allergic rhinitis, unspecified: Secondary | ICD-10-CM

## 2024-02-23 ENCOUNTER — Ambulatory Visit (INDEPENDENT_AMBULATORY_CARE_PROVIDER_SITE_OTHER): Payer: Self-pay

## 2024-02-23 DIAGNOSIS — J309 Allergic rhinitis, unspecified: Secondary | ICD-10-CM

## 2024-02-28 ENCOUNTER — Ambulatory Visit (INDEPENDENT_AMBULATORY_CARE_PROVIDER_SITE_OTHER): Payer: Self-pay

## 2024-02-28 DIAGNOSIS — J309 Allergic rhinitis, unspecified: Secondary | ICD-10-CM | POA: Diagnosis not present

## 2024-03-28 ENCOUNTER — Other Ambulatory Visit

## 2024-03-29 DIAGNOSIS — H1013 Acute atopic conjunctivitis, bilateral: Secondary | ICD-10-CM | POA: Diagnosis not present

## 2024-03-29 DIAGNOSIS — J309 Allergic rhinitis, unspecified: Secondary | ICD-10-CM | POA: Diagnosis not present

## 2024-04-05 ENCOUNTER — Ambulatory Visit

## 2024-04-29 ENCOUNTER — Telehealth: Payer: Self-pay

## 2024-04-29 NOTE — Telephone Encounter (Signed)
 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 Patient expressed that she is going to speak with a provider and nurse to see who can give her her injections there and call me back. Patient expressed that she would want the papers faxed to [phone] once she concludes who is going to give her her injections.

## 2024-04-29 NOTE — Telephone Encounter (Signed)
 Called to see if patient wanted to switch her vials to the [place] office as they are soon to expire. Patient's mother expressed that her work schedule was an issue and that later hours would be helpful. I called asking patient to call our office back to discuss the [place] hours and to see if the [place] location would help.

## 2024-05-01 NOTE — Telephone Encounter (Signed)
 Called and spoke to patient and she is wanting to switch her vials to the [place] office for the time being. She stated that she will get her injections in [place] until she finds a nurse at her job to administer her injection. Vials will be sent over today.

## 2024-05-01 NOTE — Telephone Encounter (Signed)
That is fine with me. Thanks 

## 2024-05-17 NOTE — Progress Notes (Signed)
 Patient presents today to pick up custom molded foot orthotics, diagnosed with BIL foot Deformity by Dr. Lydia Sams.   Orthotics were dispensed and fit was satisfactory. Reviewed instructions for break-in and wear. Written instructions given to patient.  Patient will follow up as needed.   Britton Cane Cped, CFo, CFm
